# Patient Record
Sex: Female | Born: 1960 | Race: White | Hispanic: No | Marital: Married | Smoking: Former smoker
Health system: Southern US, Community
[De-identification: ages and names within clinical notes are randomized; demographics above are authoritative.]

## PROBLEM LIST (undated history)

## (undated) ENCOUNTER — Ambulatory Visit: Source: Home / Self Care

## (undated) DIAGNOSIS — N979 Female infertility, unspecified: Secondary | ICD-10-CM

## (undated) DIAGNOSIS — L509 Urticaria, unspecified: Secondary | ICD-10-CM

## (undated) DIAGNOSIS — S62109A Fracture of unspecified carpal bone, unspecified wrist, initial encounter for closed fracture: Secondary | ICD-10-CM

## (undated) DIAGNOSIS — Z8739 Personal history of other diseases of the musculoskeletal system and connective tissue: Secondary | ICD-10-CM

## (undated) DIAGNOSIS — G4730 Sleep apnea, unspecified: Secondary | ICD-10-CM

## (undated) DIAGNOSIS — D499 Neoplasm of unspecified behavior of unspecified site: Secondary | ICD-10-CM

## (undated) DIAGNOSIS — H9190 Unspecified hearing loss, unspecified ear: Secondary | ICD-10-CM

## (undated) DIAGNOSIS — I872 Venous insufficiency (chronic) (peripheral): Secondary | ICD-10-CM

## (undated) DIAGNOSIS — F419 Anxiety disorder, unspecified: Secondary | ICD-10-CM

## (undated) DIAGNOSIS — M419 Scoliosis, unspecified: Secondary | ICD-10-CM

## (undated) DIAGNOSIS — S92909A Unspecified fracture of unspecified foot, initial encounter for closed fracture: Secondary | ICD-10-CM

## (undated) DIAGNOSIS — M7918 Myalgia, other site: Secondary | ICD-10-CM

## (undated) DIAGNOSIS — M8580 Other specified disorders of bone density and structure, unspecified site: Secondary | ICD-10-CM

## (undated) HISTORY — DX: Personal history of other diseases of the musculoskeletal system and connective tissue: Z87.39

## (undated) HISTORY — DX: Other specified disorders of bone density and structure, unspecified site: M85.80

## (undated) HISTORY — DX: Neoplasm of unspecified behavior of unspecified site: D49.9

## (undated) HISTORY — DX: Urticaria, unspecified: L50.9

## (undated) HISTORY — PX: CYSTECTOMY: SUR359

## (undated) HISTORY — DX: Anxiety disorder, unspecified: F41.9

## (undated) HISTORY — DX: Fracture of unspecified carpal bone, unspecified wrist, initial encounter for closed fracture: S62.109A

## (undated) HISTORY — PX: VEIN LIGATION AND STRIPPING: SHX2653

## (undated) HISTORY — PX: DILATION AND CURETTAGE OF UTERUS: SHX78

## (undated) HISTORY — DX: Scoliosis, unspecified: M41.9

## (undated) HISTORY — PX: TONSILLECTOMY: SUR1361

## (undated) HISTORY — DX: Female infertility, unspecified: N97.9

## (undated) HISTORY — PX: BREAST CYST EXCISION: SHX579

## (undated) HISTORY — DX: Unspecified fracture of unspecified foot, initial encounter for closed fracture: S92.909A

## (undated) HISTORY — PX: MOUTH SURGERY: SHX715

---

## 2007-03-25 ENCOUNTER — Emergency Department (HOSPITAL_COMMUNITY): Admission: EM | Admit: 2007-03-25 | Discharge: 2007-03-26 | Payer: Self-pay | Admitting: Emergency Medicine

## 2007-05-17 ENCOUNTER — Other Ambulatory Visit: Admission: RE | Admit: 2007-05-17 | Discharge: 2007-05-17 | Payer: Self-pay | Admitting: Obstetrics and Gynecology

## 2008-06-19 ENCOUNTER — Other Ambulatory Visit: Admission: RE | Admit: 2008-06-19 | Discharge: 2008-06-19 | Payer: Self-pay | Admitting: Obstetrics and Gynecology

## 2010-05-20 HISTORY — PX: FOOT SURGERY: SHX648

## 2010-12-24 ENCOUNTER — Encounter (INDEPENDENT_AMBULATORY_CARE_PROVIDER_SITE_OTHER): Payer: BC Managed Care – PPO | Admitting: Vascular Surgery

## 2010-12-24 DIAGNOSIS — I83893 Varicose veins of bilateral lower extremities with other complications: Secondary | ICD-10-CM

## 2010-12-25 NOTE — Consult Note (Signed)
NEW PATIENT CONSULTATION  Kristin Bass, Kristin Bass DOB:  02-14-1961                                       12/24/2010 ZOXWR#:[id number]  HISTORY OF PRESENT ILLNESS:  The patient presents today for evaluation of lower extremity discomfort.  She is a very pleasant, healthy, 50 year old female with recurrent varicose veins, bilateral lower extremities. She had treatment in [place], [place] 10 many years ago, she felt that she had some vein stripping although this apparently is not the case on physical exam.  She reports that she had what sounds like potential superficial thrombophlebitis at that time and treatment related to this.  She is certainly not clear on the details of these treatments.  PAST MEDICAL HISTORY:  Negative for any major medical difficulties.  She does report discomfort over these varicosities.  Also reports a twitching sensation and an aching in her calves.  PAST SURGICAL HISTORY:  Recent foot surgery on the left and a history of D and Cs, and tooth extraction.  SOCIAL HISTORY:  She is married.  She quit smoking 20 years ago.  She does not drink alcohol.  FAMILY HISTORY:  Negative for premature cirrhotic disease.  REVIEW OF SYSTEMS:  No weight loss or gain.  She is 5 feet 8 inches tall. VASCULAR:  Positive for pain with walking and prolonged standing. NEURO:  She does have a history of anxiety. Review of systems otherwise negative.  PHYSICAL EXAMINATION:  General:  Well-developed, well-nourished white female appearing stated age in no acute distress.  Vital Signs:  Blood pressure 149/60, pulse 72, respirations 18.  HEENT:  Normal. Extremities:  She has 2+ radial and 2+ dorsalis pedis pulses. Musculoskeletal:  No major deformities or cyanosis.  Neurologic:  No focal weakness or paresthesias.  Skin:  Without ulcers or rash. 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  ASSESSMENT AND PLAN:  She underwent formal venous duplex.  She underwent a screening duplex ultrasound by myself with the [employer].  This does not appear to show any communication between her saphenous vein and these varicosities.  I had a long discussion with her and I explained that these are not dangerous and do not present any severe health risk. She is having discomfort related to this.  Therefore, we have given her a prescription today for 20 to 30 mmHg thigh-high graduated compression garments.  I have instructed her on the use of these.  We plan to see her again in 3 months for formal duplex and continued discussion to determine if she would potentially have a treatment option available. We will discuss this further in 3 months.    Larina Earthly, M.D. Electronically Signed  TFE/MEDQ  D:  12/24/2010  T:  12/25/2010  Job:  [id number]

## 2011-03-25 ENCOUNTER — Ambulatory Visit: Payer: BC Managed Care – PPO | Admitting: Vascular Surgery

## 2013-01-04 ENCOUNTER — Encounter: Payer: Self-pay | Admitting: Certified Nurse Midwife

## 2013-01-09 ENCOUNTER — Ambulatory Visit (INDEPENDENT_AMBULATORY_CARE_PROVIDER_SITE_OTHER): Payer: BC Managed Care – PPO | Admitting: Certified Nurse Midwife

## 2013-01-09 ENCOUNTER — Encounter: Payer: Self-pay | Admitting: Certified Nurse Midwife

## 2013-01-09 VITALS — BP 104/64 | HR 68 | Resp 16 | Ht 68.5 in | Wt 186.0 lb

## 2013-01-09 DIAGNOSIS — Z01419 Encounter for gynecological examination (general) (routine) without abnormal findings: Secondary | ICD-10-CM

## 2013-01-09 DIAGNOSIS — N951 Menopausal and female climacteric states: Secondary | ICD-10-CM

## 2013-01-09 DIAGNOSIS — Z0000 Encounter for general adult medical examination without abnormal findings: Secondary | ICD-10-CM

## 2013-01-09 LAB — POCT URINALYSIS DIPSTICK
Bilirubin, UA: NEGATIVE
Blood, UA: NEGATIVE
Glucose, UA: NEGATIVE
Ketones, UA: NEGATIVE
Leukocytes, UA: NEGATIVE
Nitrite, UA: NEGATIVE
Protein, UA: NEGATIVE
Urobilinogen, UA: NEGATIVE
pH, UA: 5

## 2013-01-09 NOTE — Progress Notes (Signed)
52 y.o. [id number] Single Caucasian Fe here for annual exam.  Menopausal, no HRT.  Reports no vaginal bleeding or vaginal dryness. No menses or spotting since 01-18-12. Has stopped sexual activity with spouse due cooperation issues.  She has tried vibrator and has no pain, it is only with his ED device pump.  Currently in physical therapy for back, working well.  Sees PCP for aex, labs and medication management.  No other health issues today.  Patient's last menstrual period was 01/18/2012.          Sexually active: no  The current method of family planning is vasectomy.    Exercising: yes  pool therapy Smoker:  no  Health Maintenance: Pap:  10-20-11 neg HPV HR neg MMG:  01-14-12 normal Colonoscopy:  01-27-12 BMD:   none TDaP:  2014 Labs: Poct urine-neg Self breast exam: not done   reports that she has quit smoking. She does not have any smokeless tobacco history on file. She reports that she does not drink alcohol or use illicit drugs.  Past Medical History  Diagnosis Date  . Anxiety   . Wrist fracture   . Infertility, female     Past Surgical History  Procedure Laterality Date  . Breast cyst excision Left   . Foot surgery Left 11/11    hammer toe & plantar fascitis  . Dilation and curettage of uterus      times 2  . Cystectomy      from neck  . Vein ligation and stripping      Current Outpatient Prescriptions  Medication Sig Dispense Refill  . CALCIUM PO Take 1,200 mg by mouth daily. Plus vitamin d      . Chlorpheniramine Maleate (ALLERGY PO) Take by mouth daily.      . citalopram (CELEXA) 10 MG tablet Take 10 mg by mouth daily.      Tery Sanfilippo Calcium (STOOL SOFTENER PO) Take by mouth daily.       . Fiber TABS Take by mouth daily.       . Fluticasone Propionate (FLONASE NA) Place into the nose as needed.       . Glucosamine HCl (GLUCOSAMINE PO) Take by mouth daily.      . montelukast (SINGULAIR) 10 MG tablet daily.      . Omega-3 Fatty Acids (FISH OIL PO) Take by mouth  daily.       No current facility-administered medications for this visit.    Family History  Problem Relation Age of Onset  . Cancer Mother     colon  . Hypertension Father   . Anxiety disorder Father   . Diabetes Maternal Grandfather     ROS:  Pertinent items are noted in HPI.  Otherwise, a comprehensive ROS was negative.  Exam:   BP 104/64  Pulse 68  Resp 16  Ht 5' 8.5" (1.74 m)  Wt 186 lb (84.369 kg)  BMI 27.87 kg/m2  LMP 01/18/2012 Height: 5' 8.5" (174 cm)  Ht Readings from Last 3 Encounters:  01/09/13 5' 8.5" (1.74 m)    General appearance: alert, cooperative and appears stated age Head: Normocephalic, without obvious abnormality, atraumatic Neck: no adenopathy, supple, symmetrical, trachea midline and thyroid normal to inspection and palpation Lungs: clear to auscultation bilaterally Breasts: normal appearance, no masses or tenderness, No nipple retraction or dimpling, No nipple discharge or bleeding, No axillary or supraclavicular adenopathy Heart: regular rate and rhythm Abdomen: soft, non-tender; no masses,  no organomegaly Extremities: extremities normal, atraumatic, no  cyanosis or edema Skin: Skin color, texture, turgor normal. No rashes or lesions Lymph nodes: Cervical, supraclavicular, and axillary nodes normal. No abnormal inguinal nodes palpated Neurologic: Grossly normal   Pelvic: External genitalia:  no lesions              Urethra:  normal appearing urethra with no masses, tenderness or lesions              Bartholin's and Skene's: normal                 Vagina: normal appearing vagina with normal color and discharge, no lesions              Cervix: normal, non tender              Pap taken: no Bimanual Exam:  Uterus:  normal size, contour, position, consistency, mobility, non-tender and anteverted              Adnexa: normal adnexa and no mass, fullness, tenderness               Rectovaginal: Confirms               Anus:  normal sphincter tone, no  lesions  A:  Well Woman with normal exam  Menopausal  Back strain under physical therapy  Now  Social Stress with spouse  History of hyperplastic colon polyp colonscopy scheduled.   Family history of colon cancer, mother  P:  Reviewed health and wellness pertinent to exam  Discussed importance of evaluation if vaginal bleeding now will check [facility]  Continue follow up as indicated  Keep appoint for colonscopy  Discussed possible counseling for social stress, patient will decide   Pap smear as per guidelines   Mammogram yearly pap smear not taken today  counseled on breast self exam, mammography screening, menopause, adequate intake of calcium and vitamin D, diet and exercise, Kegel's exercises  return annually or prn  An After Visit Summary was printed and given to the patient.  Reviewed, TL

## 2013-01-09 NOTE — Patient Instructions (Signed)

## 2013-01-10 LAB — FOLLICLE STIMULATING HORMONE: FSH: 106.9 m[IU]/mL

## 2013-01-23 ENCOUNTER — Encounter (HOSPITAL_COMMUNITY): Payer: Self-pay | Admitting: Emergency Medicine

## 2013-01-23 ENCOUNTER — Emergency Department (HOSPITAL_COMMUNITY)
Admission: EM | Admit: 2013-01-23 | Discharge: 2013-01-23 | Disposition: A | Discharge status: Home / Self Care | Payer: BC Managed Care – PPO | Attending: Emergency Medicine | Admitting: Emergency Medicine

## 2013-01-23 DIAGNOSIS — M549 Dorsalgia, unspecified: Secondary | ICD-10-CM | POA: Insufficient documentation

## 2013-01-23 DIAGNOSIS — R209 Unspecified disturbances of skin sensation: Secondary | ICD-10-CM | POA: Insufficient documentation

## 2013-01-23 DIAGNOSIS — R42 Dizziness and giddiness: Secondary | ICD-10-CM | POA: Insufficient documentation

## 2013-01-23 DIAGNOSIS — M79609 Pain in unspecified limb: Secondary | ICD-10-CM | POA: Insufficient documentation

## 2013-01-23 DIAGNOSIS — F411 Generalized anxiety disorder: Secondary | ICD-10-CM | POA: Insufficient documentation

## 2013-01-23 DIAGNOSIS — M6281 Muscle weakness (generalized): Secondary | ICD-10-CM | POA: Insufficient documentation

## 2013-01-23 DIAGNOSIS — Z881 Allergy status to other antibiotic agents status: Secondary | ICD-10-CM | POA: Insufficient documentation

## 2013-01-23 DIAGNOSIS — N951 Menopausal and female climacteric states: Secondary | ICD-10-CM | POA: Insufficient documentation

## 2013-01-23 DIAGNOSIS — Z87891 Personal history of nicotine dependence: Secondary | ICD-10-CM | POA: Insufficient documentation

## 2013-01-23 DIAGNOSIS — M79604 Pain in right leg: Secondary | ICD-10-CM

## 2013-01-23 DIAGNOSIS — Z79899 Other long term (current) drug therapy: Secondary | ICD-10-CM | POA: Insufficient documentation

## 2013-01-23 LAB — CBC
HCT: 41.2 % (ref 36.0–46.0)
Hemoglobin: 14 g/dL (ref 12.0–15.0)
MCH: 31 pg (ref 26.0–34.0)
MCHC: 34 g/dL (ref 30.0–36.0)
MCV: 91.4 fL (ref 78.0–100.0)
Platelets: 218 10*3/uL (ref 150–400)
RBC: 4.51 MIL/uL (ref 3.87–5.11)
RDW: 12.7 % (ref 11.5–15.5)
WBC: 5 10*3/uL (ref 4.0–10.5)

## 2013-01-23 LAB — COMPREHENSIVE METABOLIC PANEL
ALT: 21 U/L (ref 0–35)
AST: 20 U/L (ref 0–37)
Albumin: 3.6 g/dL (ref 3.5–5.2)
Alkaline Phosphatase: 84 U/L (ref 39–117)
BUN: 12 mg/dL (ref 6–23)
CO2: 31 mEq/L (ref 19–32)
Calcium: 9.5 mg/dL (ref 8.4–10.5)
Chloride: 107 mEq/L (ref 96–112)
Creatinine, Ser: 0.71 mg/dL (ref 0.50–1.10)
GFR calc Af Amer: >90 mL/min (ref >90)
GFR calc non Af Amer: >90 mL/min (ref >90)
Glucose, Bld: 96 mg/dL (ref 70–99)
Potassium: 4.2 mEq/L (ref 3.5–5.1)
Sodium: 143 mEq/L (ref 135–145)
Total Bilirubin: 0.3 mg/dL (ref 0.3–1.2)
Total Protein: 6.7 g/dL (ref 6.0–8.3)

## 2013-01-23 LAB — CK: Total CK: 85 U/L (ref 7–177)

## 2013-01-23 NOTE — ED Notes (Signed)
  EMS stated, called for dizziness and leg pain, just started menopause with hot flashes.  The dizziness comes and goes.  Pt is wearing compression stockings.

## 2013-01-23 NOTE — ED Provider Notes (Signed)
History    CSN: [id number] Arrival date & time 01/23/13  0909  First MD Initiated Contact with Patient 01/23/13 [phone]     Chief Complaint  Patient presents with  . Dizziness  . Leg Pain   (Consider location/radiation/quality/duration/timing/severity/associated sxs/prior Treatment) HPI Comments: Pt is a 52yo female presenting with dizziness for several days and leg discomfort for several weeks. History is somewhat vague.  Dizziness: started about 4 days ago, comes in waves "off and on" lasting for several seconds. Does not describe room spinning sensation. States she sometimes feels like she is "going to pass out." Able to ambulate. Unclear whether she has palpitations with this or not. Has not fallen or lost consciousness.  Leg discomfort: not truly pain. Pt describes sensation of heaviness, worse with standing/not having her legs put up. Pt describes some "weakness standing up to get off the toilet," occasionally. Some tingling of her feet. Unable to state clearly whether or not she has swelling. Has worn some TED hose at home given by a family member with ?little improvement.  Otherwise feels well. Hx of myofascial pain syndrome with back pain, seeing PT. On Celexa for hormonal imbalance, recently "going through menopause." Has an appointment   The history is provided by the patient. No language interpreter was used.   Past Medical History  Diagnosis Date  . Anxiety   . Wrist fracture   . Infertility, female    Past Surgical History  Procedure Laterality Date  . Breast cyst excision Left   . Foot surgery Left 11/11    hammer toe & plantar fascitis  . Dilation and curettage of uterus      times 2  . Cystectomy      from neck  . Vein ligation and stripping     Family History  Problem Relation Age of Onset  . Cancer Mother     colon  . Hypertension Father   . Anxiety disorder Father   . Diabetes Maternal Grandfather    History  Substance Use Topics  . Smoking status:  Former Games developer  . Smokeless tobacco: Not on file  . Alcohol Use: No   OB History   Grav Para Term Preterm Abortions TAB SAB Ect Mult Living   2    2   2   0     Obstetric Comments   1 adopted children     Review of Systems  Constitutional: Positive for activity change. Negative for fever, chills and appetite change.  HENT: Negative for congestion, sore throat, rhinorrhea and neck stiffness.   Respiratory: Negative for choking, shortness of breath and wheezing.   Cardiovascular: Negative for chest pain and leg swelling.  Gastrointestinal: Negative for nausea, vomiting and abdominal pain.  Endocrine: Negative for polyphagia and polyuria.  Genitourinary: Negative for dysuria, urgency, hematuria, vaginal discharge, difficulty urinating and pelvic pain.  Musculoskeletal: Positive for myalgias and back pain.       Back pain at baseline, "uncomfortable ED bed"  Skin: Negative for rash and wound.  Neurological: Positive for dizziness and weakness. Negative for seizures, syncope, light-headedness and numbness.  Psychiatric/Behavioral: Negative for behavioral problems and confusion.    Allergies  Ciprofloxacin and Macrobid  Home Medications   Current Outpatient Rx  Name  Route  Sig  Dispense  Refill  . Calcium Carbonate-Vitamin D (CALCIUM + D PO)   Oral   Take 1 tablet by mouth daily.         . citalopram (CELEXA) 10 MG tablet  Oral   Take 10 mg by mouth daily.         Tery Sanfilippo Calcium (STOOL SOFTENER PO)   Oral   Take 1 capsule by mouth daily.          . Fiber TABS   Oral   Take 1 tablet by mouth daily.          . Fluticasone Propionate (FLONASE NA)   Nasal   Place 1 spray into the nose daily as needed (allergies).          . Glucosamine HCl (GLUCOSAMINE PO)   Oral   Take 1 tablet by mouth daily.          . montelukast (SINGULAIR) 10 MG tablet   Oral   Take 10 mg by mouth daily.          . Omega-3 Fatty Acids (FISH OIL PO)   Oral   Take 1 capsule  by mouth daily.          Marland Kitchen OVER THE COUNTER MEDICATION   Oral   Take 1 tablet by mouth daily. Allergy medication         . oxymetazoline (AFRIN) 0.05 % nasal spray   Nasal   Place 2 sprays into the nose 2 (two) times daily as needed for congestion.          BP 118/61  Pulse 69  Temp(Src) 97.8 F (36.6 C) (Oral)  Resp 18  SpO2 100%  LMP 01/18/2012 Physical Exam  Nursing note and vitals reviewed. Constitutional: She is oriented to person, place, and time. She appears well-developed and well-nourished. No distress.  HENT:  Head: Normocephalic and atraumatic.  Eyes: Conjunctivae are normal. Pupils are equal, round, and reactive to light.  Neck: Normal range of motion. Neck supple.  Cardiovascular: Normal rate, regular rhythm and normal heart sounds.   No murmur heard. Pulmonary/Chest: Effort normal and breath sounds normal. No respiratory distress. She has no wheezes.  Abdominal: Soft. Bowel sounds are normal. She exhibits no distension. There is no tenderness.  Musculoskeletal: Normal range of motion. She exhibits no edema and no tenderness.       Right hip: She exhibits normal range of motion and normal strength.       Left hip: She exhibits normal range of motion and normal strength.       Right ankle: Normal.       Left ankle: Normal.       Right upper leg: She exhibits no tenderness and no swelling.       Left upper leg: She exhibits no tenderness and no swelling.       Right lower leg: She exhibits no tenderness and no swelling.       Left lower leg: She exhibits no tenderness and no swelling.       Right foot: She exhibits no tenderness and normal capillary refill.       Left foot: She exhibits no tenderness and normal capillary refill.  Normal ROM to all joints of LE, no LE swelling appreciated, no calf redness/tenderness  Neurological: She is alert and oriented to person, place, and time. She has normal strength. She displays no atrophy and no tremor. No cranial  nerve deficit or sensory deficit.  Skin: Skin is warm and dry. No rash noted. She is not diaphoretic. No erythema.  Psychiatric: She has a normal mood and affect. Her behavior is normal.    ED Course  Procedures (including critical care time)  0945: Exam and HPI as above, vague complaints with unremarkable physical exam. Will check CBC, CMP, CK.  1115: Labs unremarkable. Pt still with chronic back pain but no new complaints. Family member suggested gabapentin for leg pain/discomfort, but hesitant to Rx this due to side effects of drowsiness/dizziness. Pt okay with deferring this to PCP.  1130: Pt comfortable going home with close vein specialist follow-up (appt with Dr. Jimmie Molly moved from later this month to tomorrow 10:30 AM). Will defer other med changes to PCP.  Labs Reviewed  CBC  COMPREHENSIVE METABOLIC PANEL  CK   No results found. 1. Dizziness   2. Bilateral leg pain     MDM  52yo female with vague dizziness and bilateral leg discomfort. Labs and vitals all WNL, as above. Arranged close f/u with vein specialist and advised close PCP f/u. Could consider gabapentin as an outpt.  The above was discussed in its entirety with attending ED physician Dr. Radford Pax.   Bobbye Morton, MD  PGY-2, [facility]  Bobbye Morton, MD 01/23/13 [phone]

## 2013-01-24 NOTE — ED Provider Notes (Signed)
I saw and evaluated the patient, reviewed the resident's note and I agree with the findings and plan.   .Face to face Exam:  General:  Awake HEENT:  Atraumatic Resp:  Normal effort Abd:  Nondistended Neuro:No focal weakness   Nelia Shi, MD 01/24/13 1656

## 2013-01-25 ENCOUNTER — Emergency Department (HOSPITAL_COMMUNITY)
Admission: EM | Admit: 2013-01-25 | Discharge: 2013-01-25 | Discharge status: Against Medical Advice | Payer: BC Managed Care – PPO | Attending: Emergency Medicine | Admitting: Emergency Medicine

## 2013-01-25 ENCOUNTER — Encounter (HOSPITAL_COMMUNITY): Payer: Self-pay | Admitting: Emergency Medicine

## 2013-01-25 NOTE — ED Provider Notes (Signed)
Pt placed in room via EPIC but apparently was not in the department Chart entered in error  Joya Gaskins, MD 01/25/13 2001

## 2013-01-25 NOTE — ED Notes (Signed)
Per EMS, Pt started having 10/10 left sided non-radiating chest pain this evening. Pt denies n/v but does have SOB. Pt took one nitro at home and states it came down to an 8/10. Pt did not take ASA because pt states "I can't take it, it makes me bleed." Pt has history of MI in 1989. BP 118/78, HR 80 NSR.

## 2013-01-25 NOTE — ED Notes (Signed)
Pt was put in with EMS information for Pod A room 5 but it was the wrong pt. Information from 19:10-19:58 is for a different pt.

## 2013-05-25 ENCOUNTER — Other Ambulatory Visit: Payer: Self-pay

## 2013-06-12 ENCOUNTER — Emergency Department (HOSPITAL_COMMUNITY)
Admission: EM | Admit: 2013-06-12 | Discharge: 2013-06-12 | Disposition: A | Discharge status: Home / Self Care | Payer: Worker's Compensation | Attending: Emergency Medicine | Admitting: Emergency Medicine

## 2013-06-12 ENCOUNTER — Encounter (HOSPITAL_COMMUNITY): Payer: Self-pay | Admitting: Emergency Medicine

## 2013-06-12 DIAGNOSIS — Z881 Allergy status to other antibiotic agents status: Secondary | ICD-10-CM | POA: Insufficient documentation

## 2013-06-12 DIAGNOSIS — Z79899 Other long term (current) drug therapy: Secondary | ICD-10-CM | POA: Insufficient documentation

## 2013-06-12 DIAGNOSIS — Y9301 Activity, walking, marching and hiking: Secondary | ICD-10-CM | POA: Insufficient documentation

## 2013-06-12 DIAGNOSIS — S63509A Unspecified sprain of unspecified wrist, initial encounter: Secondary | ICD-10-CM | POA: Insufficient documentation

## 2013-06-12 DIAGNOSIS — W19XXXA Unspecified fall, initial encounter: Secondary | ICD-10-CM

## 2013-06-12 DIAGNOSIS — Z87891 Personal history of nicotine dependence: Secondary | ICD-10-CM | POA: Insufficient documentation

## 2013-06-12 DIAGNOSIS — Y92009 Unspecified place in unspecified non-institutional (private) residence as the place of occurrence of the external cause: Secondary | ICD-10-CM | POA: Insufficient documentation

## 2013-06-12 DIAGNOSIS — IMO0002 Reserved for concepts with insufficient information to code with codable children: Secondary | ICD-10-CM | POA: Insufficient documentation

## 2013-06-12 DIAGNOSIS — R6884 Jaw pain: Secondary | ICD-10-CM | POA: Insufficient documentation

## 2013-06-12 DIAGNOSIS — F411 Generalized anxiety disorder: Secondary | ICD-10-CM | POA: Insufficient documentation

## 2013-06-12 DIAGNOSIS — W108XXA Fall (on) (from) other stairs and steps, initial encounter: Secondary | ICD-10-CM | POA: Insufficient documentation

## 2013-06-12 DIAGNOSIS — T07XXXA Unspecified multiple injuries, initial encounter: Secondary | ICD-10-CM

## 2013-06-12 NOTE — ED Notes (Signed)
Pt tumbled down 5-7 steps and did hit head.  No LOC.  Pt feels like right jaw is not the same but it does not hurt.  Pt complains of right knee but it does not hurt

## 2013-06-12 NOTE — ED Provider Notes (Signed)
CSN: [id number]     Arrival date & time 06/12/13  1848 History   First MD Initiated Contact with Patient 06/12/13 2152     Chief Complaint  Patient presents with  . Fall   HPI  History provided by the patient. Patient is a 52 year old female who presents with pain injuries after a fall. Patient states that she was leaving her house on the way to her car and stumbled down the steps. She reports falling down approximately 4 or 5 steps made of cement. She landed primarily on her left upper extremity and left lower leg. She denies significant head injury but did bump her head on the right forehead. There was no LOC. She denies any significant pains. No neck or back pain. She does complain of some pain in her right jaw area. She does not recall hitting the jaw but it does hurt to open and close the mouth. She also complains of pains in her left hand. Pains are slightly worse with making a fist. Denies any swelling or deformities. No numbness. Patient has not used any medications for symptoms. No other aggravating or alleviating factors. No other associated symptoms.    Past Medical History  Diagnosis Date  . Anxiety   . Wrist fracture   . Infertility, female    Past Surgical History  Procedure Laterality Date  . Breast cyst excision Left   . Foot surgery Left 11/11    hammer toe & plantar fascitis  . Dilation and curettage of uterus      times 2  . Cystectomy      from neck  . Vein ligation and stripping     Family History  Problem Relation Age of Onset  . Cancer Mother     colon  . Hypertension Father   . Anxiety disorder Father   . Diabetes Maternal Grandfather    History  Substance Use Topics  . Smoking status: Former Games developer  . Smokeless tobacco: Not on file  . Alcohol Use: No   OB History   Grav Para Term Preterm Abortions TAB SAB Ect Mult Living   2    2   2   0     Obstetric Comments   1 adopted children     Review of Systems  Respiratory: Negative for shortness  of breath.   Cardiovascular: Negative for chest pain.  Musculoskeletal: Negative for back pain and neck pain.  Neurological: Negative for weakness, numbness and headaches.  All other systems reviewed and are negative.    Allergies  Ciprofloxacin; Macrobid; and Septra  Home Medications   Current Outpatient Rx  Name  Route  Sig  Dispense  Refill  . acetaminophen (TYLENOL) 500 MG tablet   Oral   Take 500 mg by mouth every 8 (eight) hours as needed for mild pain.         . Calcium Carbonate-Vitamin D (CALCIUM + D PO)   Oral   Take 1 tablet by mouth daily.         . citalopram (CELEXA) 10 MG tablet   Oral   Take 15 mg by mouth at bedtime.          Tery Sanfilippo Calcium (STOOL SOFTENER PO)   Oral   Take 1 capsule by mouth daily.          Marland Kitchen doxycycline (VIBRAMYCIN) 100 MG capsule   Oral   Take 100 mg by mouth 2 (two) times daily.         Marland Kitchen  Fiber TABS   Oral   Take 1 tablet by mouth daily.          . fluticasone (FLONASE) 50 MCG/ACT nasal spray   Each Nare   Place 1 spray into both nostrils daily as needed for allergies or rhinitis.         . Glucosamine HCl (GLUCOSAMINE PO)   Oral   Take 1 tablet by mouth daily.          . montelukast (SINGULAIR) 10 MG tablet   Oral   Take 10 mg by mouth at bedtime as needed (for allergies).         . Omega-3 Fatty Acids (FISH OIL PO)   Oral   Take 1 capsule by mouth daily.          Marland Kitchen zolpidem (AMBIEN) 5 MG tablet   Oral   Take 2.5 mg by mouth at bedtime as needed for sleep.          BP 120/63  Pulse 71  Temp(Src) 97.7 F (36.5 C) (Oral)  Resp 18  SpO2 98%  LMP 01/18/2012 Physical Exam  Nursing note and vitals reviewed. Constitutional: She is oriented to person, place, and time. She appears well-developed and well-nourished. No distress.  HENT:  Head: Normocephalic.  Mouth/Throat: Oropharynx is clear and moist.  Tenderness to palpation over bilateral TMJs. There is some popping and clicking at the  left TMJ. No facial swelling or deformities. No step-offs. Normal dentition. No trismus.  Small contusion to the right forehead without large hematoma. No step-offs worse concerns for skull fracture  Eyes: Conjunctivae and EOM are normal. Pupils are equal, round, and reactive to light.  Neck: Normal range of motion. Neck supple.  No cervical midline tenderness  Cardiovascular: Normal rate and regular rhythm.   Pulmonary/Chest: Effort normal and breath sounds normal. No stridor. No respiratory distress. She has no wheezes. She has no rales.  Abdominal: Soft. There is no tenderness.  Musculoskeletal: Normal range of motion.  A small abrasion over the posterior left forearm. There is mild contusion to the left hand near the second metacarpal. No swelling or deformities. Normal range of motion of the wrist and digits. Normal distal sensations and capillary refill. No weakness.  Small abrasion and hematoma to that proximal anterior left lower leg and shin. No deformity of tibia or any step-offs. Normal gait. Normal distal pulses and sensation in both feet.  Neurological: She is alert and oriented to person, place, and time. She has normal strength. No cranial nerve deficit or sensory deficit. Gait normal.  Skin: Skin is warm and dry. No rash noted.  Psychiatric: She has a normal mood and affect. Her behavior is normal.    ED Course  Procedures  DIAGNOSTIC STUDIES: Oxygen Saturation is 98% on room air.    COORDINATION OF CARE:  Nursing notes reviewed. Vital signs reviewed. Initial pt interview and examination performed.   10:36 PM- patient seen and evaluated. She appears well with normal nonfocal neuro exam. I discussed options for x-rays of her injured in painful areas including the hand, lower leg and jaw area. At this time she does not wish to have any x-rays performed. There are no significant findings on exam tunicate high concern for fracture. We discussed other treatment options  including rest, ice and anti-inflammatory pain medicine. Patient agrees with this plan and will followup with PCP as needed.      MDM   1. Fall, initial encounter   2. Multiple contusions  3. Abrasions of multiple sites   4. Wrist sprain and strain, left, initial encounter        Angus Seller, PA-C 06/13/13 [phone]

## 2013-06-12 NOTE — ED Notes (Signed)
Orthopedic called.

## 2013-06-15 NOTE — ED Provider Notes (Signed)
Medical screening examination/treatment/procedure(s) were performed by non-physician practitioner and as supervising physician I was immediately available for consultation/collaboration.  EKG Interpretation   None        Avyukt Cimo R. Jenkins Risdon, MD 06/15/13 1056 

## 2013-07-18 ENCOUNTER — Other Ambulatory Visit: Payer: Self-pay | Admitting: Family Medicine

## 2013-07-18 ENCOUNTER — Ambulatory Visit
Admission: RE | Admit: 2013-07-18 | Discharge: 2013-07-18 | Disposition: A | Discharge status: Home / Self Care | Payer: PRIVATE HEALTH INSURANCE | Source: Ambulatory Visit | Attending: Family Medicine | Admitting: Family Medicine

## 2013-07-18 DIAGNOSIS — M79609 Pain in unspecified limb: Secondary | ICD-10-CM

## 2013-11-13 ENCOUNTER — Other Ambulatory Visit: Payer: Self-pay | Admitting: Family Medicine

## 2013-11-13 ENCOUNTER — Ambulatory Visit
Admission: RE | Admit: 2013-11-13 | Discharge: 2013-11-13 | Disposition: A | Discharge status: Home / Self Care | Payer: PRIVATE HEALTH INSURANCE | Source: Ambulatory Visit | Attending: Family Medicine | Admitting: Family Medicine

## 2013-11-13 DIAGNOSIS — M549 Dorsalgia, unspecified: Secondary | ICD-10-CM

## 2013-12-14 ENCOUNTER — Emergency Department (INDEPENDENT_AMBULATORY_CARE_PROVIDER_SITE_OTHER)
Admission: EM | Admit: 2013-12-14 | Discharge: 2013-12-14 | Disposition: A | Discharge status: Home / Self Care | Payer: BC Managed Care – PPO | Source: Home / Self Care | Attending: Emergency Medicine | Admitting: Emergency Medicine

## 2013-12-14 ENCOUNTER — Emergency Department (INDEPENDENT_AMBULATORY_CARE_PROVIDER_SITE_OTHER): Payer: BC Managed Care – PPO

## 2013-12-14 ENCOUNTER — Encounter (HOSPITAL_COMMUNITY): Payer: Self-pay | Admitting: Emergency Medicine

## 2013-12-14 DIAGNOSIS — W010XXA Fall on same level from slipping, tripping and stumbling without subsequent striking against object, initial encounter: Secondary | ICD-10-CM

## 2013-12-14 DIAGNOSIS — S8000XA Contusion of unspecified knee, initial encounter: Secondary | ICD-10-CM

## 2013-12-14 HISTORY — DX: Myalgia, other site: M79.18

## 2013-12-14 MED ORDER — IBUPROFEN 800 MG PO TABS
800.0000 mg | ORAL_TABLET | Freq: Once | ORAL | Status: AC
  Administered 2013-12-14: 800 mg via ORAL

## 2013-12-14 MED ORDER — HYDROCODONE-ACETAMINOPHEN 5-325 MG PO TABS: 1.0000 | ORAL_TABLET | Freq: Four times a day (QID) | ORAL | Status: DC | PRN

## 2013-12-14 MED ORDER — IBUPROFEN 800 MG PO TABS: 800.0000 mg | ORAL_TABLET | Freq: Three times a day (TID) | ORAL | Status: DC

## 2013-12-14 MED ORDER — IBUPROFEN 800 MG PO TABS
ORAL_TABLET | ORAL | Status: AC
  Filled 2013-12-14: qty 1

## 2013-12-14 NOTE — Discharge Instructions (Signed)
Contusion A contusion is a deep bruise. Contusions are the result of an injury that caused bleeding under the skin. The contusion may turn blue, purple, or yellow. Minor injuries will give you a painless contusion, but more severe contusions may stay painful and swollen for a few weeks.  CAUSES  A contusion is usually caused by a blow, trauma, or direct force to an area of the body. SYMPTOMS   Swelling and redness of the injured area.  Bruising of the injured area.  Tenderness and soreness of the injured area.  Pain. DIAGNOSIS  The diagnosis can be made by taking a history and physical exam. An X-ray, CT scan, or MRI may be needed to determine if there were any associated injuries, such as fractures. TREATMENT  Specific treatment will depend on what area of the body was injured. In general, the best treatment for a contusion is resting, icing, elevating, and applying cold compresses to the injured area. Over-the-counter medicines may also be recommended for pain control. Ask your caregiver what the best treatment is for your contusion. HOME CARE INSTRUCTIONS   Put ice on the injured area.  Put ice in a plastic bag.  Place a towel between your skin and the bag.  Leave the ice on for 15-20 minutes, 03-04 times a day.  Only take over-the-counter or prescription medicines for pain, discomfort, or fever as directed by your caregiver. Your caregiver may recommend avoiding anti-inflammatory medicines (aspirin, ibuprofen, and naproxen) for 48 hours because these medicines may increase bruising.  Rest the injured area.  If possible, elevate the injured area to reduce swelling. SEEK IMMEDIATE MEDICAL CARE IF:   You have increased bruising or swelling.  You have pain that is getting worse.  Your swelling or pain is not relieved with medicines. MAKE SURE YOU:   Understand these instructions.  Will watch your condition.  Will get help right away if you are not doing well or get  worse. Document Released: 04/15/2005 Document Revised: 09/28/2011 Document Reviewed: 05/11/2011 [facility] Patient Information 2014 [place], [place].  Knee Effusion The medical term for having fluid in your knee is effusion. This is often due to an internal derangement of the knee. This means something is wrong inside the knee. Some of the causes of fluid in the knee may be torn cartilage, a torn ligament, or bleeding into the joint from an injury. Your knee is likely more difficult to bend and move. This is often because there is increased pain and pressure in the joint. The time it takes for recovery from a knee effusion depends on different factors, including:   Type of injury.  Your age.  Physical and medical conditions.  Rehabilitation Strategies. How long you will be away from your normal activities will depend on what kind of knee problem you have and how much damage is present. Your knee has two types of cartilage. Articular cartilage covers the bone ends and lets your knee bend and move smoothly. Two menisci, thick pads of cartilage that form a rim inside the joint, help absorb shock and stabilize your knee. Ligaments bind the bones together and support your knee joint. Muscles move the joint, help support your knee, and take stress off the joint itself. CAUSES  Often an effusion in the knee is caused by an injury to one of the menisci. This is often a tear in the cartilage. Recovery after a meniscus injury depends on how much meniscus is damaged and whether you have damaged other knee tissue.  Small tears may heal on their own with conservative treatment. Conservative means rest, limited weight bearing activity and muscle strengthening exercises. Your recovery may take up to 6 weeks.  TREATMENT  Larger tears may require surgery. Meniscus injuries may be treated during arthroscopy. Arthroscopy is a procedure in which your surgeon uses a small telescope like instrument to look in your knee.  Your caregiver can make a more accurate diagnosis (learning what is wrong) by performing an arthroscopic procedure. If your injury is on the inner margin of the meniscus, your surgeon may trim the meniscus back to a smooth rim. In other cases your surgeon will try to repair a damaged meniscus with stitches (sutures). This may make rehabilitation take longer, but may provide better long term result by helping your knee keep its shock absorption capabilities. Ligaments which are completely torn usually require surgery for repair. HOME CARE INSTRUCTIONS  Use crutches as instructed.  If a brace is applied, use as directed.  Once you are home, an ice pack applied to your swollen knee may help with discomfort and help decrease swelling.  Keep your knee raised (elevated) when you are not up and around or on crutches.  Only take over-the-counter or prescription medicines for pain, discomfort, or fever as directed by your caregiver.  Your caregivers will help with instructions for rehabilitation of your knee. This often includes strengthening exercises.  You may resume a normal diet and activities as directed. SEEK MEDICAL CARE IF:   There is increased swelling in your knee.  You notice redness, swelling, or increasing pain in your knee.  An unexplained oral temperature above 102 F (38.9 C) develops. SEEK IMMEDIATE MEDICAL CARE IF:   You develop a rash.  You have difficulty breathing.  You have any allergic reactions from medications you may have been given.  There is severe pain with any motion of the knee. MAKE SURE YOU:   Understand these instructions.  Will watch your condition.  Will get help right away if you are not doing well or get worse. Document Released: 09/26/2003 Document Revised: 09/28/2011 Document Reviewed: 11/30/2007 [facility] Patient Information 2014 [place].

## 2013-12-14 NOTE — ED Provider Notes (Signed)
CSN: [id number]     Arrival date & time 12/14/13  1819 History   None    Chief Complaint  Patient presents with  . Fall   (Consider location/radiation/quality/duration/timing/severity/associated sxs/prior Treatment) HPI Comments: 53 year old female presents complaining of knee pain, arm pain, and hip pain. This started just this afternoon. She was walking in a parking lot when she tripped and fell. She fell straight forward onto her hands and knees. She had immediate pain, and she has an abrasion on her right knee and some swelling on her left knee. She says that she has mild fascial pain syndrome and she thinks this will cause a flare up. She is able to walk without difficulty. Her knee is very tender to touch. No twisting or popping in the knee.  Patient is a 53 y.o. female presenting with fall.  Fall    Past Medical History  Diagnosis Date  . Anxiety   . Wrist fracture   . Infertility, female   . Myofascial pain syndrome    Past Surgical History  Procedure Laterality Date  . Breast cyst excision Left   . Foot surgery Left 11/11    hammer toe & plantar fascitis  . Dilation and curettage of uterus      times 2  . Cystectomy      from neck  . Vein ligation and stripping     Family History  Problem Relation Age of Onset  . Cancer Mother     colon  . Hypertension Father   . Anxiety disorder Father   . Diabetes Maternal Grandfather    History  Substance Use Topics  . Smoking status: Former Research scientist (life sciences)  . Smokeless tobacco: Not on file  . Alcohol Use: No   OB History   Grav Para Term Preterm Abortions TAB SAB Ect Mult Living   2    2   2   0     Obstetric Comments   1 adopted children     Review of Systems  Musculoskeletal: Positive for arthralgias, joint swelling and myalgias. Negative for back pain, gait problem, neck pain and neck stiffness.  Neurological: Negative for weakness and numbness.  All other systems reviewed and are negative.   Allergies   Ciprofloxacin; Macrobid; and Septra  Home Medications   Prior to Admission medications   Medication Sig Start Date End Date Taking? Authorizing Provider  Calcium Carbonate-Vitamin D (CALCIUM + D PO) Take 1 tablet by mouth daily.   Yes Historical Provider, MD  citalopram (CELEXA) 10 MG tablet Take 15 mg by mouth at bedtime.    Yes Historical Provider, MD  Docusate Calcium (STOOL SOFTENER PO) Take 1 capsule by mouth daily.    Yes Historical Provider, MD  Fiber TABS Take 1 tablet by mouth daily.    Yes Historical Provider, MD  Glucosamine HCl (GLUCOSAMINE PO) Take 1 tablet by mouth daily.    Yes Historical Provider, MD  montelukast (SINGULAIR) 10 MG tablet Take 10 mg by mouth at bedtime as needed (for allergies).   Yes Historical Provider, MD  acetaminophen (TYLENOL) 500 MG tablet Take 500 mg by mouth every 8 (eight) hours as needed for mild pain.    Historical Provider, MD  doxycycline (VIBRAMYCIN) 100 MG capsule Take 100 mg by mouth 2 (two) times daily.    Historical Provider, MD  fluticasone (FLONASE) 50 MCG/ACT nasal spray Place 1 spray into both nostrils daily as needed for allergies or rhinitis.    Historical Provider, MD  HYDROcodone-acetaminophen [facility])  5-325 MG per tablet Take 1 tablet by mouth every 6 (six) hours as needed for moderate pain. 12/14/13   Liam Graham, PA-C  ibuprofen (ADVIL,MOTRIN) 800 MG tablet Take 1 tablet (800 mg total) by mouth 3 (three) times daily. 12/14/13   Liam Graham, PA-C  Omega-3 Fatty Acids (FISH OIL PO) Take 1 capsule by mouth daily.     Historical Provider, MD  zolpidem (AMBIEN) 5 MG tablet Take 2.5 mg by mouth at bedtime as needed for sleep.    Historical Provider, MD   BP 105/65  Pulse 77  Temp(Src) 97 F (36.1 C) (Oral)  Resp 16  LMP 01/18/2012 Physical Exam  Nursing note and vitals reviewed. Constitutional: She is oriented to person, place, and time. Vital signs are normal. She appears well-developed and well-nourished. No distress.   HENT:  Head: Normocephalic and atraumatic.  Pulmonary/Chest: Effort normal. No respiratory distress.  Musculoskeletal:       Right wrist: Normal.       Left wrist: Normal.       Right knee: She exhibits laceration (Very mild abrasion on the inferior medial portion of the knee).       Left knee: She exhibits swelling (medial inferior portion of the knee) and effusion (Very minimal). She exhibits normal range of motion. Tenderness found. Patellar tendon tenderness noted.       Right hand: Normal.       Left hand: Normal.  Neurological: She is alert and oriented to person, place, and time. She has normal strength. Coordination normal.  Skin: Skin is warm and dry. No rash noted. She is not diaphoretic.  Psychiatric: She has a normal mood and affect. Judgment normal.    ED Course  Procedures (including critical care time) Labs Review Labs Reviewed - No data to display  Imaging Review Dg Knee Complete 4 Views Left  12/14/2013   CLINICAL DATA:  Fall, left knee pain  EXAM: LEFT KNEE - COMPLETE 4+ VIEW  COMPARISON:  None.  FINDINGS: Four views of the left knee submitted. No acute fracture or subluxation. No radiopaque foreign body.  IMPRESSION: Negative.   Electronically Signed   By: Lahoma Crocker M.D.   On: 12/14/2013 19:53     MDM   1. Knee contusion    Because of the small effusion, we will x-ray the left knee. The x-rays normal. Treat symptomatically. She has an orthopedic physician that she can followup with if she does not get better. She was given 800 mg ibuprofen here   Meds ordered this encounter  Medications  . ibuprofen (ADVIL,MOTRIN) tablet 800 mg    Sig:   . ibuprofen (ADVIL,MOTRIN) 800 MG tablet    Sig: Take 1 tablet (800 mg total) by mouth 3 (three) times daily.    Dispense:  30 tablet    Refill:  0    Order Specific Question:  Supervising Provider    Answer:  Billy Fischer [phone]  . HYDROcodone-acetaminophen (NORCO) 5-325 MG per tablet    Sig: Take 1 tablet by mouth  every 6 (six) hours as needed for moderate pain.    Dispense:  15 tablet    Refill:  0    Order Specific Question:  Supervising Provider    Answer:  Ihor Gully D [place], PA-C 12/14/13 [phone]

## 2013-12-14 NOTE — ED Notes (Addendum)
C/O tripping over pipes at daughter's school; "sliding" on bilat knees and bilat hands.  C/O pain to bilat knees, HA (but denies hitting head), generalized soreness.  No abrasions noted.

## 2013-12-14 NOTE — ED Provider Notes (Signed)
Medical screening examination/treatment/procedure(s) were performed by non-physician practitioner and as supervising physician I was immediately available for consultation/collaboration.  Philipp Deputy, M.D.   Harden Mo, MD 12/14/13 2103

## 2014-01-05 ENCOUNTER — Other Ambulatory Visit: Payer: Self-pay | Admitting: Family Medicine

## 2014-01-05 DIAGNOSIS — M545 Low back pain: Secondary | ICD-10-CM

## 2014-01-08 ENCOUNTER — Ambulatory Visit
Admission: RE | Admit: 2014-01-08 | Discharge: 2014-01-08 | Disposition: A | Discharge status: Home / Self Care | Payer: PRIVATE HEALTH INSURANCE | Source: Ambulatory Visit | Attending: Family Medicine | Admitting: Family Medicine

## 2014-01-08 DIAGNOSIS — M545 Low back pain: Secondary | ICD-10-CM

## 2014-03-03 ENCOUNTER — Emergency Department (HOSPITAL_COMMUNITY)
Admission: EM | Admit: 2014-03-03 | Discharge: 2014-03-03 | Disposition: A | Discharge status: Home / Self Care | Payer: BC Managed Care – PPO | Attending: Emergency Medicine | Admitting: Emergency Medicine

## 2014-03-03 ENCOUNTER — Encounter (HOSPITAL_COMMUNITY): Payer: Self-pay | Admitting: Emergency Medicine

## 2014-03-03 DIAGNOSIS — Z79899 Other long term (current) drug therapy: Secondary | ICD-10-CM | POA: Insufficient documentation

## 2014-03-03 DIAGNOSIS — Z8781 Personal history of (healed) traumatic fracture: Secondary | ICD-10-CM | POA: Insufficient documentation

## 2014-03-03 DIAGNOSIS — Z87891 Personal history of nicotine dependence: Secondary | ICD-10-CM | POA: Insufficient documentation

## 2014-03-03 DIAGNOSIS — F411 Generalized anxiety disorder: Secondary | ICD-10-CM | POA: Diagnosis not present

## 2014-03-03 DIAGNOSIS — Z791 Long term (current) use of non-steroidal anti-inflammatories (NSAID): Secondary | ICD-10-CM | POA: Insufficient documentation

## 2014-03-03 DIAGNOSIS — Z792 Long term (current) use of antibiotics: Secondary | ICD-10-CM | POA: Insufficient documentation

## 2014-03-03 DIAGNOSIS — IMO0001 Reserved for inherently not codable concepts without codable children: Secondary | ICD-10-CM | POA: Insufficient documentation

## 2014-03-03 DIAGNOSIS — G8929 Other chronic pain: Secondary | ICD-10-CM | POA: Diagnosis not present

## 2014-03-03 DIAGNOSIS — M549 Dorsalgia, unspecified: Secondary | ICD-10-CM | POA: Diagnosis present

## 2014-03-03 DIAGNOSIS — M25559 Pain in unspecified hip: Secondary | ICD-10-CM | POA: Diagnosis not present

## 2014-03-03 LAB — URINALYSIS, ROUTINE W REFLEX MICROSCOPIC
Bilirubin Urine: NEGATIVE
Glucose, UA: NEGATIVE mg/dL
Hgb urine dipstick: NEGATIVE
Ketones, ur: NEGATIVE mg/dL
Leukocytes, UA: NEGATIVE
Nitrite: NEGATIVE
Protein, ur: NEGATIVE mg/dL
Specific Gravity, Urine: 1.014 (ref 1.005–1.030)
Urobilinogen, UA: 0.2 mg/dL (ref 0.0–1.0)
pH: 7 (ref 5.0–8.0)

## 2014-03-03 MED ORDER — CYCLOBENZAPRINE HCL 10 MG PO TABS: 10.0000 mg | ORAL_TABLET | Freq: Three times a day (TID) | ORAL | Status: DC | PRN

## 2014-03-03 MED ORDER — IBUPROFEN 400 MG PO TABS
600.0000 mg | ORAL_TABLET | Freq: Once | ORAL | Status: AC
  Administered 2014-03-03: 600 mg via ORAL
  Filled 2014-03-03 (×2): qty 1

## 2014-03-03 MED ORDER — NAPROXEN 500 MG PO TABS: 500.0000 mg | ORAL_TABLET | Freq: Two times a day (BID) | ORAL | Status: DC | PRN

## 2014-03-03 MED ORDER — HYDROCODONE-ACETAMINOPHEN 5-325 MG PO TABS: 1.0000 | ORAL_TABLET | Freq: Four times a day (QID) | ORAL | Status: DC | PRN

## 2014-03-03 NOTE — Discharge Instructions (Signed)
Back Pain:  Your back pain should be treated with medicines such as ibuprofen or aleve and this back pain should get better over the next 2 weeks.  However if you develop severe or worsening pain, low back pain with fever, numbness, weakness or inability to walk or urinate, you should return to the ER immediately.  Please follow up with your doctor this week for a recheck if still having symptoms.  Low back pain is discomfort in the lower back that may be due to injuries to muscles and ligaments around the spine.  Occasionally, it may be caused by a a problem to a part of the spine called a disc.  The pain may last several days or a week;  However, most patients get completely well in 4 weeks.  Self - care:  The application of heat can help soothe the pain.  Maintaining your daily activities, including walking, is encourged, as it will help you get better faster than just staying in bed. Perform gentle stretching as discussed. Drink plenty of fluids.  Medications are also useful to help with pain control.  A commonly prescribed medications includes norco. Do not drive or operate heavy machinery while taking this medication. It will make you drowsy.  Non steroidal anti inflammatory medications including Ibuprofen and naproxen;  These medications help both pain and swelling and are very useful in treating back pain.  They should be taken with food, as they can cause stomach upset, and more seriously, stomach bleeding.    Muscle relaxants (flexeril):  These medications can help with muscle tightness that is a cause of lower back pain.  Most of these medications can cause drowsiness, and it is not safe to drive or use dangerous machinery while taking them.  SEEK IMMEDIATE MEDICAL ATTENTION IF: New numbness, tingling, weakness, or problem with the use of your arms or legs.  Severe back pain not relieved with medications.  Difficulty with or loss of control of your bowel or bladder control.  Increasing  pain in any areas of the body (such as chest or abdominal pain).  Shortness of breath, dizziness or fainting.  Nausea (feeling sick to your stomach), vomiting, fever, or sweats.  You will need to follow up with  Your primary healthcare provider in 1-2 weeks for reassessment.   Back Pain, Adult Low back pain is very common. About 1 in 5 people have back pain.The cause of low back pain is rarely dangerous. The pain often gets better over time.About half of people with a sudden onset of back pain feel better in just 2 weeks. About 8 in 10 people feel better by 6 weeks.  CAUSES Some common causes of back pain include:  Strain of the muscles or ligaments supporting the spine.  Wear and tear (degeneration) of the spinal discs.  Arthritis.  Direct injury to the back. DIAGNOSIS Most of the time, the direct cause of low back pain is not known.However, back pain can be treated effectively even when the exact cause of the pain is unknown.Answering your caregiver's questions about your overall health and symptoms is one of the most accurate ways to make sure the cause of your pain is not dangerous. If your caregiver needs more information, he or she may order lab work or imaging tests (X-rays or MRIs).However, even if imaging tests show changes in your back, this usually does not require surgery. HOME CARE INSTRUCTIONS For many people, back pain returns.Since low back pain is rarely dangerous, it is often  a condition that people can learn to [facility] their own.   Remain active. It is stressful on the back to sit or stand in one place. Do not sit, drive, or stand in one place for more than 30 minutes at a time. Take short walks on level surfaces as soon as pain allows.Try to increase the length of time you walk each day.  Do not stay in bed.Resting more than 1 or 2 days can delay your recovery.  Do not avoid exercise or work.Your body is made to move.It is not dangerous to be active, even  though your back may hurt.Your back will likely heal faster if you return to being active before your pain is gone.  Pay attention to your body when you bend and lift. Many people have less discomfortwhen lifting if they bend their knees, keep the load close to their bodies,and avoid twisting. Often, the most comfortable positions are those that put less stress on your recovering back.  Find a comfortable position to sleep. Use a firm mattress and lie on your side with your knees slightly bent. If you lie on your back, put a pillow under your knees.  Only take over-the-counter or prescription medicines as directed by your caregiver. Over-the-counter medicines to reduce pain and inflammation are often the most helpful.Your caregiver may prescribe muscle relaxant drugs.These medicines help dull your pain so you can more quickly return to your normal activities and healthy exercise.  Put ice on the injured area.  Put ice in a plastic bag.  Place a towel between your skin and the bag.  Leave the ice on for 15-20 minutes, 03-04 times a day for the first 2 to 3 days. After that, ice and heat may be alternated to reduce pain and spasms.  Ask your caregiver about trying back exercises and gentle massage. This may be of some benefit.  Avoid feeling anxious or stressed.Stress increases muscle tension and can worsen back pain.It is important to recognize when you are anxious or stressed and learn ways to manage it.Exercise is a great option. SEEK MEDICAL CARE IF:  You have pain that is not relieved with rest or medicine.  You have pain that does not improve in 1 week.  You have new symptoms.  You are generally not feeling well. SEEK IMMEDIATE MEDICAL CARE IF:   You have pain that radiates from your back into your legs.  You develop new bowel or bladder control problems.  You have unusual weakness or numbness in your arms or legs.  You develop nausea or vomiting.  You develop  abdominal pain.  You feel faint. Document Released: 07/06/2005 Document Revised: 01/05/2012 Document Reviewed: 11/07/2013 [facility] Patient Information 2015 [place], [place]. This information is not intended to replace advice given to you by your health care provider. Make sure you discuss any questions you have with your health care provider.  Heat Therapy Heat therapy can help make painful, stiff muscles and joints feel better. Do not use heat on new injuries. Wait at least 48 hours after an injury to use heat. Do not use heat when you have aches or pains right after an activity. If you still have pain 3 hours after stopping the activity, then you may use heat. HOME CARE Wet heat pack  Soak a clean towel in warm water. Squeeze out the extra water.  Put the warm, wet towel in a plastic bag.  Place a thin, dry towel between your skin and the bag.  Put the  heat pack on the area for 5 minutes, and check your skin. Your skin may be pink, but it should not be red.  Leave the heat pack on the area for 15 to 30 minutes.  Repeat this every 2 to 4 hours while awake. Do not use heat while you are sleeping. Warm water bath  Fill a tub with warm water.  Place the affected body part in the tub.  Soak the area for 20 to 40 minutes.  Repeat as needed. Hot water bottle  Fill the water bottle half full with hot water.  Press out the extra air. Close the cap tightly.  Place a dry towel between your skin and the bottle.  Put the bottle on the area for 5 minutes, and check your skin. Your skin may be pink, but it should not be red.  Leave the bottle on the area for 15 to 30 minutes.  Repeat this every 2 to 4 hours while awake. Electric heating pad  Place a dry towel between your skin and the heating pad.  Set the heating pad on low heat.  Put the heating pad on the area for 10 minutes, and check your skin. Your skin may be pink, but it should not be red.  Leave the heating pad on the  area for 20 to 40 minutes.  Repeat this every 2 to 4 hours while awake.  Do not lie on the heating pad.  Do not fall asleep while using the heating pad.  Do not use the heating pad near water. GET HELP RIGHT AWAY IF:  You get blisters or red skin.  Your skin is puffy (swollen), or you lose feeling (numbness) in the affected area.  You have any new problems.  Your problems are getting worse.  You have any questions or concerns. If you have any problems, stop using heat therapy until you see your doctor. MAKE SURE YOU:  Understand these instructions.  Will watch your condition.  Will get help right away if you are not doing well or get worse. Document Released: 09/28/2011 Document Reviewed: 08/29/2013 [facility] Patient Information 2015 [place]. This information is not intended to replace advice given to you by your health care provider. Make sure you discuss any questions you have with your health care provider.

## 2014-03-03 NOTE — ED Provider Notes (Signed)
CSN: [id number]     Arrival date & time 03/03/14  1126 History  This chart was scribed for non-physician practitioner Zacarias Pontes, PA-C working with Richarda Blade, MD by Ludger Nutting, ED Scribe. This patient was seen in room TR05C/TR05C and the patient's care was started at 12:22 PM.    Chief Complaint  Patient presents with  . Back Pain    Patient is a 53 y.o. female presenting with back pain. The history is provided by the patient. No language interpreter was used.  Back Pain Location:  Lumbar spine Quality:  Stiffness Radiates to:  L posterior upper leg, R posterior upper leg, L thigh and R thigh Pain severity:  Moderate Pain is:  Same all the time Duration:  1 day Timing:  Intermittent Chronicity:  Recurrent Context: not falling, not lifting heavy objects and not recent injury   Relieved by:  Being still Worsened by:  Bending, movement, standing, sitting and twisting Ineffective treatments:  Bowel activity and OTC medications Associated symptoms: no abdominal pain, no bladder incontinence, no bowel incontinence, no chest pain, no dysuria, no headaches, no paresthesias, no tingling and no weakness     HPI Comments: Kristin Bass is a 53 y.o. female who presents to the Emergency Department complaining of chronic, ongoing, intermittent episodes of lower back pain with occasional radiation to the bilateral posterior and lateral thighs and bilateral hips that acutely worsened yesterday. She reports having these episodes for the past several weeks but this most recent episode began this morning when she had difficulty getting out of bed. Patient states her current pain is mild but increases in severity with certain movements such as standing and sitting. She describes the pain as stiffness to both the lower back as well as the thighs. Patient states the pain is usually relieved with a BM but was unable to have one this morning, but continues to pass flatus and states she had several  BMs yesterday. She denies recent falls, injuries, or trauma to the affected area. She reports having an XRAY and MRI which showed degenerative disc disease of the lumbar spine and hip bursitis. She denies dysuria, groin pain, abdominal pain, urine changes, upper back pain/flank pain, lower extremity numbness or tingling, HA, blurry vision, chest pain, SOB, bowel/bladder incontinence, paresthesias, or weakness.   Past Medical History  Diagnosis Date  . Anxiety   . Wrist fracture   . Infertility, female   . Myofascial pain syndrome    Past Surgical History  Procedure Laterality Date  . Breast cyst excision Left   . Foot surgery Left 11/11    hammer toe & plantar fascitis  . Dilation and curettage of uterus      times 2  . Cystectomy      from neck  . Vein ligation and stripping     Family History  Problem Relation Age of Onset  . Cancer Mother     colon  . Hypertension Father   . Anxiety disorder Father   . Diabetes Maternal Grandfather    History  Substance Use Topics  . Smoking status: Former Research scientist (life sciences)  . Smokeless tobacco: Not on file  . Alcohol Use: No   OB History   Grav Para Term Preterm Abortions TAB SAB Ect Mult Living   2    2   2   0     Obstetric Comments   1 adopted children     Review of Systems  Eyes: Negative for visual disturbance.  Respiratory: Negative  for shortness of breath.   Cardiovascular: Negative for chest pain.  Gastrointestinal: Negative for nausea, vomiting, abdominal pain and bowel incontinence.  Genitourinary: Negative for bladder incontinence, dysuria and frequency.  Musculoskeletal: Positive for back pain.  Neurological: Negative for tingling, weakness, headaches and paresthesias.  10 Systems reviewed and are negative for acute change except as noted in the HPI.     Allergies  Ciprofloxacin; Macrobid; and Septra  Home Medications   Prior to Admission medications   Medication Sig Start Date End Date Taking? Authorizing Provider   acetaminophen (TYLENOL) 500 MG tablet Take 500 mg by mouth every 8 (eight) hours as needed for mild pain.    Historical Provider, MD  Calcium Carbonate-Vitamin D (CALCIUM + D PO) Take 1 tablet by mouth daily.    Historical Provider, MD  citalopram (CELEXA) 10 MG tablet Take 15 mg by mouth at bedtime.     Historical Provider, MD  cyclobenzaprine (FLEXERIL) 10 MG tablet Take 1 tablet (10 mg total) by mouth 3 (three) times daily as needed for muscle spasms. 03/03/14   Kammi Hechler Strupp Camprubi-Soms, PA-C  Docusate Calcium (STOOL SOFTENER PO) Take 1 capsule by mouth daily.     Historical Provider, MD  doxycycline (VIBRAMYCIN) 100 MG capsule Take 100 mg by mouth 2 (two) times daily.    Historical Provider, MD  Fiber TABS Take 1 tablet by mouth daily.     Historical Provider, MD  fluticasone (FLONASE) 50 MCG/ACT nasal spray Place 1 spray into both nostrils daily as needed for allergies or rhinitis.    Historical Provider, MD  Glucosamine HCl (GLUCOSAMINE PO) Take 1 tablet by mouth daily.     Historical Provider, MD  HYDROcodone-acetaminophen (NORCO) 5-325 MG per tablet Take 1 tablet by mouth every 6 (six) hours as needed for moderate pain. 12/14/13   Liam Graham, PA-C  HYDROcodone-acetaminophen (NORCO) 5-325 MG per tablet Take 1-2 tablets by mouth every 6 (six) hours as needed for severe pain. 03/03/14   Anastacia Reinecke Strupp Camprubi-Soms, PA-C  ibuprofen (ADVIL,MOTRIN) 800 MG tablet Take 1 tablet (800 mg total) by mouth 3 (three) times daily. 12/14/13   Freeman Caldron Baker, PA-C  montelukast (SINGULAIR) 10 MG tablet Take 10 mg by mouth at bedtime as needed (for allergies).    Historical Provider, MD  naproxen (NAPROSYN) 500 MG tablet Take 1 tablet (500 mg total) by mouth 2 (two) times daily as needed for mild pain, moderate pain or headache (TAKE WITH MEALS.). 03/03/14   Telly Jawad Strupp Camprubi-Soms, PA-C  Omega-3 Fatty Acids (FISH OIL PO) Take 1 capsule by mouth daily.     Historical Provider, MD  zolpidem  (AMBIEN) 5 MG tablet Take 2.5 mg by mouth at bedtime as needed for sleep.    Historical Provider, MD   BP 97/68  Pulse 75  Temp(Src) 98.3 F (36.8 C) (Oral)  Resp 12  SpO2 100%  LMP 01/18/2012 Physical Exam  Nursing note and vitals reviewed. Constitutional: She is oriented to person, place, and time. Vital signs are normal. She appears well-developed and well-nourished. No distress.  VSS, NAD, afebrile  HENT:  Head: Normocephalic and atraumatic.  Mouth/Throat: Mucous membranes are normal.  Eyes: Conjunctivae and EOM are normal. Pupils are equal, round, and reactive to light. Right eye exhibits no discharge. Left eye exhibits no discharge.  Neck: Normal range of motion. Neck supple. No spinous process tenderness and no muscular tenderness present. No rigidity. No tracheal deviation and normal range of motion present.  FROM intact without spinous  process or paraspinous muscle tenderness to palpation. No meningeal signs.  Cardiovascular: Normal rate, regular rhythm, normal heart sounds and intact distal pulses.  Exam reveals no gallop and no friction rub.   No murmur heard. Pulmonary/Chest: Effort normal and breath sounds normal. No respiratory distress. She has no decreased breath sounds. She has no wheezes. She has no rhonchi. She has no rales.  Abdominal: Soft. Normal appearance and bowel sounds are normal. She exhibits no distension. There is no tenderness. There is no rigidity, no rebound, no guarding and no CVA tenderness.  Soft, nontender, nondistended with no rebound, guarding, or rigidity. No CVA tenderness.  Musculoskeletal: Normal range of motion.       Right hip: She exhibits tenderness.       Left hip: She exhibits tenderness.       Legs: Cervical exam as noted above. All spinal levels nontender to palpation over her spinous processes and paraspinous muscles. No bony step-offs or deformities. Bilateral hips tender to palpation within joint line laterally with no greater  trochanteric bursa tenderness to palpation bilaterally. L hip appears slightly higher than R hip when standing. FROM intact in all spinal levels and hips, gait mildly antalgic. Neg SLR bilaterally. Sensation grossly intact in all extremities, strength 5/5 in all extremities.  Neurological: She is alert and oriented to person, place, and time. She has normal strength and normal reflexes. No sensory deficit. Gait abnormal.  Sensation grossly intact in all extremities, strength 5/5 in all extremities, DTRs equal in all extremities, gait mildly antalgic  Skin: Skin is warm and dry.  Psychiatric: She has a normal mood and affect. Her behavior is normal.    ED Course  Procedures (including critical care time)  DIAGNOSTIC STUDIES: Oxygen Saturation is 100% on RA, normal by my interpretation.    COORDINATION OF CARE: 12:34 PM Will order UA and give ibuprofen. Discussed treatment plan with pt at bedside and pt agreed to plan.   Labs Review Labs Reviewed  URINALYSIS, ROUTINE W REFLEX MICROSCOPIC    Imaging Review No results found. MRI lumbar spine 01/08/14: multilevel DDD    EKG Interpretation None      MDM   Final diagnoses:  Chronic back pain  Chronic hip pain, unspecified laterality    53y/o female with chronic back pain and hip pain, see by ortho already and evaluated, known DDD and hip bursitis. Hip xrays obtained from ortho not in system, but pt agrees to no imaging today. Will check U/A for UTI. Ibuprofen for pain given. Doubt that her clinical presentation is related to any obstruction of her GI tract. There are no red flag or cauda equina symptoms, do not believe are emergent MRI is necessary at this time. U/A neg, this pain is likely related to her chronic back pain as well as some chronic hip pain. Discussed that she should followup with the provider she is seen already for her back pain, states that she is seeing Dr. Christoper Fabian and [facility]. Discussed use of heat and  Naprosyn and Percocet for pain as well as flexeril for spasm. I explained the diagnosis and have given explicit precautions to return to the ER including for any other new or worsening symptoms. The patient understands and accepts the medical plan as it's been dictated and I have answered their questions. Discharge instructions concerning home care and prescriptions have been given. The patient is STABLE and is discharged to home in good condition.  I personally performed the services described in this  documentation, which was scribed in my presence. The recorded information has been reviewed and is accurate.  BP 97/68  Pulse 75  Temp(Src) 98.3 F (36.8 C) (Oral)  Resp 12  SpO2 100%  LMP 01/18/2012  Meds ordered this encounter  Medications  . ibuprofen (ADVIL,MOTRIN) tablet 600 mg    Sig:   . naproxen (NAPROSYN) 500 MG tablet    Sig: Take 1 tablet (500 mg total) by mouth 2 (two) times daily as needed for mild pain, moderate pain or headache (TAKE WITH MEALS.).    Dispense:  20 tablet    Refill:  0    Order Specific Question:  Supervising Provider    Answer:  Noemi Chapel D [[id number]]  . HYDROcodone-acetaminophen (NORCO) 5-325 MG per tablet    Sig: Take 1-2 tablets by mouth every 6 (six) hours as needed for severe pain.    Dispense:  10 tablet    Refill:  0    Order Specific Question:  Supervising Provider    Answer:  Noemi Chapel D [[id number]]  . cyclobenzaprine (FLEXERIL) 10 MG tablet    Sig: Take 1 tablet (10 mg total) by mouth 3 (three) times daily as needed for muscle spasms.    Dispense:  15 tablet    Refill:  0    Order Specific Question:  Supervising Provider    Answer:  Johnna Acosta [address] Charrise Lardner Camprubi-Soms, PA-C 03/03/14 1429

## 2014-03-03 NOTE — ED Notes (Signed)
Per pt sts she woke up this am with lower back pain radiating dow her legs. sts also pain in hips.

## 2014-03-03 NOTE — ED Notes (Signed)
Pt reports she had an MRI recently and was told she has DDD . Pt canceled an appt with an ortho MD because she felt better until this AM . When pt woke up this AM she had pain 10/10 and could not get out of bed. Pt also reports the pain goes down both legs when sitting.

## 2014-03-04 NOTE — ED Provider Notes (Signed)
Medical screening examination/treatment/procedure(s) were performed by non-physician practitioner and as supervising physician I was immediately available for consultation/collaboration.   EKG Interpretation None       Richarda Blade, MD 03/04/14 [phone]

## 2014-04-04 ENCOUNTER — Encounter: Payer: Self-pay | Admitting: Certified Nurse Midwife

## 2014-04-04 ENCOUNTER — Ambulatory Visit (INDEPENDENT_AMBULATORY_CARE_PROVIDER_SITE_OTHER): Payer: BC Managed Care – PPO | Admitting: Certified Nurse Midwife

## 2014-04-04 VITALS — BP 106/64 | HR 70 | Resp 16 | Ht 69.0 in | Wt 187.0 lb

## 2014-04-04 DIAGNOSIS — Z0000 Encounter for general adult medical examination without abnormal findings: Secondary | ICD-10-CM

## 2014-04-04 DIAGNOSIS — Z01419 Encounter for gynecological examination (general) (routine) without abnormal findings: Secondary | ICD-10-CM

## 2014-04-04 DIAGNOSIS — Z124 Encounter for screening for malignant neoplasm of cervix: Secondary | ICD-10-CM

## 2014-04-04 LAB — POCT URINALYSIS DIPSTICK
Bilirubin, UA: NEGATIVE
Blood, UA: NEGATIVE
Glucose, UA: NEGATIVE
Ketones, UA: NEGATIVE
Leukocytes, UA: NEGATIVE
Nitrite, UA: NEGATIVE
Protein, UA: NEGATIVE
Urobilinogen, UA: NEGATIVE
pH, UA: 5

## 2014-04-04 LAB — HEMOGLOBIN, FINGERSTICK: Hemoglobin, fingerstick: 12.3 g/dL (ref 12.0–16.0)

## 2014-04-04 NOTE — Progress Notes (Signed)
53 y.o. G32P0020 Married Caucasian Fe here for annual exam.  Menopausal no HRT. Denies vaginal bleeding or dryness. Patient has not been sexually active for one year, mutual choice due to past issues. Busy with work and family. Sees PCP prn. Being treated for shoulder issues now due to fall. Desires screening labs. Patient having leg cramps at hs. Taking 1200 mg calcium at hs and the cramping occurs. No throbbing or leg discoloration. No other health issues today.  Patient's last menstrual period was 01/18/2012.          Sexually active: No.  The current method of family planning is vasectomy.    Exercising: No.  exercise but some physical therapy  Smoker:  no  Health Maintenance: Pap: 10-20-11 neg HPV HR neg MMG:  01-17-14 composition category a, birads category 1:neg Colonoscopy:  2014 negative BMD: ? Unsure of when TDaP:  2014 Labs: Poct urine-neg, Hgb-12.3 Self breast exam: not done   reports that she has quit smoking. She does not have any smokeless tobacco history on file. She reports that she does not drink alcohol or use illicit drugs.  Past Medical History  Diagnosis Date  . Anxiety   . Wrist fracture   . Infertility, female   . Myofascial pain syndrome   . Mild scoliosis   . History of degenerative disc disease     mild    Past Surgical History  Procedure Laterality Date  . Breast cyst excision Left   . Foot surgery Left 11/11    hammer toe & plantar fascitis  . Dilation and curettage of uterus      times 2  . Cystectomy      from neck  . Vein ligation and stripping      Current Outpatient Prescriptions  Medication Sig Dispense Refill  . acetaminophen (TYLENOL) 500 MG tablet Take 500 mg by mouth every 8 (eight) hours as needed for mild pain.      . Calcium Carbonate-Vitamin D (CALCIUM + D PO) Take 1 tablet by mouth daily.      . citalopram (CELEXA) 20 MG tablet Take 20 mg by mouth daily.      Mariane Baumgarten Calcium (STOOL SOFTENER PO) Take 1 capsule by mouth daily.        . Fiber TABS Take 1 tablet by mouth daily.       . fluticasone (FLONASE) 50 MCG/ACT nasal spray Place 1 spray into both nostrils daily as needed for allergies or rhinitis.      . Glucosamine HCl (GLUCOSAMINE PO) Take 1 tablet by mouth daily.       . Omega-3 Fatty Acids (FISH OIL PO) Take 1 capsule by mouth daily.        No current facility-administered medications for this visit.    Family History  Problem Relation Age of Onset  . Cancer Mother     colon  . Hypertension Father   . Anxiety disorder Father   . Diabetes Maternal Grandfather     ROS:  Pertinent items are noted in HPI.  Otherwise, a comprehensive ROS was negative.  Exam:   BP 106/64  Pulse 70  Resp 16  Ht 5\' 9"  (1.753 m)  Wt 187 lb (84.823 kg)  BMI 27.60 kg/m2  LMP 01/18/2012 Height: 5\' 9"  (175.3 cm) (with shoes on)  Ht Readings from Last 3 Encounters:  04/04/14 5\' 9"  (1.753 m)  01/09/13 5' 8.5" (1.74 m)    General appearance: alert, cooperative and appears stated age Head:  Normocephalic, without obvious abnormality, atraumatic Neck: no adenopathy, supple, symmetrical, trachea midline and thyroid normal to inspection and palpation Lungs: clear to auscultation bilaterally Breasts: normal appearance, no masses or tenderness, No nipple retraction or dimpling, No nipple discharge or bleeding, No axillary or supraclavicular adenopathy Heart: regular rate and rhythm Abdomen: soft, non-tender; no masses,  no organomegaly Extremities: extremities normal, atraumatic, no cyanosis or edema, pulses equal Skin: Skin color, texture, turgor normal. No rashes or lesions Lymph nodes: Cervical, supraclavicular, and axillary nodes normal. No abnormal inguinal nodes palpated Neurologic: Grossly normal   Pelvic: External genitalia:  no lesions              Urethra:  normal appearing urethra with no masses, tenderness or lesions              Bartholin's and Skene's: normal                 Vagina: normal appearing vagina with  normal color and discharge, no lesions              Cervix: normal, non tender, no lesions, very posterior              Pap taken: Yes.   Bimanual Exam:  Uterus:  normal size, contour, position, consistency, mobility, non-tender and anteflexed              Adnexa: normal adnexa and no mass, fullness, tenderness               Rectovaginal: Confirms               Anus:  normal sphincter tone, no lesions  A:  Well Woman with normal exam  Menopausal no HRT  Leg cramps  Right Shoulder injury under follow up  Screening labs  P:   Reviewed health and wellness pertinent to exam  Aware of need to evaluate if vaginal bleeding  Discussed absorption of calcium only 600 mg at a time. Stop 1200 mg at hs, only 600 mg and good magnesium balance with. Work on dietary calcium during day. Walk prior to bedtime to increase circulation. DVT warnings given.  Continue follow up as indicated  Lab: Lipid panel, CBC, CMP, TSH,VitaminD, Potassium  Pap smear taken today with HPV reflex   counseled on breast self exam, mammography screening, menopause, adequate intake of calcium and vitamin D, diet and exercise  return annually or prn  An After Visit Summary was printed and given to the patient.

## 2014-04-04 NOTE — Patient Instructions (Signed)

## 2014-04-05 LAB — POTASSIUM: Potassium: 4 mEq/L (ref 3.5–5.3)

## 2014-04-05 LAB — COMPREHENSIVE METABOLIC PANEL
ALT: 28 U/L (ref 0–35)
AST: 26 U/L (ref 0–37)
Albumin: 4.1 g/dL (ref 3.5–5.2)
Alkaline Phosphatase: 54 U/L (ref 39–117)
BUN: 17 mg/dL (ref 6–23)
CO2: 30 mEq/L (ref 19–32)
Calcium: 9 mg/dL (ref 8.4–10.5)
Chloride: 105 mEq/L (ref 96–112)
Creat: 0.7 mg/dL (ref 0.50–1.10)
Glucose, Bld: 66 mg/dL — ABNORMAL LOW (ref 70–99)
Potassium: 4 mEq/L (ref 3.5–5.3)
Sodium: 140 mEq/L (ref 135–145)
Total Bilirubin: 0.3 mg/dL (ref 0.2–1.2)
Total Protein: 6.1 g/dL (ref 6.0–8.3)

## 2014-04-05 LAB — CBC
HCT: 38.1 % (ref 36.0–46.0)
Hemoglobin: 12.4 g/dL (ref 12.0–15.0)
MCH: 30.5 pg (ref 26.0–34.0)
MCHC: 32.5 g/dL (ref 30.0–36.0)
MCV: 93.6 fL (ref 78.0–100.0)
Platelets: 222 10*3/uL (ref 150–400)
RBC: 4.07 MIL/uL (ref 3.87–5.11)
RDW: 13.4 % (ref 11.5–15.5)
WBC: 5 10*3/uL (ref 4.0–10.5)

## 2014-04-05 LAB — LIPID PANEL
Cholesterol: 177 mg/dL (ref 0–200)
HDL: 71 mg/dL (ref >39)
LDL Cholesterol: 90 mg/dL (ref 0–99)
Total CHOL/HDL Ratio: 2.5 Ratio
Triglycerides: 82 mg/dL (ref <150)
VLDL: 16 mg/dL (ref 0–40)

## 2014-04-06 LAB — VITAMIN D 25 HYDROXY (VIT D DEFICIENCY, FRACTURES): Vit D, 25-Hydroxy: 46 ng/mL (ref 30–89)

## 2014-04-06 LAB — TSH: TSH: 1.05 u[IU]/mL (ref 0.350–4.500)

## 2014-04-06 LAB — IPS PAP TEST WITH REFLEX TO HPV

## 2014-04-07 ENCOUNTER — Encounter: Payer: Self-pay | Admitting: Certified Nurse Midwife

## 2014-04-09 NOTE — Progress Notes (Signed)
Reviewed personally.  M. Suzanne Solange Emry, MD.  

## 2014-04-27 ENCOUNTER — Emergency Department (HOSPITAL_COMMUNITY): Payer: BC Managed Care – PPO

## 2014-04-27 ENCOUNTER — Encounter (HOSPITAL_COMMUNITY): Payer: Self-pay | Admitting: Emergency Medicine

## 2014-04-27 ENCOUNTER — Emergency Department (HOSPITAL_COMMUNITY)
Admission: EM | Admit: 2014-04-27 | Discharge: 2014-04-27 | Disposition: A | Discharge status: Home / Self Care | Payer: BC Managed Care – PPO | Attending: Emergency Medicine | Admitting: Emergency Medicine

## 2014-04-27 DIAGNOSIS — S99911A Unspecified injury of right ankle, initial encounter: Secondary | ICD-10-CM | POA: Diagnosis present

## 2014-04-27 DIAGNOSIS — S93401A Sprain of unspecified ligament of right ankle, initial encounter: Secondary | ICD-10-CM | POA: Insufficient documentation

## 2014-04-27 DIAGNOSIS — Z87438 Personal history of other diseases of male genital organs: Secondary | ICD-10-CM | POA: Diagnosis not present

## 2014-04-27 DIAGNOSIS — N979 Female infertility, unspecified: Secondary | ICD-10-CM | POA: Insufficient documentation

## 2014-04-27 DIAGNOSIS — Z8739 Personal history of other diseases of the musculoskeletal system and connective tissue: Secondary | ICD-10-CM | POA: Diagnosis not present

## 2014-04-27 DIAGNOSIS — F419 Anxiety disorder, unspecified: Secondary | ICD-10-CM | POA: Insufficient documentation

## 2014-04-27 DIAGNOSIS — Y9289 Other specified places as the place of occurrence of the external cause: Secondary | ICD-10-CM | POA: Diagnosis not present

## 2014-04-27 DIAGNOSIS — S92351A Displaced fracture of fifth metatarsal bone, right foot, initial encounter for closed fracture: Secondary | ICD-10-CM

## 2014-04-27 DIAGNOSIS — Z79899 Other long term (current) drug therapy: Secondary | ICD-10-CM | POA: Insufficient documentation

## 2014-04-27 DIAGNOSIS — S92354A Nondisplaced fracture of fifth metatarsal bone, right foot, initial encounter for closed fracture: Secondary | ICD-10-CM | POA: Diagnosis not present

## 2014-04-27 DIAGNOSIS — W1849XA Other slipping, tripping and stumbling without falling, initial encounter: Secondary | ICD-10-CM | POA: Insufficient documentation

## 2014-04-27 DIAGNOSIS — Y9301 Activity, walking, marching and hiking: Secondary | ICD-10-CM | POA: Insufficient documentation

## 2014-04-27 DIAGNOSIS — Y990 Civilian activity done for income or pay: Secondary | ICD-10-CM | POA: Diagnosis not present

## 2014-04-27 DIAGNOSIS — Z87891 Personal history of nicotine dependence: Secondary | ICD-10-CM | POA: Diagnosis not present

## 2014-04-27 MED ORDER — IBUPROFEN 800 MG PO TABS: 800.0000 mg | ORAL_TABLET | Freq: Three times a day (TID) | ORAL | Status: DC

## 2014-04-27 MED ORDER — TRAMADOL HCL 50 MG PO TABS: 50.0000 mg | ORAL_TABLET | Freq: Four times a day (QID) | ORAL | Status: DC | PRN

## 2014-04-27 MED ORDER — ACETAMINOPHEN 500 MG PO TABS: 500.0000 mg | ORAL_TABLET | Freq: Four times a day (QID) | ORAL | Status: DC | PRN

## 2014-04-27 NOTE — ED Provider Notes (Signed)
CSN: [id number]     Arrival date & time 04/27/14  1726 History  This chart was scribed for Baron Sane, PA-C, working with Orlie Dakin, MD by Girtha Hake, ED Scribe. The patient was seen in TR11C/TR11C. The patient's care was started at 5:50 PM.    Chief Complaint  Patient presents with  . Fall  . Ankle Pain   HPI HPI Comments: Kristin Bass is a 53 y.o. female who presents to the Emergency Department complaining of fall that occurred earlier today when she tripped while walking down the stairs at work. She reports right foot and right ankle pain with associated swelling. Patient states she had severe throbbing pain, but was alleviated with Motrin. No pain currently. She denies a history of pain or broken bones in her right foot and ankle. Patient denies head trauma, LOC, or numbness or tingling in foot.   PCP is Dr. Christoper Fabian.  Orthopedist is Dr. Tonita Cong  Past Medical History  Diagnosis Date  . Anxiety   . Wrist fracture   . Infertility, female   . Myofascial pain syndrome   . Mild scoliosis   . History of degenerative disc disease     mild   Past Surgical History  Procedure Laterality Date  . Breast cyst excision Left   . Foot surgery Left 11/11    hammer toe & plantar fascitis  . Dilation and curettage of uterus      times 2  . Cystectomy      from neck  . Vein ligation and stripping     Family History  Problem Relation Age of Onset  . Cancer Mother     colon  . Hypertension Father   . Anxiety disorder Father   . Diabetes Maternal Grandfather    History  Substance Use Topics  . Smoking status: Former Research scientist (life sciences)  . Smokeless tobacco: Not on file  . Alcohol Use: No   OB History   Grav Para Term Preterm Abortions TAB SAB Ect Mult Living   2    2   2   0     Obstetric Comments   1 adopted children     Review of Systems  Musculoskeletal: Positive for arthralgias (right food and ankle).  Neurological: Negative for syncope and numbness.  All other  systems reviewed and are negative.     Allergies  Ciprofloxacin; Macrobid; and Septra  Home Medications   Prior to Admission medications   Medication Sig Start Date End Date Taking? Authorizing Provider  acetaminophen (TYLENOL) 500 MG tablet Take 500 mg by mouth every 8 (eight) hours as needed for mild pain.   Yes Historical Provider, MD  Calcium Carbonate-Vitamin D (CALCIUM + D PO) Take 1 tablet by mouth daily.   Yes Historical Provider, MD  citalopram (CELEXA) 20 MG tablet Take 20 mg by mouth daily.   Yes Historical Provider, MD  Docusate Calcium (STOOL SOFTENER PO) Apply 1 capsule topically at bedtime. Take every night per patient   Yes Historical Provider, MD  Fiber TABS Take 1 tablet by mouth daily.    Yes Historical Provider, MD  fluticasone (FLONASE) 50 MCG/ACT nasal spray Place 1 spray into both nostrils daily as needed for allergies or rhinitis.   Yes Historical Provider, MD  Glucosamine HCl (GLUCOSAMINE PO) Take 2 tablets by mouth daily.    Yes Historical Provider, MD  ibuprofen (ADVIL,MOTRIN) 800 MG tablet Take 800 mg by mouth every 8 (eight) hours as needed.   Yes Historical Provider, MD  Omega-3 Fatty Acids (FISH OIL PO) Take 1 capsule by mouth every evening.    Yes Historical Provider, MD  acetaminophen (TYLENOL) 500 MG tablet Take 1 tablet (500 mg total) by mouth every 6 (six) hours as needed. 04/27/14   Vestal Markin L Cosimo Schertzer, PA-C  ibuprofen (ADVIL,MOTRIN) 800 MG tablet Take 1 tablet (800 mg total) by mouth 3 (three) times daily. 04/27/14   Nevin Kozuch L Kayler Rise, PA-C  traMADol (ULTRAM) 50 MG tablet Take 1 tablet (50 mg total) by mouth every 6 (six) hours as needed. 04/27/14   Stephani Police Aryannah Mohon, PA-C   Triage Vitals: BP 99/58  Pulse 76  Temp(Src) 98.7 F (37.1 C) (Oral)  Resp 18  Ht 5\' 8"  (1.727 m)  Wt 188 lb (85.276 kg)  BMI 28.59 kg/m2  SpO2 96%  LMP 01/18/2012 Physical Exam  Nursing note and vitals reviewed. Constitutional: She is oriented to person, place,  and time. She appears well-developed and well-nourished. No distress.  HENT:  Head: Normocephalic and atraumatic.  Right Ear: External ear normal.  Left Ear: External ear normal.  Nose: Nose normal.  Mouth/Throat: Oropharynx is clear and moist.  Eyes: Conjunctivae are normal.  Neck: Normal range of motion. Neck supple.  Cardiovascular: Normal rate, regular rhythm, normal heart sounds and intact distal pulses.   Pulmonary/Chest: Effort normal and breath sounds normal.  Abdominal: Soft.  Musculoskeletal:       Right ankle: She exhibits decreased range of motion and swelling. She exhibits no ecchymosis, no deformity and no laceration. Tenderness. Lateral malleolus tenderness found. No head of 5th metatarsal and no proximal fibula tenderness found.       Left ankle: Normal.       Right foot: She exhibits tenderness and swelling. She exhibits normal capillary refill, no crepitus, no deformity and no laceration.       Left foot: Normal.       Feet:  Neurological: She is alert and oriented to person, place, and time.  Skin: Skin is warm and dry. She is not diaphoretic.  Psychiatric: She has a normal mood and affect.    ED Course  Procedures (including critical care time) Medications - No data to display   COORDINATION OF CARE: 5:53 PM-Discussed treatment plan which includes right foot and ankle x-rays with pt at bedside and pt agreed to plan.     Labs Review Labs Reviewed - No data to display  Imaging Review Dg Ankle Complete Right  04/27/2014   CLINICAL DATA:  Tripped at work today. Tripped on stairs. Acute injury. Initial evaluation.  EXAM: RIGHT ANKLE - COMPLETE 3+ VIEW  COMPARISON:  None.  FINDINGS: Nondisplaced fracture of the base of the fifth metatarsal is present. No other abnormality identified.  IMPRESSION: Nondisplaced fracture at the base of right fifth metatarsal.   Electronically Signed   By: Marcello Moores  Register   On: 04/27/2014 18:33   Dg Foot Complete Right  04/27/2014    CLINICAL DATA:  Patient fell walking down stairs at work. Pain laterally  EXAM: RIGHT FOOT COMPLETE - 3+ VIEW  COMPARISON:  None.  FINDINGS: Frontal, oblique, and lateral views were obtained. There is a transversely oriented fracture of the proximal fifth metatarsal in essentially anatomic alignment. No other fracture. No dislocation. Joint spaces appear intact. No erosive change. There is a small inferior calcaneal spur.  IMPRESSION: Nondisplaced transversely oriented fracture proximal fifth metatarsal. No other fracture. No dislocation. Small calcaneal spur inferiorly.   Electronically Signed   By: Lowella Grip M.D.  On: 04/27/2014 18:34     EKG Interpretation None      Patient declines any medications for pain.   MDM   Final diagnoses:  Fracture of fifth metatarsal bone of right foot, closed, initial encounter  Right ankle sprain, initial encounter    Filed Vitals:   04/27/14 1736  BP: 99/58  Pulse: 76  Temp: 98.7 F (37.1 C)  Resp: 18   Afebrile, NAD, non-toxic appearing, AAOx4.  Neurovascularly intact. Normal sensation. No evidence of compartment syndrome. X-rays reviewed. No evidence of ankle fracture. Nondisplaced fifth metatarsal fracture noted. Please patient in a Cam Walker. He provided crutches. Advised to be nonweightbearing and to follow up with her orthopedist. Symptomatic measures discussed. Return precautions discussed. Patient is agreeable to plan. Patient is stable at time of discharge.    I personally performed the services described in this documentation, which was scribed in my presence. The recorded information has been reviewed and is accurate.     [place], PA-C 04/27/14 2000

## 2014-04-27 NOTE — ED Notes (Signed)
Ortho paged. 

## 2014-04-27 NOTE — ED Notes (Signed)
Reports falling at work and twisting right ankle. Moderate swelling noted.

## 2014-04-27 NOTE — Discharge Instructions (Signed)
Please follow up with your primary care physician in 1-2 days. If you do not have one please call the [facility] number listed above. Please alternate between Motrin and Tylenol every three hours for pain. Please follow RICE method below. Please read all discharge instructions and return precautions.    Ankle Sprain An ankle sprain is an injury to the strong, fibrous tissues (ligaments) that hold the bones of your ankle joint together.  CAUSES An ankle sprain is usually caused by a fall or by twisting your ankle. Ankle sprains most commonly occur when you step on the outer edge of your foot, and your ankle turns inward. People who participate in sports are more prone to these types of injuries.  SYMPTOMS   Pain in your ankle. The pain may be present at rest or only when you are trying to stand or walk.  Swelling.  Bruising. Bruising may develop immediately or within 1 to 2 days after your injury.  Difficulty standing or walking, particularly when turning corners or changing directions. DIAGNOSIS  Your caregiver will ask you details about your injury and perform a physical exam of your ankle to determine if you have an ankle sprain. During the physical exam, your caregiver will press on and apply pressure to specific areas of your foot and ankle. Your caregiver will try to move your ankle in certain ways. An X-ray exam may be done to be sure a bone was not broken or a ligament did not separate from one of the bones in your ankle (avulsion fracture).  TREATMENT  Certain types of braces can help stabilize your ankle. Your caregiver can make a recommendation for this. Your caregiver may recommend the use of medicine for pain. If your sprain is severe, your caregiver may refer you to a surgeon who helps to restore function to parts of your skeletal system (orthopedist) or a physical therapist. [place] ice to your injury for 1-2 days or as directed by your  caregiver. Applying ice helps to reduce inflammation and pain.  Put ice in a plastic bag.  Place a towel between your skin and the bag.  Leave the ice on for 15-20 minutes at a time, every 2 hours while you are awake.  Only take over-the-counter or prescription medicines for pain, discomfort, or fever as directed by your caregiver.  Elevate your injured ankle above the level of your heart as much as possible for 2-3 days.  If your caregiver recommends crutches, use them as instructed. Gradually put weight on the affected ankle. Continue to use crutches or a cane until you can walk without feeling pain in your ankle.  If you have a plaster splint, wear the splint as directed by your caregiver. Do not rest it on anything harder than a pillow for the first 24 hours. Do not put weight on it. Do not get it wet. You may take it off to take a shower or bath.  You may have been given an elastic bandage to wear around your ankle to provide support. If the elastic bandage is too tight (you have numbness or tingling in your foot or your foot becomes cold and blue), adjust the bandage to make it comfortable.  If you have an air splint, you may blow more air into it or let air out to make it more comfortable. You may take your splint off at night and before taking a shower or bath. Wiggle your toes in  the splint several times per day to decrease swelling. SEEK MEDICAL CARE IF:   You have rapidly increasing bruising or swelling.  Your toes feel extremely cold or you lose feeling in your foot.  Your pain is not relieved with medicine. SEEK IMMEDIATE MEDICAL CARE IF:  Your toes are numb or blue.  You have severe pain that is increasing. MAKE SURE YOU:   Understand these instructions.  Will watch your condition.  Will get help right away if you are not doing well or get worse. Document Released: 07/06/2005 Document Revised: 03/30/2012 Document Reviewed: 07/18/2011 [facility] Patient Information  2015 [place], [place]. This information is not intended to replace advice given to you by your health care provider. Make sure you discuss any questions you have with your health care provider. Metatarsal Fracture, Undisplaced A metatarsal fracture is a break in the bone(s) of the foot. These are the bones of the foot that connect your toes to the bones of the ankle. DIAGNOSIS  The diagnoses of these fractures are usually made with X-rays. If there are problems in the forefoot and x-rays are normal a later bone scan will usually make the diagnosis.  TREATMENT AND HOME CARE INSTRUCTIONS  Treatment may or may not include a cast or walking shoe. When casts are needed the use is usually for short periods of time so as not to slow down healing with muscle wasting (atrophy).  Activities should be stopped until further advised by your caregiver.  Wear shoes with adequate shock absorbing capabilities and stiff soles.  Alternative exercise may be undertaken while waiting for healing. These may include bicycling and swimming, or as your caregiver suggests.  It is important to keep all follow-up visits or specialty referrals. The failure to keep these appointments could result in improper bone healing and chronic pain or disability.  Warning: Do not drive a car or operate a motor vehicle until your caregiver specifically tells you it is safe to do so. IF YOU DO NOT HAVE A CAST OR SPLINT:  You may walk on your injured foot as tolerated or advised.  Do not put any weight on your injured foot for as long as directed by your caregiver. Slowly increase the amount of time you walk on the foot as the pain allows or as advised.  Use crutches until you can bear weight without pain. A gradual increase in weight bearing may help.  Apply ice to the injury for 15-20 minutes each hour while awake for the first 2 days. Put the ice in a plastic bag and place a towel between the bag of ice and your skin.  Only take  over-the-counter or prescription medicines for pain, discomfort, or fever as directed by your caregiver. SEEK IMMEDIATE MEDICAL CARE IF:   Your cast gets damaged or breaks.  You have continued severe pain or more swelling than you did before the cast was put on, or the pain is not controlled with medications.  Your skin or nails below the injury turn blue or grey, or feel cold or numb.  There is a bad smell, or new stains or pus-like (purulent) drainage coming from the cast. MAKE SURE YOU:   Understand these instructions.  Will watch your condition.  Will get help right away if you are not doing well or get worse. Document Released: 03/28/2002 Document Revised: 09/28/2011 Document Reviewed: 02/17/2008 [facility] Patient Information 2015 [place], [place]. This information is not intended to replace advice given to you by your health care provider.  Make sure you discuss any questions you have with your health care provider.  RICE: Routine Care for Injuries The routine care of many injuries includes Rest, Ice, Compression, and Elevation (RICE). HOME CARE INSTRUCTIONS  Rest is needed to allow your body to heal. Routine activities can usually be resumed when comfortable. Injured tendons and bones can take up to 6 weeks to heal. Tendons are the cord-like structures that attach muscle to bone.  Ice following an injury helps keep the swelling down and reduces pain.  Put ice in a plastic bag.  Place a towel between your skin and the bag.  Leave the ice on for 15-20 minutes, 3-4 times a day, or as directed by your health care provider. Do this while awake, for the first 24 to 48 hours. After that, continue as directed by your caregiver.  Compression helps keep swelling down. It also gives support and helps with discomfort. If an elastic bandage has been applied, it should be removed and reapplied every 3 to 4 hours. It should not be applied tightly, but firmly enough to keep swelling down. Watch  fingers or toes for swelling, bluish discoloration, coldness, numbness, or excessive pain. If any of these problems occur, remove the bandage and reapply loosely. Contact your caregiver if these problems continue.  Elevation helps reduce swelling and decreases pain. With extremities, such as the arms, hands, legs, and feet, the injured area should be placed near or above the level of the heart, if possible. SEEK IMMEDIATE MEDICAL CARE IF:  You have persistent pain and swelling.  You develop redness, numbness, or unexpected weakness.  Your symptoms are getting worse rather than improving after several days. These symptoms may indicate that further evaluation or further X-rays are needed. Sometimes, X-rays may not show a small broken bone (fracture) until 1 week or 10 days later. Make a follow-up appointment with your caregiver. Ask when your X-ray results will be ready. Make sure you get your X-ray results. Document Released: 10/18/2000 Document Revised: 07/11/2013 Document Reviewed: 12/05/2010 [facility] Patient Information 2015 [place], [place]. This information is not intended to replace advice given to you by your health care provider. Make sure you discuss any questions you have with your health care provider.

## 2014-04-27 NOTE — Progress Notes (Signed)
Orthopedic Tech Progress Note Patient Details:  Kristin Bass 1961-04-18 [id number]  Ortho Devices Type of Ortho Device: CAM walker;Crutches Ortho Device/Splint Location: RLE Ortho Device/Splint Interventions: Ordered;Application;Adjustment   Braulio Bosch 04/27/2014, 7:22 PM

## 2014-04-28 NOTE — ED Provider Notes (Signed)
Medical screening examination/treatment/procedure(s) were performed by non-physician practitioner and as supervising physician I was immediately available for consultation/collaboration.   EKG Interpretation None       Orlie Dakin, MD 04/28/14 0006

## 2014-04-29 ENCOUNTER — Emergency Department (INDEPENDENT_AMBULATORY_CARE_PROVIDER_SITE_OTHER)
Admission: EM | Admit: 2014-04-29 | Discharge: 2014-04-29 | Disposition: A | Discharge status: Home / Self Care | Payer: Worker's Compensation | Source: Home / Self Care | Attending: Family Medicine | Admitting: Family Medicine

## 2014-04-29 ENCOUNTER — Encounter (HOSPITAL_COMMUNITY): Payer: Self-pay | Admitting: Emergency Medicine

## 2014-04-29 DIAGNOSIS — S92351A Displaced fracture of fifth metatarsal bone, right foot, initial encounter for closed fracture: Secondary | ICD-10-CM

## 2014-04-29 MED ORDER — HYDROCODONE-ACETAMINOPHEN 5-325 MG PO TABS
1.0000 | ORAL_TABLET | Freq: Four times a day (QID) | ORAL | Status: DC | PRN
Start: 2014-04-29 — End: 2015-01-03

## 2014-04-29 NOTE — ED Provider Notes (Signed)
Kristin Bass is a 53 y.o. female who presents to Urgent Care today for right fifth metatarsal fracture. The patient fell while at work on Friday resulted in a proximal fifth metatarsal fracture. She was seen initially in the emergency room diagnosis was made. She was placed into a Cam Walker boot with crutches tramadol ibuprofen and Tylenol. She's here today because the tramadol is not sufficient to control her pain at bedtime. She took some leftover Norco which seems to help make sure bit dizzy. She notes that she cannot tolerate oxycodone based on her previous experience. She is working a scheduled appointment with orthopedic surgeon for Monday.   Past Medical History  Diagnosis Date  . Anxiety   . Wrist fracture   . Infertility, female   . Myofascial pain syndrome   . Mild scoliosis   . History of degenerative disc disease     mild   History  Substance Use Topics  . Smoking status: Former Research scientist (life sciences)  . Smokeless tobacco: Not on file  . Alcohol Use: No   ROS as above Medications: No current facility-administered medications for this encounter.   Current Outpatient Prescriptions  Medication Sig Dispense Refill  . acetaminophen (TYLENOL) 500 MG tablet Take 500 mg by mouth every 8 (eight) hours as needed for mild pain.      . Calcium Carbonate-Vitamin D (CALCIUM + D PO) Take 1 tablet by mouth daily.      . citalopram (CELEXA) 20 MG tablet Take 20 mg by mouth daily.      Mariane Baumgarten Calcium (STOOL SOFTENER PO) Apply 1 capsule topically at bedtime. Take every night per patient      . Fiber TABS Take 1 tablet by mouth daily.       . fluticasone (FLONASE) 50 MCG/ACT nasal spray Place 1 spray into both nostrils daily as needed for allergies or rhinitis.      . Glucosamine HCl (GLUCOSAMINE PO) Take 2 tablets by mouth daily.       Marland Kitchen ibuprofen (ADVIL,MOTRIN) 800 MG tablet Take 800 mg by mouth every 8 (eight) hours as needed.      Marland Kitchen ibuprofen (ADVIL,MOTRIN) 800 MG tablet Take 1 tablet (800 mg  total) by mouth 3 (three) times daily.  21 tablet  0  . Omega-3 Fatty Acids (FISH OIL PO) Take 1 capsule by mouth every evening.       . traMADol (ULTRAM) 50 MG tablet Take 1 tablet (50 mg total) by mouth every 6 (six) hours as needed.  15 tablet  0  . acetaminophen (TYLENOL) 500 MG tablet Take 1 tablet (500 mg total) by mouth every 6 (six) hours as needed.  30 tablet  0  . HYDROcodone-acetaminophen (NORCO/VICODIN) 5-325 MG per tablet Take 1 tablet by mouth every 6 (six) hours as needed.  15 tablet  0    Exam:  BP 100/66  Pulse 72  Temp(Src) 98.5 F (36.9 C) (Oral)  Resp 16  SpO2 96%  LMP 01/18/2012 Gen: Well NAD Right foot: Normal-appearing no significant ecchymosis. Mildly swollen proximal fifth metatarsal. Tender palpation proximal fifth metatarsal. Pulses capillary refill sensation intact.  No results found for this or any previous visit (from the past 24 hour(s)). Dg Ankle Complete Right  04/27/2014   CLINICAL DATA:  Tripped at work today. Tripped on stairs. Acute injury. Initial evaluation.  EXAM: RIGHT ANKLE - COMPLETE 3+ VIEW  COMPARISON:  None.  FINDINGS: Nondisplaced fracture of the base of the fifth metatarsal is present. No other abnormality  identified.  IMPRESSION: Nondisplaced fracture at the base of right fifth metatarsal.   Electronically Signed   By: Marcello Moores  Register   On: 04/27/2014 18:33   Dg Foot Complete Right  04/27/2014   CLINICAL DATA:  Patient fell walking down stairs at work. Pain laterally  EXAM: RIGHT FOOT COMPLETE - 3+ VIEW  COMPARISON:  None.  FINDINGS: Frontal, oblique, and lateral views were obtained. There is a transversely oriented fracture of the proximal fifth metatarsal in essentially anatomic alignment. No other fracture. No dislocation. Joint spaces appear intact. No erosive change. There is a small inferior calcaneal spur.  IMPRESSION: Nondisplaced transversely oriented fracture proximal fifth metatarsal. No other fracture. No dislocation. Small  calcaneal spur inferiorly.   Electronically Signed   By: Lowella Grip M.D.   On: 04/27/2014 18:34    Assessment and Plan: 53 y.o. female with nondisplaced dancers fracture of the right foot. Continue Cam Walker ibuprofen. Norco as needed at bedtime. Followup with orthopedics.  Discussed warning signs or symptoms. Please see discharge instructions. Patient expresses understanding.     Gregor Hams, MD 04/29/14 1536

## 2014-04-29 NOTE — Discharge Instructions (Signed)
Thank you for coming in today. Keep the foot elevated and use ice for 10 minutes at a time. Use the Cam Walker boot.  Use touchdown weightbearing. Followup with orthopedics.  Metatarsal Fracture, Undisplaced A metatarsal fracture is a break in the bone(s) of the foot. These are the bones of the foot that connect your toes to the bones of the ankle. DIAGNOSIS  The diagnoses of these fractures are usually made with X-rays. If there are problems in the forefoot and x-rays are normal a later bone scan will usually make the diagnosis.  TREATMENT AND HOME CARE INSTRUCTIONS  Treatment may or may not include a cast or walking shoe. When casts are needed the use is usually for short periods of time so as not to slow down healing with muscle wasting (atrophy).  Activities should be stopped until further advised by your caregiver.  Wear shoes with adequate shock absorbing capabilities and stiff soles.  Alternative exercise may be undertaken while waiting for healing. These may include bicycling and swimming, or as your caregiver suggests.  It is important to keep all follow-up visits or specialty referrals. The failure to keep these appointments could result in improper bone healing and chronic pain or disability.  Warning: Do not drive a car or operate a motor vehicle until your caregiver specifically tells you it is safe to do so. IF YOU DO NOT HAVE A CAST OR SPLINT:  You may walk on your injured foot as tolerated or advised.  Do not put any weight on your injured foot for as long as directed by your caregiver. Slowly increase the amount of time you walk on the foot as the pain allows or as advised.  Use crutches until you can bear weight without pain. A gradual increase in weight bearing may help.  Apply ice to the injury for 15-20 minutes each hour while awake for the first 2 days. Put the ice in a plastic bag and place a towel between the bag of ice and your skin.  Only take  over-the-counter or prescription medicines for pain, discomfort, or fever as directed by your caregiver. SEEK IMMEDIATE MEDICAL CARE IF:   Your cast gets damaged or breaks.  You have continued severe pain or more swelling than you did before the cast was put on, or the pain is not controlled with medications.  Your skin or nails below the injury turn blue or grey, or feel cold or numb.  There is a bad smell, or new stains or pus-like (purulent) drainage coming from the cast. MAKE SURE YOU:   Understand these instructions.  Will watch your condition.  Will get help right away if you are not doing well or get worse. Document Released: 03/28/2002 Document Revised: 09/28/2011 Document Reviewed: 02/17/2008 [facility] Patient Information 2015 [place], [place]. This information is not intended to replace advice given to you by your health care provider. Make sure you discuss any questions you have with your health care provider.

## 2014-04-29 NOTE — ED Notes (Signed)
Reports having pain.  No relief with meds given from ER.  States hydrocodone made her very dizzy.  Pt here requesting different meds for pain management.

## 2014-05-04 NOTE — ED Notes (Signed)
Patient left message on answering machine, about not being able to see ortho . As this is a WC case , left message for patient to contact HR to see who they want her to follow up with, to return to [facility] , or to see MD at [facility] . Message included statement that her access to care is not cut off, but that she does have options

## 2014-05-11 ENCOUNTER — Ambulatory Visit (HOSPITAL_COMMUNITY)
Admission: RE | Admit: 2014-05-11 | Discharge: 2014-05-11 | Disposition: A | Discharge status: Home / Self Care | Payer: Worker's Compensation | Source: Ambulatory Visit | Attending: Family Medicine | Admitting: Family Medicine

## 2014-05-11 ENCOUNTER — Other Ambulatory Visit (HOSPITAL_COMMUNITY): Payer: Self-pay | Admitting: Family Medicine

## 2014-05-11 DIAGNOSIS — M79661 Pain in right lower leg: Secondary | ICD-10-CM

## 2014-05-11 DIAGNOSIS — M7989 Other specified soft tissue disorders: Secondary | ICD-10-CM | POA: Insufficient documentation

## 2014-05-11 DIAGNOSIS — M79609 Pain in unspecified limb: Secondary | ICD-10-CM

## 2014-05-11 DIAGNOSIS — M79604 Pain in right leg: Secondary | ICD-10-CM | POA: Insufficient documentation

## 2014-05-11 NOTE — Progress Notes (Signed)
VASCULAR LAB PRELIMINARY  PRELIMINARY  PRELIMINARY  PRELIMINARY  Right lower extremity venous duplex completed.    Preliminary report:  Right leg is negative for deep and superficial vein thrombosis.  Report called to Dr. Rip Harbour.  Marybella Ethier, RVT 05/11/2014, 5:21 PM

## 2014-05-21 ENCOUNTER — Encounter (HOSPITAL_COMMUNITY): Payer: Self-pay | Admitting: Emergency Medicine

## 2014-06-23 ENCOUNTER — Encounter (HOSPITAL_COMMUNITY): Payer: Self-pay | Admitting: *Deleted

## 2014-06-23 ENCOUNTER — Emergency Department (HOSPITAL_COMMUNITY)
Admission: EM | Admit: 2014-06-23 | Discharge: 2014-06-23 | Disposition: A | Discharge status: Home / Self Care | Payer: BC Managed Care – PPO | Attending: Emergency Medicine | Admitting: Emergency Medicine

## 2014-06-23 DIAGNOSIS — Z87891 Personal history of nicotine dependence: Secondary | ICD-10-CM | POA: Insufficient documentation

## 2014-06-23 DIAGNOSIS — Z79899 Other long term (current) drug therapy: Secondary | ICD-10-CM | POA: Insufficient documentation

## 2014-06-23 DIAGNOSIS — F419 Anxiety disorder, unspecified: Secondary | ICD-10-CM | POA: Diagnosis not present

## 2014-06-23 DIAGNOSIS — Z8781 Personal history of (healed) traumatic fracture: Secondary | ICD-10-CM | POA: Insufficient documentation

## 2014-06-23 DIAGNOSIS — L0202 Furuncle of face: Secondary | ICD-10-CM | POA: Diagnosis not present

## 2014-06-23 DIAGNOSIS — Z791 Long term (current) use of non-steroidal anti-inflammatories (NSAID): Secondary | ICD-10-CM | POA: Insufficient documentation

## 2014-06-23 DIAGNOSIS — R51 Headache: Secondary | ICD-10-CM | POA: Diagnosis present

## 2014-06-23 DIAGNOSIS — Z7951 Long term (current) use of inhaled steroids: Secondary | ICD-10-CM | POA: Diagnosis not present

## 2014-06-23 MED ORDER — CLINDAMYCIN HCL 300 MG PO CAPS: 300.0000 mg | ORAL_CAPSULE | Freq: Three times a day (TID) | ORAL | Status: DC

## 2014-06-23 NOTE — ED Notes (Signed)
Security to drive patient and her rollscooter up to her car upon request.

## 2014-06-23 NOTE — ED Notes (Signed)
Swollen area to the lt lower jaw for MONTHS.  Today she bumped it and she felt like it was draining and was painful

## 2014-06-23 NOTE — Discharge Instructions (Signed)
You have an abscess on your face. There is no palpable pus pocket for me to drain today. Take Clindamycin 3 times a day. Apply warm compresses 3 times a day. You should start to see some improvement by Monday.  Follow up at urgent care as needed.

## 2014-06-23 NOTE — ED Provider Notes (Signed)
CSN: [id number]     Arrival date & time 06/23/14  1832 History   First MD Initiated Contact with Patient 06/23/14 1854     Chief Complaint  Patient presents with  . Facial Pain     (Consider location/radiation/quality/duration/timing/severity/associated sxs/prior Treatment) HPI  She is a 53 year old woman here for evaluation of facial swelling. She states she has had swelling on her left jaw 4 months. Then, 2 or 3 days ago she bumped it in the shower and had a small amount of purulent drainage from it. It is tender. She denies any fevers or chills. No dental issues. No nausea or vomiting.  Past Medical History  Diagnosis Date  . Anxiety   . Wrist fracture   . Infertility, female   . Myofascial pain syndrome   . Mild scoliosis   . History of degenerative disc disease     mild   Past Surgical History  Procedure Laterality Date  . Breast cyst excision Left   . Foot surgery Left 11/11    hammer toe & plantar fascitis  . Dilation and curettage of uterus      times 2  . Cystectomy      from neck  . Vein ligation and stripping     Family History  Problem Relation Age of Onset  . Cancer Mother     colon  . Hypertension Father   . Anxiety disorder Father   . Diabetes Maternal Grandfather    History  Substance Use Topics  . Smoking status: Former Research scientist (life sciences)  . Smokeless tobacco: Not on file  . Alcohol Use: No   OB History    Gravida Para Term Preterm AB TAB SAB Ectopic Multiple Living   2    2   2   0      Obstetric Comments   1 adopted children     Review of Systems  Constitutional: Negative for fever.  HENT: Positive for facial swelling.   Gastrointestinal: Negative for nausea and vomiting.      Allergies  Ciprofloxacin; Macrobid; and Septra  Home Medications   Prior to Admission medications   Medication Sig Start Date End Date Taking? Authorizing Provider  acetaminophen (TYLENOL) 500 MG tablet Take 500 mg by mouth every 8 (eight) hours as needed for mild  pain.    Historical Provider, MD  acetaminophen (TYLENOL) 500 MG tablet Take 1 tablet (500 mg total) by mouth every 6 (six) hours as needed. 04/27/14   Jennifer L Piepenbrink, PA-C  Calcium Carbonate-Vitamin D (CALCIUM + D PO) Take 1 tablet by mouth daily.    Historical Provider, MD  citalopram (CELEXA) 20 MG tablet Take 20 mg by mouth daily.    Historical Provider, MD  clindamycin (CLEOCIN) 300 MG capsule Take 1 capsule (300 mg total) by mouth 3 (three) times daily. 06/23/14   Melony Overly, MD  Docusate Calcium (STOOL SOFTENER PO) Apply 1 capsule topically at bedtime. Take every night per patient    Historical Provider, MD  Fiber TABS Take 1 tablet by mouth daily.     Historical Provider, MD  fluticasone (FLONASE) 50 MCG/ACT nasal spray Place 1 spray into both nostrils daily as needed for allergies or rhinitis.    Historical Provider, MD  Glucosamine HCl (GLUCOSAMINE PO) Take 2 tablets by mouth daily.     Historical Provider, MD  HYDROcodone-acetaminophen (NORCO/VICODIN) 5-325 MG per tablet Take 1 tablet by mouth every 6 (six) hours as needed. 04/29/14   Gregor Hams, MD  ibuprofen (ADVIL,MOTRIN) 800 MG tablet Take 800 mg by mouth every 8 (eight) hours as needed.    Historical Provider, MD  ibuprofen (ADVIL,MOTRIN) 800 MG tablet Take 1 tablet (800 mg total) by mouth 3 (three) times daily. 04/27/14   Jennifer L Piepenbrink, PA-C  Omega-3 Fatty Acids (FISH OIL PO) Take 1 capsule by mouth every evening.     Historical Provider, MD  traMADol (ULTRAM) 50 MG tablet Take 1 tablet (50 mg total) by mouth every 6 (six) hours as needed. 04/27/14   Jennifer L Piepenbrink, PA-C   BP 108/67 mmHg  Pulse 66  Temp(Src) 97.6 F (36.4 C) (Oral)  Resp 18  SpO2 97%  LMP 01/18/2012 Physical Exam  Constitutional: She is oriented to person, place, and time. She appears well-developed and well-nourished. No distress.  HENT:  Head: Normocephalic and atraumatic.    Mouth/Throat: Oropharynx is clear and moist.  Neck:  Neck supple.  Cardiovascular: Normal rate.   Pulmonary/Chest: Effort normal.  Neurological: She is alert and oriented to person, place, and time.    ED Course  Procedures (including critical care time) Labs Review Labs Reviewed - No data to display  Imaging Review No results found.   EKG Interpretation None      MDM   Final diagnoses:  Boil, face    May be infection of parotid gland versus boil. Clindamycin 300 mg 3 times a day for 10 days. Continue with warm compresses. Follow-up as scheduled at primary care office on Monday.    Melony Overly, MD 06/23/14 [phone]

## 2014-12-31 ENCOUNTER — Ambulatory Visit: Payer: BC Managed Care – PPO | Admitting: Neurology

## 2015-01-03 ENCOUNTER — Encounter: Payer: Self-pay | Admitting: Neurology

## 2015-01-03 ENCOUNTER — Ambulatory Visit (INDEPENDENT_AMBULATORY_CARE_PROVIDER_SITE_OTHER): Payer: BC Managed Care – PPO | Admitting: Neurology

## 2015-01-03 VITALS — BP 96/64 | HR 71 | Temp 98.1°F | Ht 68.0 in | Wt 180.6 lb

## 2015-01-03 DIAGNOSIS — G609 Hereditary and idiopathic neuropathy, unspecified: Secondary | ICD-10-CM

## 2015-01-03 DIAGNOSIS — M5417 Radiculopathy, lumbosacral region: Secondary | ICD-10-CM | POA: Diagnosis not present

## 2015-01-03 NOTE — Progress Notes (Addendum)
[facility]    Provider:  Dr Jaynee Eagles Referring Provider: Susa Day, MD Primary Care Physician:  Greta Doom  CC:  Low back pain  HPI:  Kristin Bass is a 54 y.o. female here as a referral from Dr. Tonita Cong for chronic low back pain. Symptoms started a few years ago. Worse when sitting. Tingling in the feet that is sometimes positional. If she is sitting and reading her bible, she gets tingling. Left> right. Her legs get restless. Symptoms are better when walking. She does well when laying, not worse at night. She has RLS and cramping overnight. Tight hose make symptoms worse. No symptoms in the fingers. No burning or numbness. No weakness in the feet. She broke her 5th metatarsal in the right foot and she is under workman's comp. Left is worse. If she lays a certain way, she can get radiating pain down the left outer side of the left leg. No family history of neuropathy. Denies any other focal neurologic symptoms.  Reviewed notes, labs and imaging from outside physicians, which showed: Patient was referred by [facility] orthopedics. Per their notes, she has been following with them for back pain. She describes leg pain and pain with sitting. They treated her with physical therapy and activity modification. Patient had an EMG nerve conduction study that was abnormal, slight decreased amplitude of the tibial motor conduction. They noted that she described stocking glove distribution below the knee. Thyroid was within normal limits. Symptoms may be secondary to the stockings, or possibly peripheral neuropathy or both. She also has symptoms of radiculopathy.  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 Personally reviewed images.  Review of Systems: Patient complains of symptoms per HPI as well as the following symptoms, no CP, no SOB. Pertinent negatives per HPI.  All others negative.   History   Social History  . Marital Status: Married    Spouse Name: N/A  . Number of Children: N/A  . Years of Education: N/A   Occupational History  . Not on file.   Social History Main Topics  . Smoking status: Former Research scientist (life sciences)  . Smokeless tobacco: Not on file  . Alcohol Use: No  . Drug Use: No  . Sexual Activity:    Partners: Male     Comment: husband vasectomy   Other Topics Concern  . Not on file   Social History Narrative    Family History  Problem Relation Age of Onset  . Cancer Mother     colon  . Hypertension Father   . Anxiety disorder Father   . Diabetes Maternal Grandfather     Past Medical History  Diagnosis Date  . Anxiety   . Wrist fracture   . Infertility, female   . Myofascial pain syndrome   . Mild scoliosis   . History of degenerative disc disease     mild    Past Surgical History  Procedure Laterality Date  . Breast cyst excision Left   . Foot surgery Left 11/11    hammer toe & plantar fascitis  . Dilation and curettage of uterus      times 2  . Cystectomy      from neck  . Vein ligation and stripping      Current Outpatient Prescriptions  Medication Sig Dispense Refill  . acetaminophen (TYLENOL) 500 MG tablet Take 500 mg by mouth every 8 (eight) hours  as needed for mild pain.    Marland Kitchen acetaminophen (TYLENOL) 500 MG tablet Take 1 tablet (500 mg total) by mouth every 6 (six) hours as needed. 30 tablet 0  . Calcium Carbonate-Vitamin D (CALCIUM + D PO) Take 1 tablet by mouth daily.    . citalopram (CELEXA) 20 MG tablet Take 20 mg by mouth daily.    . clindamycin (CLEOCIN) 300 MG capsule Take 1 capsule (300 mg total) by mouth 3 (three) times daily. 30 capsule 0  . Docusate Calcium (STOOL SOFTENER PO) Apply 1 capsule topically at bedtime. Take every night per patient    . Fiber TABS Take 1 tablet by mouth daily.     . fluticasone (FLONASE) 50 MCG/ACT nasal spray Place 1 spray into both nostrils daily as needed for  allergies or rhinitis.    . Glucosamine HCl (GLUCOSAMINE PO) Take 2 tablets by mouth daily.     Marland Kitchen HYDROcodone-acetaminophen (NORCO/VICODIN) 5-325 MG per tablet Take 1 tablet by mouth every 6 (six) hours as needed. 15 tablet 0  . ibuprofen (ADVIL,MOTRIN) 800 MG tablet Take 800 mg by mouth every 8 (eight) hours as needed.    Marland Kitchen ibuprofen (ADVIL,MOTRIN) 800 MG tablet Take 1 tablet (800 mg total) by mouth 3 (three) times daily. 21 tablet 0  . Omega-3 Fatty Acids (FISH OIL PO) Take 1 capsule by mouth every evening.     . traMADol (ULTRAM) 50 MG tablet Take 1 tablet (50 mg total) by mouth every 6 (six) hours as needed. 15 tablet 0   No current facility-administered medications for this visit.    Allergies as of 01/03/2015 - Review Complete 01/03/2015  Allergen Reaction Noted  . Ciprofloxacin Other (See Comments) 01/04/2013  . Macrobid [nitrofurantoin] Other (See Comments) 01/04/2013  . Septra [sulfamethoxazole-trimethoprim]  06/12/2013    Vitals: LMP 01/18/2012 Last Weight:  Wt Readings from Last 1 Encounters:  04/27/14 188 lb (85.276 kg)   Last Height:   Ht Readings from Last 1 Encounters:  04/27/14 5\' 8"  (1.727 m)   Physical exam: Exam: Gen: NAD, conversant, well nourised, well groomed                     CV: RRR, no MRG. No Carotid Bruits. No peripheral edema, warm, nontender Eyes: Conjunctivae clear without exudates or hemorrhage  Neuro: Detailed Neurologic Exam  Speech:    Speech is normal; fluent and spontaneous with normal comprehension.  Cognition:    The patient is oriented to person, place, and time;     recent and remote memory intact;     language fluent;     normal attention, concentration,     fund of knowledge Cranial Nerves:    The pupils are equal, round, and reactive to light. The fundi are flat. Visual fields are full to finger confrontation. Extraocular movements are intact. Trigeminal sensation is intact and the muscles of mastication are normal. The face  is symmetric. The palate elevates in the midline. Hearing intact. Voice is normal. Shoulder shrug is normal. The tongue has normal motion without fasciculations.   Coordination:    Normal finger to nose and heel to shin.   Gait:    Heel-toe are normal.   Motor Observation:    No asymmetry, no atrophy, and no involuntary movements noted. Tone:    Normal muscle tone.    Posture:    Posture is normal. normal erect    Strength:    Strength is V/V in the upper and lower  limbs.      Sensation: intact to LT, pin prick, proprioception and vibration     Reflex Exam:  DTR's:    Deep tendon reflexes in the upper and lower extremities are normal bilaterally.   Toes:    The toes are downgoing bilaterally.   Clonus:    Clonus is absent.       Assessment/Plan:  54 year old patient with chronic low back pain, left sided radicular symptoms, tingling in the toes of the left greater than right feet.  She also endorses RLS and cramping overnight. She wears compression stockings for vein issues which make the symptoms worse. Denies any weakness. No other focal neurologic symptoms. Her sensory exam is unremarkable and her Achilles reflexes are intact.  Suppose some of the symptoms(tingling in the toes/feet) could be caused by small fiber neuropathy versus tight hose compression or possibly both but distal clinical sensory exam is completely normal, reflexes are intact and a good portion of the description is not consistent with length-dependent axonal neuropathy such as better with walking and positional in nature, better with laying. We'll perform a thorough serum polyneuropathy screen.  If no risk factor for neuropathy is found, could consider epidermal nerve fiber testing.Could repeat EMG/NCS but she reports extreme discomfort during testing.   Addendum: Extensive serum workup did not find any risk factors for polyneuropathy.   Sarina Ill, MD  [facility] [address] [place], [place] [address]  Phone [phone] Fax [phone]

## 2015-01-03 NOTE — Patient Instructions (Signed)
I do want to suggest a few things today:   Remember to drink plenty of fluid, eat healthy meals and do not skip any meals. Try to eat protein with a every meal and eat a healthy snack such as fruit or nuts in between meals. Try to keep a regular sleep-wake schedule and try to exercise daily, particularly in the form of walking, 20-30 minutes a day, if you can.   As far as diagnostic testing: Labs  Our phone number is [phone]. We also have an after hours call service for urgent matters and there is a physician on-call for urgent questions. For any emergencies you know to call 911 or go to the nearest emergency room

## 2015-01-06 DIAGNOSIS — G609 Hereditary and idiopathic neuropathy, unspecified: Secondary | ICD-10-CM | POA: Insufficient documentation

## 2015-01-06 DIAGNOSIS — M5417 Radiculopathy, lumbosacral region: Secondary | ICD-10-CM | POA: Insufficient documentation

## 2015-01-07 ENCOUNTER — Telehealth: Payer: Self-pay | Admitting: Neurology

## 2015-01-07 NOTE — Telephone Encounter (Signed)
Spoke with pt to let her know, per Dr. Donnita Falls note, she talked with Dr. Jaynee Eagles about tingling and restlessness, denied burning or numbness. Pt stated she is going to call surgeon back to let him know. I told her to call back with further questions if needed. Told her we are open until 5pm and Dr. Jaynee Eagles out of office until Thursday. Pt verbalized understanding.

## 2015-01-07 NOTE — Telephone Encounter (Signed)
Patient called and requested to speak with Terrence Dupont RN regarding her previous appt. She would like to know if he leg was numb during her appt, she states that she cannot remember and it is number now. She is experiencing the numbness in the same place that she had vein laser surgery. She called her surgeon yesterday and he asked her some questions about swelling and movement and then he told her to take advil. She would like to touch base with Terrence Dupont RN regarding these issues. Please call and advise.

## 2015-01-07 NOTE — Telephone Encounter (Signed)
Returning pt call. Left VM to call back at [phone] and told her we are open until 5pm.

## 2015-01-07 NOTE — Telephone Encounter (Signed)
Thanks

## 2015-01-08 LAB — MULTIPLE MYELOMA PANEL, SERUM
Albumin SerPl Elph-Mcnc: 3.5 g/dL (ref 2.9–4.4)
Albumin/Glob SerPl: 1.3 (ref 0.7–1.7)
Alpha 1: 0.2 g/dL (ref 0.0–0.4)
Alpha2 Glob SerPl Elph-Mcnc: 0.6 g/dL (ref 0.4–1.0)
B-Globulin SerPl Elph-Mcnc: 1.1 g/dL (ref 0.7–1.3)
Gamma Glob SerPl Elph-Mcnc: 0.9 g/dL (ref 0.4–1.8)
Globulin, Total: 2.8 g/dL (ref 2.2–3.9)
IgA/Immunoglobulin A, Serum: 135 mg/dL (ref 87–352)
IgG (Immunoglobin G), Serum: 664 mg/dL — ABNORMAL LOW (ref 700–1600)
IgM (Immunoglobulin M), Srm: 166 mg/dL (ref 26–217)
Total Protein: 6.3 g/dL (ref 6.0–8.5)

## 2015-01-08 LAB — HEPATITIS C ANTIBODY: Hep C Virus Ab: <0.1 s/co ratio (ref 0.0–0.9)

## 2015-01-08 LAB — B12 AND FOLATE PANEL
Folate: 18.2 ng/mL (ref >3.0)
Vitamin B-12: 1534 pg/mL — ABNORMAL HIGH (ref 211–946)

## 2015-01-08 LAB — VITAMIN B1: Thiamine: 139.9 nmol/L (ref 66.5–200.0)

## 2015-01-08 LAB — RHEUMATOID FACTOR: Rhuematoid fact SerPl-aCnc: 7.9 IU/mL (ref 0.0–13.9)

## 2015-01-08 LAB — ANA W/REFLEX: Anti Nuclear Antibody(ANA): NEGATIVE

## 2015-01-08 LAB — GLIADIN ANTIBODIES, SERUM
Antigliadin Abs, IgA: 4 units (ref 0–19)
Gliadin IgG: 2 units (ref 0–19)

## 2015-01-08 LAB — HEMOGLOBIN A1C
Est. average glucose Bld gHb Est-mCnc: 117 mg/dL
Hgb A1c MFr Bld: 5.7 % — ABNORMAL HIGH (ref 4.8–5.6)

## 2015-01-08 LAB — VITAMIN E: Vitamin E (Alpha Tocopherol): 10.2 mg/L (ref 5.3–16.8)

## 2015-01-08 LAB — TSH: TSH: 1.19 u[IU]/mL (ref 0.450–4.500)

## 2015-01-08 LAB — MAGNESIUM: Magnesium: 2 mg/dL (ref 1.6–2.3)

## 2015-01-08 LAB — RPR: RPR Ser Ql: NONREACTIVE

## 2015-01-08 LAB — B. BURGDORFI ANTIBODIES: Lyme IgG/IgM Ab: <0.91 {ISR} (ref 0.00–0.90)

## 2015-01-08 LAB — ANA: ANA Titer 1: NEGATIVE

## 2015-01-08 LAB — HIV ANTIBODY (ROUTINE TESTING W REFLEX): HIV Screen 4th Generation wRfx: NONREACTIVE

## 2015-01-08 LAB — TISSUE TRANSGLUTAMINASE, IGA: Transglutaminase IgA: <2 U/mL (ref 0–3)

## 2015-01-08 LAB — ANGIOTENSIN CONVERTING ENZYME: Angio Convert Enzyme: 69 U/L (ref 14–82)

## 2015-01-08 LAB — VITAMIN B6: Vitamin B6: 46.4 ug/L — ABNORMAL HIGH (ref 2.0–32.8)

## 2015-01-08 NOTE — Telephone Encounter (Signed)
Patient called back and stated that she went to the doctor today and he told her that he does not feel it is caused from the laser surgery. Patient also stated that when she applies weight to the right side when sitting, she experiences pain. Please call and advise.

## 2015-01-08 NOTE — Telephone Encounter (Signed)
Left VM for pt to call us back.  Told her again that Dr. Jaynee Eagles is out of the office until Thursday and I will give her the message and we will call her back.

## 2015-01-11 ENCOUNTER — Telehealth: Payer: Self-pay | Admitting: Neurology

## 2015-01-11 MED ORDER — GABAPENTIN 300 MG PO CAPS
300.0000 mg | ORAL_CAPSULE | Freq: Three times a day (TID) | ORAL | Status: DC
Start: 2015-01-11 — End: 2015-05-31

## 2015-01-11 NOTE — Telephone Encounter (Signed)
She is having numbness in the left calf down to the heel, this is new. She wants to know if this is neuropathy. This is in the area where she had surgery. When she wears the compression hose, the tingling in her feet is worse. On the left leg, the tingling is the lateral aspect in the lower leg to the side of the foot. The right foot is currently ok but when she sits the right foot tingles. She also has burning in the feet but can't tell me the distribution, where the pain is or in what part of the legs or feet. However,she is fine when she does Pilates, no issues with the feet at that time. She is going to start a journal to tell me where the sensations are occurring in the feet and leg. Will start Neurontin in the meantime.  Need to contact Dr. Tonita Cong for the EMG/NCS. Terrence Dupont, can you request the emg/ncs data, the actual values.. Thank you.

## 2015-01-11 NOTE — Telephone Encounter (Signed)
Patient called back and requested to speak with Laurence Ferrari. Please call and advise.

## 2015-01-15 NOTE — Telephone Encounter (Signed)
Spoke with Anderson Malta at [facility] to have EMG/NCS results faxed over to [place]. Hanley Seamen [phone] for them to fax results. She said she will fax over results.

## 2015-01-15 NOTE — Telephone Encounter (Signed)
Thank you :)

## 2015-01-16 ENCOUNTER — Telehealth: Payer: Self-pay | Admitting: *Deleted

## 2015-01-16 NOTE — Telephone Encounter (Signed)
Spoke with pt about elevated HgbA1c at 5.7 (normal 4/8-5.6). Told her to watch how many carbs/sweets she is eating and to f/u with pcp. She asked me to fax records to Dr. Christoper Fabian and I told her she would have to sign a record release form. She wants me to mail this to her for her to sign. I told her to bring it back to our office once she completes it. She verbalized understanding.

## 2015-01-16 NOTE — Telephone Encounter (Signed)
Left VM for pt regarding normal lab work besides elevated HgbA1C which was 5.7 (normal being 4.8-5.6). Told her to limit the amount of carbs/sweets. Told her to follow up with PCP so they can monitor this. Told her to call back with further questions. Gave [facility] phone number.

## 2015-01-18 ENCOUNTER — Telehealth: Payer: Self-pay | Admitting: Neurology

## 2015-01-18 NOTE — Telephone Encounter (Signed)
Patient is calling to discuss medication gabapentin (NEURONTIN) 300 MG capsule. It seems the medication is not helping much. Please call.

## 2015-01-23 ENCOUNTER — Telehealth: Payer: Self-pay | Admitting: Neurology

## 2015-01-23 NOTE — Telephone Encounter (Signed)
Called patient back, left message. Will try again

## 2015-01-23 NOTE — Telephone Encounter (Signed)
Called pt to let her know we were off last Friday and Dr. Jaynee Eagles did not get back into the office until today. She stated she takes gabapentin 300mg  (1 capsule) during they day because otherwise it makes her too sleepy and takes 2 capsules at night before bed. She is having to take Ambien to help her sleep at night but would like to avoid this since "my husband was a drug addict". She states "I cannot wear shoes because it makes the tingling worse/pain and pulling the sheets over her left put makes symptoms worse also". She said she is going to an appt around 2:00pm today and she will be back at home after 4:00pm. Told her I will let Dr. Jaynee Eagles know and she will call her back. Pt verbalized understanding.

## 2015-01-23 NOTE — Telephone Encounter (Signed)
Patient called stating medication is not helping. She has to take Ambien to sleep as the left foot and ankle are numb but she does not want to take it every night. She states she can not wear a shoe for the tingling is so severe. Please call and advise. Patient can be reached at [phone] before 11 and after 4pm today.

## 2015-01-29 ENCOUNTER — Telehealth: Payer: Self-pay | Admitting: Neurology

## 2015-01-29 NOTE — Telephone Encounter (Signed)
Patient is calling as she called her vein doctor,  Dr. Jones Skene who is waiting to hear from Dr. Jaynee Eagles to discuss her case so Dr. Vista Lawman can advise patient as to how to move forward.  She is now taking sleeping pills every night. Phone # is [phone].  Thanks!

## 2015-01-30 ENCOUNTER — Other Ambulatory Visit: Payer: Self-pay | Admitting: Neurology

## 2015-01-30 DIAGNOSIS — G2581 Restless legs syndrome: Secondary | ICD-10-CM

## 2015-01-30 MED ORDER — ROTIGOTINE 1 MG/24HR TD PT24: 1.0000 mg | MEDICATED_PATCH | Freq: Every day | TRANSDERMAL | Status: DC

## 2015-01-30 NOTE — Telephone Encounter (Signed)
We only have the 2mg  patches. I will ask Dr. Jaynee Eagles what she would like to do.

## 2015-01-30 NOTE — Telephone Encounter (Signed)
Spoke to patient today extensively. Asked her to document her symptoms as they happen. She documented that When reading the bible in the morning, The symptoms are on the right >left leg around the calf area. Not in the toes, not on the side of foot. Not on the bottom of the foot. Also a little on the top of the foot. If she sits long enough it will go to the knee area. And up the thigh. Tingling. Changing positions helps. Discussed at length, doesn't fit for a length-dependent peripheral polyneuropathy which would involve more of the foot and not position dependent. She also complains of pain in the heels worse when walking. Extensive lab workup was negative for risk factors for polyneuropathy.   At night, she feels like she has move the leg constantly. Will leave samples of Neurpro patch 1mg /24hours at the front desk and test Ferritin for RLS. Explained use, place patch at bedtime and discard in the morning. Terrence Dupont, will you leave her a box at the front please?  Left message for Dr. Jones Skene to discuss next steps. Will cc Dr. Tonita Cong just as an fyi, he was evaluating her for radiculopathy Also discussed repeating emg/ncs for polyneuropathy and possible epidermal nerve fiber biopsy for eval of small-fiber neuropathy with patient which may be low yield, she declined

## 2015-01-31 ENCOUNTER — Other Ambulatory Visit: Payer: Self-pay | Admitting: Neurology

## 2015-01-31 ENCOUNTER — Other Ambulatory Visit (INDEPENDENT_AMBULATORY_CARE_PROVIDER_SITE_OTHER): Payer: Self-pay

## 2015-01-31 DIAGNOSIS — G2581 Restless legs syndrome: Secondary | ICD-10-CM

## 2015-01-31 DIAGNOSIS — Z0289 Encounter for other administrative examinations: Secondary | ICD-10-CM

## 2015-01-31 MED ORDER — ROTIGOTINE 1 MG/24HR TD PT24: 1.0000 mg | MEDICATED_PATCH | Freq: Every day | TRANSDERMAL | Status: DC

## 2015-01-31 NOTE — Telephone Encounter (Signed)
Sample, Rx, discount card and free trail card given to patient this morning.

## 2015-02-01 ENCOUNTER — Telehealth: Payer: Self-pay | Admitting: Neurology

## 2015-02-01 LAB — FERRITIN: Ferritin: 106 ng/mL (ref 15–150)

## 2015-02-01 NOTE — Telephone Encounter (Signed)
I spoke to patient and she is aware of information below. She knows that Terrence Dupont will call next week to set up.

## 2015-02-01 NOTE — Telephone Encounter (Signed)
Pt called and said she tried the patches and they intensified her symptoms, she got up and took the patches off at 3 am. Please call and advise. [phone]

## 2015-02-01 NOTE — Telephone Encounter (Signed)
Then it is not restless leg. Then it is likely vascular like Dr. Jones Skene suggested. We will set up an emg/ncs next week.  Kristin Bass dyou call Ms. Klare next week and set it up? Give me some extra time. thanks

## 2015-02-01 NOTE — Telephone Encounter (Signed)
I spoke to patient. She wore the patch 1 night and took it off at 3am. She felt that it intensified her symptoms. Kristin Bass reports that she will not try the patch again. What would you recommend?

## 2015-02-06 ENCOUNTER — Other Ambulatory Visit: Payer: Self-pay | Admitting: *Deleted

## 2015-02-06 DIAGNOSIS — G609 Hereditary and idiopathic neuropathy, unspecified: Secondary | ICD-10-CM

## 2015-02-06 NOTE — Telephone Encounter (Signed)
Spoke w/ Larene Beach Bullion. She called and left VM on secondary #. Unable to leave VM on primary d/t full inbox.

## 2015-02-06 NOTE — Telephone Encounter (Signed)
Thanks, we can wait until she calls back.

## 2015-02-14 NOTE — Telephone Encounter (Signed)
Pt has appt set up for 02/26/15.

## 2015-02-26 ENCOUNTER — Ambulatory Visit (INDEPENDENT_AMBULATORY_CARE_PROVIDER_SITE_OTHER): Payer: Self-pay | Admitting: Neurology

## 2015-02-26 ENCOUNTER — Ambulatory Visit (INDEPENDENT_AMBULATORY_CARE_PROVIDER_SITE_OTHER): Payer: BC Managed Care – PPO | Admitting: Neurology

## 2015-02-26 DIAGNOSIS — G609 Hereditary and idiopathic neuropathy, unspecified: Secondary | ICD-10-CM | POA: Diagnosis not present

## 2015-02-26 DIAGNOSIS — Z0289 Encounter for other administrative examinations: Secondary | ICD-10-CM

## 2015-02-26 DIAGNOSIS — R202 Paresthesia of skin: Secondary | ICD-10-CM

## 2015-02-26 NOTE — Progress Notes (Signed)
See procedure note.

## 2015-02-26 NOTE — Progress Notes (Signed)
  [facility]    Provider:  Dr Jaynee Eagles Referring Provider: Antony Contras, MD Primary Care Physician:  Gara Kroner, MD  HPI: Kristin Bass is a 54 y.o. female here as a referral for evaluation of peripheral polyneuropathy. She describes tingling in the feet that is sometimes positional. If she is sitting and reading her bible, she gets tingling. Left> right. She is having numbness in the left calf down to the heel,   On the left leg, the tingling is in the lateral aspect in the lower leg to the side of the foot. Her legs get restless but a trial of Neupro at bedtime worsened her symptoms. Symptoms are better when walking. Symptoms also worse when laying in bed. She also complains of pain in the heels worse when walking Tight hose make symptoms worse. No symptoms in the fingers. No burning. No weakness in the feet. If she lays a certain way, she can get radiating pain down the left outer side of the left leg. No family history of neuropathy. Denies any other focal neurologic symptoms. She says symptoms have started to improve recently.   Summary  Nerve conduction studies were performed on the bilateral lower extremities:  The bilateral Peroneal motor nerves showed normal conductions with normal F Wave latencies The bilateral Tibial motor nerves showed normal conductions with normal F Wave latencies The bilateral Sural sensory nerves were within normal limits The bilateral Superficial Peroneal sensory nerves were within normal limits Bilateral H Reflexes showed normal latencies  EMG Needle study was performed on selected left lower extremity muscles:   The Vastus Medialis, Anterior Tibialis, Medial Gastrocnemius, Extensor Hallucis Longus and Abductor Hallucis muscles were within normal limits.  Conclusion: This is a normal study. No electrophysiologic evidence for peripheral polyneuropathy. Clinical correlation recommended.    CC: Dr Antony Contras  Dr Susa Day Dr.  Sherald Hess, MD  [facility] [address] [place], [place] [address]  Phone [phone] Fax [phone]

## 2015-02-27 NOTE — Procedures (Signed)
[facility]    Provider:  Dr Jaynee Eagles Referring Provider: Antony Contras, MD Primary Care Physician:  Gara Kroner, MD  HPI: Kristin Bass is a 54 y.o. female here as a referral for evaluation of peripheral polyneuropathy. She describes tingling in the feet that is sometimes positional. If she is sitting and reading her bible, she gets tingling. Left> right. She is having numbness in the left calf down to the heel,   On the left leg, the tingling is in the lateral aspect in the lower leg to the side of the foot. Her legs get restless but a trial of Neupro at bedtime worsened her symptoms. Symptoms are better when walking. Symptoms also worse when laying in bed. She also complains of pain in the heels worse when walking Tight hose make symptoms worse. No symptoms in the fingers. No burning. No weakness in the feet. If she lays a certain way, she can get radiating pain down the left outer side of the left leg. No family history of neuropathy. Denies any other focal neurologic symptoms. She says symptoms have started to improve recently.   Summary  Nerve conduction studies were performed on the bilateral lower extremities:  The bilateral Peroneal motor nerves showed normal conductions with normal F Wave latencies The bilateral Tibial motor nerves showed normal conductions with normal F Wave latencies The bilateral Sural sensory nerves were within normal limits The bilateral Superficial Peroneal sensory nerves were within normal limits Bilateral H Reflexes showed normal latencies  EMG Needle study was performed on selected left lower extremity muscles:   The Vastus Medialis, Anterior Tibialis, Medial Gastrocnemius, Extensor Hallucis Longus and Abductor Hallucis muscles were within normal limits.  Conclusion: This is a normal study. No electrophysiologic evidence for peripheral polyneuropathy. Clinical correlation recommended.    CC: Dr Antony Contras  Dr Susa Day Dr.  Sherald Hess, MD  [facility] [address] [place], [place] [address]  Phone [phone] Fax [phone]

## 2015-03-19 ENCOUNTER — Telehealth: Payer: Self-pay | Admitting: *Deleted

## 2015-03-19 NOTE — Telephone Encounter (Signed)
Patient medical records mailed out on 03/19/15 to patient home address.

## 2015-05-17 NOTE — Telephone Encounter (Signed)
Error

## 2015-05-31 ENCOUNTER — Encounter: Payer: Self-pay | Admitting: Certified Nurse Midwife

## 2015-05-31 ENCOUNTER — Ambulatory Visit (INDEPENDENT_AMBULATORY_CARE_PROVIDER_SITE_OTHER): Payer: BC Managed Care – PPO | Admitting: Certified Nurse Midwife

## 2015-05-31 VITALS — BP 106/70 | HR 72 | Resp 16 | Ht 68.25 in | Wt 185.0 lb

## 2015-05-31 DIAGNOSIS — Z0000 Encounter for general adult medical examination without abnormal findings: Secondary | ICD-10-CM

## 2015-05-31 DIAGNOSIS — Z01419 Encounter for gynecological examination (general) (routine) without abnormal findings: Secondary | ICD-10-CM

## 2015-05-31 LAB — POCT URINALYSIS DIPSTICK
Bilirubin, UA: NEGATIVE
Blood, UA: NEGATIVE
Glucose, UA: NEGATIVE
Ketones, UA: NEGATIVE
Leukocytes, UA: NEGATIVE
Nitrite, UA: NEGATIVE
Protein, UA: NEGATIVE
Urobilinogen, UA: NEGATIVE
pH, UA: 5

## 2015-05-31 LAB — TSH: TSH: 0.89 u[IU]/mL (ref 0.350–4.500)

## 2015-05-31 LAB — HEMOGLOBIN, FINGERSTICK: Hemoglobin, fingerstick: 12.6 g/dL (ref 12.0–16.0)

## 2015-05-31 NOTE — Patient Instructions (Signed)

## 2015-05-31 NOTE — Progress Notes (Signed)
54 y.o. G21P0020 Married  Caucasian Fe here for annual exam. Menopausal no HRT, denies vaginal bleeding or vaginal dryness. Still having hot flashes and night sweats and is sleeping with fan. Taking Effexor which is helping with symptoms. Daughter now is living grandmother and is working better for patient. Not sexually active now and no problems with due to pain in past with pump spouse must use. Seeing Neurology for left leg pain, nerve impingement. Sees PCP for labs and aex and anxiety medication management. No  Other health issues today.  Patient's last menstrual period was 01/18/2012.          Sexually active: No.  The current method of family planning is vasectomy.    Exercising: Yes.    pilates Smoker:  no  Health Maintenance: Pap: 04-04-14 neg MMG:  01-22-15 category a density,birads 1:neg Colonoscopy: 2014 neg BMD:   ? Unsure of when TDaP:  2014 Labs: poct urine-neg, hgb-12.6 Self breast exam: not done   reports that she has quit smoking. She does not have any smokeless tobacco history on file. She reports that she does not drink alcohol or use illicit drugs.  Past Medical History  Diagnosis Date  . Anxiety   . Wrist fracture   . Infertility, female   . Myofascial pain syndrome   . Mild scoliosis   . History of degenerative disc disease     mild    Past Surgical History  Procedure Laterality Date  . Breast cyst excision Left   . Foot surgery Left 11/11    hammer toe & plantar fascitis  . Dilation and curettage of uterus      times 2  . Cystectomy      from neck  . Vein ligation and stripping      Current Outpatient Prescriptions  Medication Sig Dispense Refill  . acetaminophen (TYLENOL) 500 MG tablet Take 500 mg by mouth every 8 (eight) hours as needed for mild pain.    . Calcium Carbonate-Vitamin D (CALCIUM + D PO) Take 1 tablet by mouth daily.    Mariane Baumgarten Calcium (STOOL SOFTENER PO) Apply 1 capsule topically at bedtime. Take every night per patient    .  fluticasone (FLONASE) 50 MCG/ACT nasal spray Place 1 spray into both nostrils daily as needed for allergies or rhinitis.    Marland Kitchen HORSE CHESTNUT PO Take by mouth 2 (two) times daily.    Marland Kitchen ibuprofen (ADVIL,MOTRIN) 800 MG tablet Take 800 mg by mouth every 8 (eight) hours as needed.    Marland Kitchen MAGNESIUM PO Take 1 tablet by mouth daily.    . Omega-3 Fatty Acids (FISH OIL PO) Take 1 capsule by mouth every evening.     . TURMERIC PO Take by mouth daily.    . Venlafaxine HCl (EFFEXOR PO) Take by mouth daily.    Marland Kitchen UNABLE TO FIND Apply 1 application topically daily. Med Name: Magnesium oil     No current facility-administered medications for this visit.    Family History  Problem Relation Age of Onset  . Colon cancer Mother   . Hypertension Father   . Anxiety disorder Father   . Diabetes Maternal Grandfather   . Hypertension Maternal Grandmother     ROS:  Pertinent items are noted in HPI.  Otherwise, a comprehensive ROS was negative.  Exam:   BP 106/70 mmHg  Pulse 72  Resp 16  Ht 5' 8.25" (1.734 m)  Wt 185 lb (83.915 kg)  BMI 27.91 kg/m2  LMP 01/18/2012  Height: 5' 8.25" (173.4 cm) Ht Readings from Last 3 Encounters:  05/31/15 5' 8.25" (1.734 m)  01/03/15 5\' 8"  (1.727 m)  04/27/14 5\' 8"  (1.727 m)    General appearance: alert, cooperative and appears stated age Head: Normocephalic, without obvious abnormality, atraumatic Neck: no adenopathy, supple, symmetrical, trachea midline and thyroid normal to inspection and palpation Lungs: clear to auscultation bilaterally Breasts: normal appearance, no masses or tenderness, No nipple retraction or dimpling, No nipple discharge or bleeding, No axillary or supraclavicular adenopathy Heart: regular rate and rhythm Abdomen: soft, non-tender; no masses,  no organomegaly Extremities: extremities normal, atraumatic, no cyanosis or edema Skin: Skin color, texture, turgor normal. No rashes or lesions Lymph nodes: Cervical, supraclavicular, and axillary nodes  normal. No abnormal inguinal nodes palpated Neurologic: Grossly normal   Pelvic: External genitalia:  no lesions              Urethra:  normal appearing urethra with no masses, tenderness or lesions              Bartholin's and Skene's: normal                 Vagina: normal appearing vagina with normal color and discharge, no lesions              Cervix: normal,non tender, no lesions              Pap taken: No. Bimanual Exam:  Uterus:  normal size, contour, position, consistency, mobility, non-tender              Adnexa: normal adnexa and no mass, fullness, tenderness               Rectovaginal: Confirms               Anus:  normal sphincter tone, no lesions  Chaperone present: yes  A:  Well Woman with normal exam  Menopausal no HRT  Anxiety with PCP management  Neurology management of left leg pain  P:   Reviewed health and wellness pertinent to exam  Aware of need to evaluate if vaginal bleeding  Continue follow up with PCP as indicated  Pap smear as above not taken   counseled on breast self exam, mammography screening, adequate intake of calcium and vitamin D, diet and exercise, Kegel's exercises  return annually or prn  An After Visit Summary was printed and given to the patient.

## 2015-05-31 NOTE — Progress Notes (Signed)
Reviewed personally.  M. Suzanne Tian Davison, MD.  

## 2015-06-01 LAB — VITAMIN D 25 HYDROXY (VIT D DEFICIENCY, FRACTURES): Vit D, 25-Hydroxy: 48 ng/mL (ref 30–100)

## 2015-08-25 ENCOUNTER — Emergency Department (HOSPITAL_COMMUNITY)
Admission: EM | Admit: 2015-08-25 | Discharge: 2015-08-25 | Disposition: A | Discharge status: Home / Self Care | Payer: BC Managed Care – PPO | Attending: Emergency Medicine | Admitting: Emergency Medicine

## 2015-08-25 ENCOUNTER — Encounter (HOSPITAL_COMMUNITY): Payer: Self-pay

## 2015-08-25 ENCOUNTER — Emergency Department (HOSPITAL_COMMUNITY): Payer: BC Managed Care – PPO

## 2015-08-25 DIAGNOSIS — R52 Pain, unspecified: Secondary | ICD-10-CM

## 2015-08-25 DIAGNOSIS — M79605 Pain in left leg: Secondary | ICD-10-CM | POA: Diagnosis present

## 2015-08-25 DIAGNOSIS — R200 Anesthesia of skin: Secondary | ICD-10-CM | POA: Insufficient documentation

## 2015-08-25 DIAGNOSIS — G8929 Other chronic pain: Secondary | ICD-10-CM | POA: Insufficient documentation

## 2015-08-25 DIAGNOSIS — Z87891 Personal history of nicotine dependence: Secondary | ICD-10-CM | POA: Insufficient documentation

## 2015-08-25 DIAGNOSIS — M25552 Pain in left hip: Secondary | ICD-10-CM | POA: Diagnosis not present

## 2015-08-25 DIAGNOSIS — Z8781 Personal history of (healed) traumatic fracture: Secondary | ICD-10-CM | POA: Diagnosis not present

## 2015-08-25 DIAGNOSIS — Z8742 Personal history of other diseases of the female genital tract: Secondary | ICD-10-CM | POA: Insufficient documentation

## 2015-08-25 DIAGNOSIS — Z79899 Other long term (current) drug therapy: Secondary | ICD-10-CM | POA: Insufficient documentation

## 2015-08-25 MED ORDER — IBUPROFEN 800 MG PO TABS: 800.0000 mg | ORAL_TABLET | Freq: Three times a day (TID) | ORAL | Status: DC

## 2015-08-25 MED ORDER — KETOROLAC TROMETHAMINE 30 MG/ML IJ SOLN
10.0000 mg | Freq: Once | INTRAMUSCULAR | Status: AC
  Administered 2015-08-25: 9.9 mg via INTRAVENOUS
  Filled 2015-08-25: qty 1

## 2015-08-25 MED ORDER — CYCLOBENZAPRINE HCL 10 MG PO TABS: 10.0000 mg | ORAL_TABLET | Freq: Two times a day (BID) | ORAL | Status: DC | PRN

## 2015-08-25 NOTE — ED Notes (Signed)
Pt. Coming from home today c/o left lower back pain that radiates to left leg. Pt. Hx of lower disc dx, bursitis in left hip, and vein surgery bilaterally. Pt. Unable to get an appt with her orthopedic MD. Pt. Only c/o pain 8/10 with movement.

## 2015-08-25 NOTE — ED Provider Notes (Signed)
CSN: [id number]     Arrival date & time 08/25/15  0717 History   First MD Initiated Contact with Patient 08/25/15 0740     Chief Complaint  Patient presents with  . Leg Pain     (Consider location/radiation/quality/duration/timing/severity/associated sxs/prior Treatment) Patient is a 55 y.o. female presenting with leg pain.  Leg Pain Location:  Hip Injury: no   Hip location:  L hip Pain details:    Quality:  Sharp and aching   Radiates to:  Does not radiate   Severity:  Moderate   Onset quality:  Gradual   Duration:  3 days   Timing:  Constant   Progression:  Worsening Chronicity:  Recurrent Dislocation: no   Foreign body present:  No foreign bodies Prior injury to area:  No Relieved by:  Heat Worsened by:  Abduction Ineffective treatments:  None tried Associated symptoms: numbness (chronic, posterior distal calf)   Associated symptoms: no back pain, no fever and no neck pain     Past Medical History  Diagnosis Date  . Anxiety   . Wrist fracture   . Infertility, female   . Myofascial pain syndrome   . Mild scoliosis   . History of degenerative disc disease     mild   Past Surgical History  Procedure Laterality Date  . Breast cyst excision Left   . Foot surgery Left 11/11    hammer toe & plantar fascitis  . Dilation and curettage of uterus      times 2  . Cystectomy      from neck  . Vein ligation and stripping     Family History  Problem Relation Age of Onset  . Colon cancer Mother   . Hypertension Father   . Anxiety disorder Father   . Diabetes Maternal Grandfather   . Hypertension Maternal Grandmother   . COPD Maternal Aunt   . Stroke Maternal Aunt   . Lung cancer Maternal Aunt    Social History  Substance Use Topics  . Smoking status: Former Research scientist (life sciences)  . Smokeless tobacco: None  . Alcohol Use: No   OB History    Gravida Para Term Preterm AB TAB SAB Ectopic Multiple Living   2    2   2   0      Obstetric Comments   1 adopted children      Review of Systems  Constitutional: Negative for fever.  Musculoskeletal: Positive for gait problem (slightly, improved with motion). Negative for back pain and neck pain.  Skin: Negative for rash and wound.  All other systems reviewed and are negative.     Allergies  Ciprofloxacin; Macrobid; and Septra  Home Medications   Prior to Admission medications   Medication Sig Start Date End Date Taking? Authorizing Provider  acetaminophen (TYLENOL) 500 MG tablet Take 500 mg by mouth every 8 (eight) hours as needed for mild pain.    Historical Provider, MD  Calcium Carbonate-Vitamin D (CALCIUM + D PO) Take 1 tablet by mouth daily.    Historical Provider, MD  cyclobenzaprine (FLEXERIL) 10 MG tablet Take 1 tablet (10 mg total) by mouth 2 (two) times daily as needed for muscle spasms. 08/25/15   Merrily Pew, MD  Docusate Calcium (STOOL SOFTENER PO) Apply 1 capsule topically at bedtime. Take every night per patient    Historical Provider, MD  fluticasone (FLONASE) 50 MCG/ACT nasal spray Place 1 spray into both nostrils daily as needed for allergies or rhinitis.    Historical Provider, MD  HORSE CHESTNUT PO Take by mouth 2 (two) times daily.    Historical Provider, MD  ibuprofen (ADVIL,MOTRIN) 800 MG tablet Take 1 tablet (800 mg total) by mouth 3 (three) times daily. 08/25/15   Merrily Pew, MD  MAGNESIUM PO Take 1 tablet by mouth daily.    Historical Provider, MD  Omega-3 Fatty Acids (FISH OIL PO) Take 1 capsule by mouth every evening.     Historical Provider, MD  TURMERIC PO Take by mouth daily.    Historical Provider, MD  UNABLE TO FIND Apply 1 application topically daily. Med Name: Magnesium oil    Historical Provider, MD  Venlafaxine HCl (EFFEXOR PO) Take by mouth daily.    Historical Provider, MD   BP 115/77 mmHg  Pulse 71  Temp(Src) 97.9 F (36.6 C) (Oral)  Resp 14  Ht 5\' 8"  (1.727 m)  Wt 180 lb (81.647 kg)  BMI 27.38 kg/m2  SpO2 100%  LMP 01/18/2012 Physical Exam  Constitutional:  She is oriented to person, place, and time. She appears well-developed and well-nourished.  HENT:  Head: Normocephalic and atraumatic.  Neck: Normal range of motion.  Cardiovascular: Normal rate and regular rhythm.   Pulmonary/Chest: Effort normal. No stridor. No respiratory distress. She has no rales.  Abdominal: She exhibits no distension.  Musculoskeletal: Normal range of motion.  Pain with abduction of left hip  Neurological: She is alert and oriented to person, place, and time.  Nursing note and vitals reviewed.   ED Course  Procedures (including critical care time) Labs Review Labs Reviewed - No data to display  Imaging Review Dg Pelvis 1-2 Views  08/25/2015  CLINICAL DATA:  Acute left hip pain without known injury. EXAM: PELVIS - 1-2 VIEW COMPARISON:  None. FINDINGS: There is no evidence of pelvic fracture or diastasis. No pelvic bone lesions are seen. IMPRESSION: Normal pelvis. Electronically Signed   By: Marijo Conception, M.D.   On: 08/25/2015 09:22   Dg Femur Min 2 Views Left  08/25/2015  CLINICAL DATA:  Pt complains of left hip/thigh pain x Thursday; no known injury or h/o surgery EXAM: LEFT FEMUR 2 VIEWS COMPARISON:  None. FINDINGS: No fracture dislocation LEFT femur.  No soft tissue abnormality. IMPRESSION: No acute osseous abnormality. Electronically Signed   By: Suzy Bouchard M.D.   On: 08/25/2015 09:21   I have personally reviewed and evaluated these images and lab results as part of my medical decision-making.   EKG Interpretation None      MDM   Final diagnoses:  Pain of left lower extremity   Atraumatic left lateral femur pain near hip, suspect overuse typei njury however will xr to ensure no pathologic fracture or other causes adn likely institute conservative management with pcp follow up  Patient has no evidence of fracture on x-rays. Patient has ambulated around the department multiple times since arrival. Pelvic the symptoms are either arthritic or  muscular in nature. Patient is stable for discharge on conservative therapy with orthopedic follow-up at this time.    Merrily Pew, MD 08/25/15 [phone]

## 2015-08-25 NOTE — ED Notes (Signed)
EDP at bedside  

## 2015-12-14 IMAGING — CR DG LUMBAR SPINE 2-3V
3 series · 3 of 3 positions shown · non-contrast
Comparison: No priors.

CLINICAL DATA: Right-sided low back pain with radiation into the
buttocks over the past several weeks. Prior history of a fall.

EXAM:
LUMBAR SPINE - 2-3 VIEW

[view not recorded (1 of 3)]
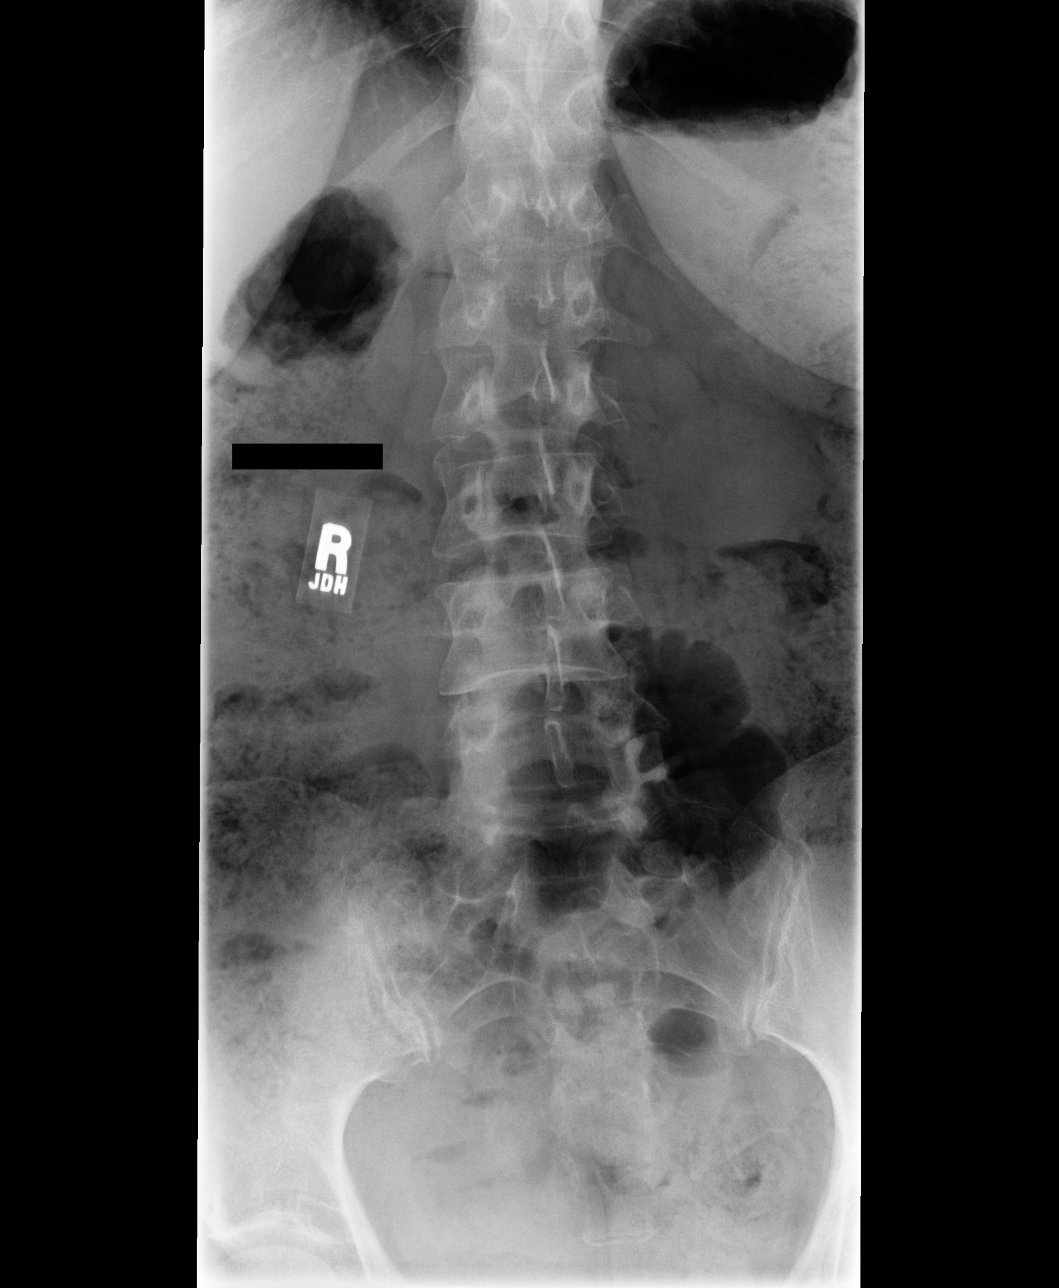

[view not recorded (2 of 3)]
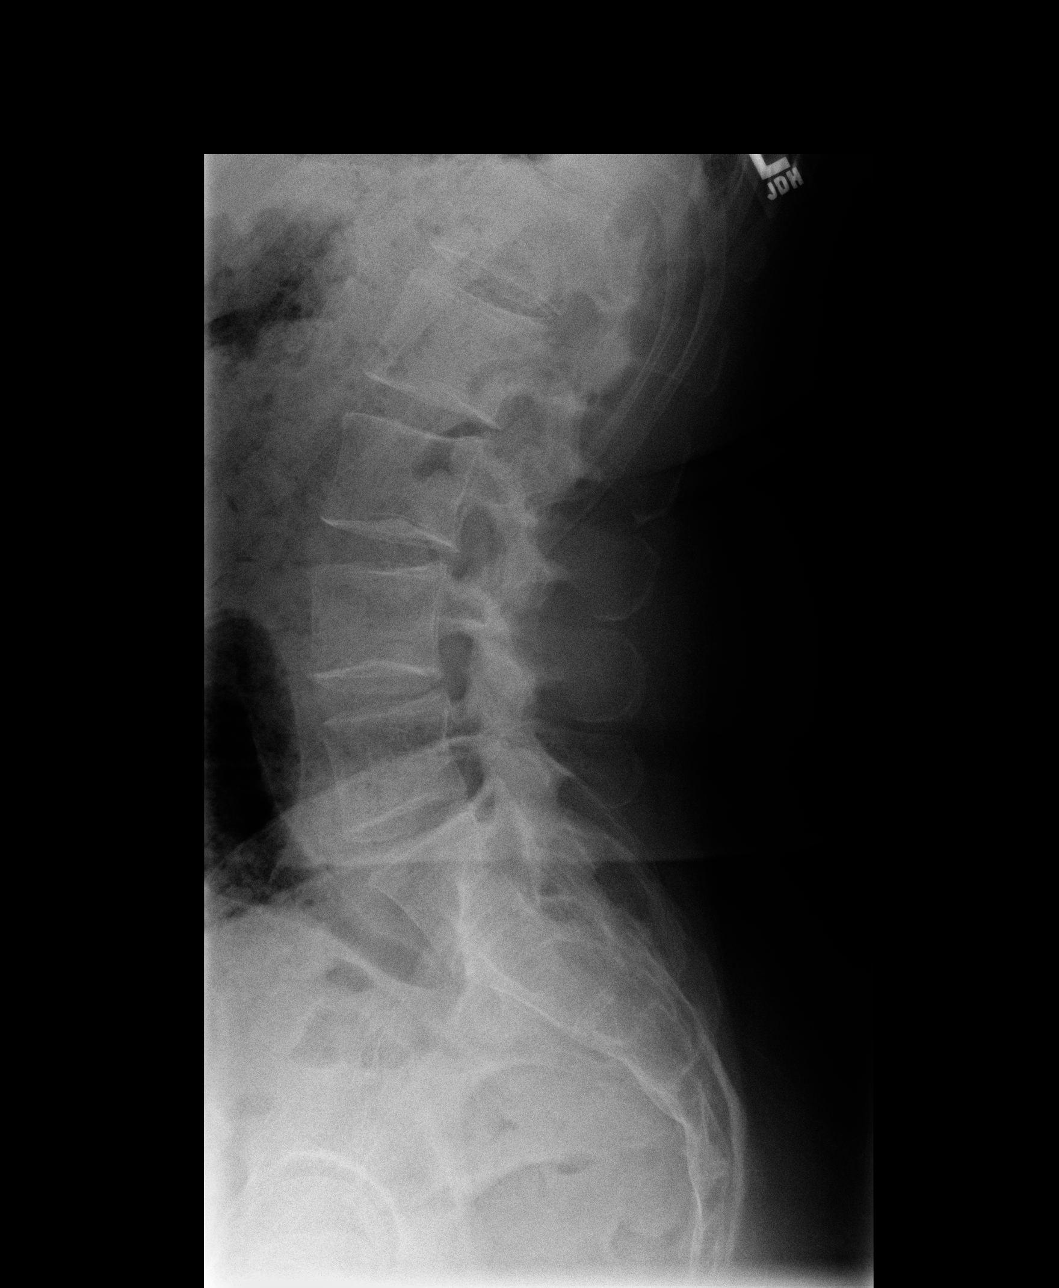

[view not recorded (3 of 3)]
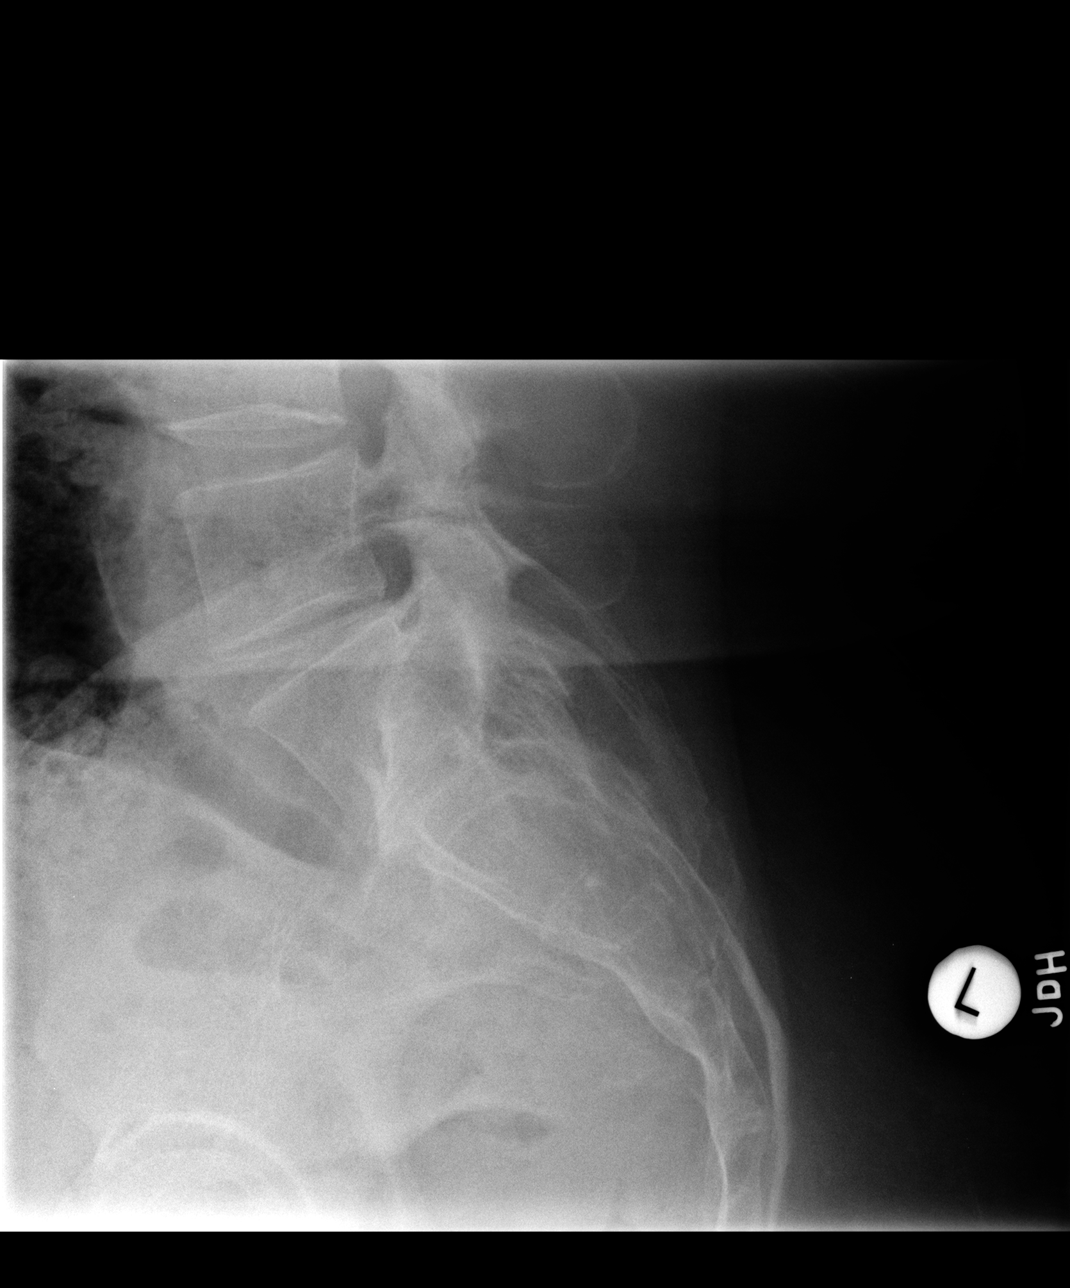

[3 of 3 positions shown; findings below may reference images not displayed]

FINDINGS: Three views of the lumbar spine demonstrate no definite acute
displaced fractures or compression type fractures. Mild exaggeration
of normal lumbar lordosis. Alignment is otherwise anatomic. Mild
multiple degenerative disc disease. Mild multilevel facet
arthropathy, most severe at L5-S1.
IMPRESSION: 1. No acute radiographic abnormality of the lumbar spine.

## 2016-02-07 ENCOUNTER — Encounter: Payer: Self-pay | Admitting: Certified Nurse Midwife

## 2016-02-07 ENCOUNTER — Ambulatory Visit (INDEPENDENT_AMBULATORY_CARE_PROVIDER_SITE_OTHER): Payer: BC Managed Care – PPO | Admitting: Certified Nurse Midwife

## 2016-02-07 VITALS — BP 106/70 | HR 72 | Resp 16 | Ht 68.25 in | Wt 189.0 lb

## 2016-02-07 DIAGNOSIS — F526 Dyspareunia not due to a substance or known physiological condition: Secondary | ICD-10-CM | POA: Diagnosis not present

## 2016-02-07 DIAGNOSIS — N952 Postmenopausal atrophic vaginitis: Secondary | ICD-10-CM

## 2016-02-07 DIAGNOSIS — IMO0001 Reserved for inherently not codable concepts without codable children: Secondary | ICD-10-CM

## 2016-02-07 MED ORDER — ESTRADIOL 10 MCG VA TABS: ORAL_TABLET | VAGINAL | Status: DC

## 2016-02-07 NOTE — Patient Instructions (Signed)
Atrophic Vaginitis Atrophic vaginitis is when the tissues that line the vagina become dry and thin. This is caused by a drop in estrogen. Estrogen helps:  To keep the vagina moist.  To make a clear fluid that helps:  To lubricate the vagina for sex.  To protect the vagina from infection. If the lining of the vagina is dry and thin, it may:  Make sex painful. It may also cause bleeding.  Cause a feeling of:  Burning.  Irritation.  Itchiness.  Make an exam of your vagina painful. It may also cause bleeding.  Make you lose interest in sex.  Cause a burning feeling when you pee.  Make your vaginal fluid (discharge) brown or yellow. For some women, there are no symptoms. This condition is most common in women who do not get their regular menstrual periods anymore (menopause). This often starts when a woman is 5-44 years old. HOME CARE  Take medicines only as told by your doctor. Do not use any herbal or alternative medicines unless your doctor says it is okay.  Use over-the-counter products for dryness only as told by your doctor. These include:  Creams.  Lubricants.  Moisturizers.  Do not douche.  Do not use products that can make your vagina dry. These include:  Scented feminine sprays.  Scented tampons.  Scented soaps.  If it hurts to have sex, tell your sexual partner. GET HELP IF:  Your discharge looks different than normal.  Your vagina has an unusual smell.  You have new symptoms.  Your symptoms do not get better with treatment.  Your symptoms get worse.   This information is not intended to replace advice given to you by your health care provider. Make sure you discuss any questions you have with your health care provider.   Document Released: 12/23/2007 Document Revised: 11/20/2014 Document Reviewed: 06/27/2014 Elsevier Interactive Patient Education [employer].

## 2016-02-07 NOTE — Progress Notes (Signed)
Subjective:     Patient ID: Kristin Bass, female   DOB: 04/07/1961, 55 y.o.   MRN: [id number]  HPI  55 yo white g2 p0020  Here with complaint of vaginal dryness. Menopausal no HRT. History of dyspareunia with spouse due to ED and pump needed for erection. Has not been sexually active for a while and now would like to resume sexual activity. Patient had bought a penis shaped toy to help with penetration and can not insert due to dryness. Has been using Olive Oil but not working well now. Interested in vaginal estrogen for relief. Recent mammogram normal. No health other health issues today. No vaginal bleeding or pelvic pain. Last aex 05/31/15 all normal.   Review of Systems  Constitutional: Negative.   Genitourinary: Positive for vaginal pain and dyspareunia. Negative for vaginal bleeding, vaginal discharge and difficulty urinating.       Vaginal pain only with trial of insertion of penis and device  Skin: Negative.        Objective:   Physical Exam  Constitutional: She is oriented to person, place, and time. She appears well-developed and well-nourished.  Abdominal: Soft. There is no tenderness.  Genitourinary: Rectum normal, vagina normal and uterus normal. There is no rash, tenderness or lesion on the right labia. There is no rash, tenderness or lesion on the left labia. Cervix exhibits no motion tenderness and no friability. Right adnexum displays no mass, no tenderness and no fullness. Left adnexum displays no mass, no tenderness and no fullness. No erythema in the vagina. No vaginal discharge found.  Atrophic appearance, scant moisture, discomfort with speculum insertion , small one used. More lubricant inserted with discomfort improved. Patient inserted more lubricant herself after speculum removal and felt this was better.  Lymphadenopathy:       Right: No inguinal adenopathy present.       Left: No inguinal adenopathy present.  Neurological: She is alert and oriented to person, place,  and time.  Skin: Skin is warm and dry.  Psychiatric: She has a normal mood and affect.       Assessment:     Normal pelvic exam Atrophic vaginitis Dyspareunia chronic problem  Would like to resume sexual activity     Plan:     Discussed normal pelvic exam finding. Discussed atrophic vaginitis and effect on sexual activity and vaginal appearance of dryness. Discussed management with Estrogen as a trial to see if this will help or change moisturizer. Would recommend both. Risks/benefits of vaginal estrogen discussed. Questions addressed. Patient would like to try. Rx Vagifem with instructions  Start nightly use of coconut oil after use of vagifem, gradually introduce toy to help with stretching of vagina after 2 weeks of Vagifem and coconut oil use and small increments as she tolerates. Will advise if difficulty and come in. Work with spouse as needed to help her work with sexual activity again. Questions addressed.  Rv prn

## 2016-02-08 ENCOUNTER — Encounter: Payer: Self-pay | Admitting: Certified Nurse Midwife

## 2016-02-10 ENCOUNTER — Telehealth: Payer: Self-pay

## 2016-02-10 ENCOUNTER — Encounter: Payer: Self-pay | Admitting: Certified Nurse Midwife

## 2016-02-10 NOTE — Telephone Encounter (Signed)
Telephone encounter created to discuss mychart message with Melvia Heaps CNM.

## 2016-02-10 NOTE — Telephone Encounter (Signed)
Non-Urgent Medical Question  Message [id number]  From Kristin Bass To [name], CNM Sent 02/08/2016 6:38 AM  Do you know when my colonoscopy is due again?   Responsible Party   Pool - [facility] Clinical Pool No one has taken responsibility for this message.  No actions have been taken on this message.   Additional mychart message sent by patient. Per review of chart patient's last colonoscopy was performed in 2014 and was negative. [facility] of colon cancer in mother. Routing to [employer] CNM for review.

## 2016-02-10 NOTE — Telephone Encounter (Signed)
Non-Urgent Medical Question  Message [id number]  From Avantika Truglio To [name], CNM Sent 02/07/2016 7:26 PM  Hi Debi,   Thank you so much for that pamphlet. It was very informational. I wanted to ask about my Calcium. I am taking one 600 mg calcium plus Vit D (800 IU) daily. No Cal Mag. Do you want me to increase them to 2 of these a day. I am taking those chewables that have [id number] IU Folic Acid. I add milk to my coffee and eat a yogurt daily. After reading that pamphlet, I thought I better ask.   Also, concerned greatly with my weight. I am doing palettes but don't do 30 mins a day. I need to increase the cardio part. Can you suggest a healthy weight for me?   Looking forward to getting positive results from this coconut oil and few months of these pills. Couldn't believe how expensive they are.   Talk to you soon,   [place] No one has taken responsibility for this message.  No actions have been taken on this message.   Routing to [employer] CNM for review and advise.

## 2016-02-11 NOTE — Telephone Encounter (Signed)
Left message to call Colbie Danner at [phone]. 

## 2016-02-11 NOTE — Telephone Encounter (Signed)
I reviewed all the my chart notes. A healthy weight would be around 160 to 165 for her height. Eating healthy is also very important. If she is not getting any other calcium in diet other yogurt daily she needs to take calcium supplement BID. Better absorbed if she does dietary. Such as cheese, eggs , milk, bread with calcium fortified. Coconut oil can be taken out with teaspoon quickly rolled into ball and placed in vagina or can use a vaginal applicator, can purchase at drug store.  Colonoscopy with family history is usually every 5 years. Need to check with her GI

## 2016-02-12 NOTE — Progress Notes (Signed)
Encounter reviewed Kristin Tullius, MD   

## 2016-02-17 NOTE — Telephone Encounter (Signed)
Patient returned call and she is given message from [place].   She is agreeable to instructions as given and will work on supplementation of Calcium. She will call back with any further questions.   Routing to provider for final review. Patient agreeable to disposition. Will close encounter.

## 2016-02-17 NOTE — Telephone Encounter (Signed)
Call to patient. Patient states she is unable to talk at this time. She will return call.

## 2016-02-20 ENCOUNTER — Encounter: Payer: Self-pay | Admitting: Certified Nurse Midwife

## 2016-04-06 ENCOUNTER — Telehealth: Payer: Self-pay | Admitting: Certified Nurse Midwife

## 2016-04-06 NOTE — Telephone Encounter (Signed)
Patient called and left a message during lunch. She'd like to speak with one of our nurses. No additional details left.

## 2016-04-06 NOTE — Telephone Encounter (Signed)
Left message to call Jennica Tagliaferri at [phone]. 

## 2016-04-10 NOTE — Telephone Encounter (Signed)
Spoke with patient. Patient requests that her labs from her last aex be sent to her PCP Antony Contras, MD at [place] [facility].  Labs from 05/31/2015 TSH, Vitamin D, hemoglobin, and urinalysis dipstick results faxed to Dr. Moreen Fowler at [phone] with cover sheet and confirmation.  Routing to provider for final review. Patient agreeable to disposition. Will close encounter.

## 2016-05-28 ENCOUNTER — Emergency Department (HOSPITAL_COMMUNITY): Payer: BC Managed Care – PPO

## 2016-05-28 ENCOUNTER — Encounter (HOSPITAL_COMMUNITY): Payer: Self-pay

## 2016-05-28 ENCOUNTER — Emergency Department (HOSPITAL_COMMUNITY)
Admission: EM | Admit: 2016-05-28 | Discharge: 2016-05-28 | Disposition: A | Discharge status: Home / Self Care | Payer: BC Managed Care – PPO | Attending: Emergency Medicine | Admitting: Emergency Medicine

## 2016-05-28 DIAGNOSIS — R05 Cough: Secondary | ICD-10-CM | POA: Insufficient documentation

## 2016-05-28 DIAGNOSIS — R058 Other specified cough: Secondary | ICD-10-CM

## 2016-05-28 DIAGNOSIS — J029 Acute pharyngitis, unspecified: Secondary | ICD-10-CM

## 2016-05-28 DIAGNOSIS — Z87891 Personal history of nicotine dependence: Secondary | ICD-10-CM | POA: Diagnosis not present

## 2016-05-28 DIAGNOSIS — R1011 Right upper quadrant pain: Secondary | ICD-10-CM | POA: Diagnosis not present

## 2016-05-28 LAB — COMPREHENSIVE METABOLIC PANEL
ALT: 16 U/L (ref 14–54)
AST: 16 U/L (ref 15–41)
Albumin: 3.6 g/dL (ref 3.5–5.0)
Alkaline Phosphatase: 55 U/L (ref 38–126)
Anion gap: 7 (ref 5–15)
BUN: 16 mg/dL (ref 6–20)
CO2: 27 mmol/L (ref 22–32)
Calcium: 9.2 mg/dL (ref 8.9–10.3)
Chloride: 105 mmol/L (ref 101–111)
Creatinine, Ser: 0.74 mg/dL (ref 0.44–1.00)
GFR calc Af Amer: >60 mL/min (ref >60)
GFR calc non Af Amer: >60 mL/min (ref >60)
Glucose, Bld: 123 mg/dL — ABNORMAL HIGH (ref 65–99)
Potassium: 4 mmol/L (ref 3.5–5.1)
Sodium: 139 mmol/L (ref 135–145)
Total Bilirubin: 0.5 mg/dL (ref 0.3–1.2)
Total Protein: 6.5 g/dL (ref 6.5–8.1)

## 2016-05-28 LAB — CBC WITH DIFFERENTIAL/PLATELET
Basophils Absolute: 0 10*3/uL (ref 0.0–0.1)
Basophils Relative: 0 %
Eosinophils Absolute: 0.2 10*3/uL (ref 0.0–0.7)
Eosinophils Relative: 2 %
HCT: 39.6 % (ref 36.0–46.0)
Hemoglobin: 12.9 g/dL (ref 12.0–15.0)
Lymphocytes Relative: 17 %
Lymphs Abs: 1.5 10*3/uL (ref 0.7–4.0)
MCH: 30.6 pg (ref 26.0–34.0)
MCHC: 32.6 g/dL (ref 30.0–36.0)
MCV: 94.1 fL (ref 78.0–100.0)
Monocytes Absolute: 0.9 10*3/uL (ref 0.1–1.0)
Monocytes Relative: 10 %
Neutro Abs: 6.2 10*3/uL (ref 1.7–7.7)
Neutrophils Relative %: 71 %
Platelets: 175 10*3/uL (ref 150–400)
RBC: 4.21 MIL/uL (ref 3.87–5.11)
RDW: 12.6 % (ref 11.5–15.5)
WBC: 8.8 10*3/uL (ref 4.0–10.5)

## 2016-05-28 LAB — RAPID STREP SCREEN (MED CTR MEBANE ONLY): Streptococcus, Group A Screen (Direct): NEGATIVE

## 2016-05-28 LAB — MONONUCLEOSIS SCREEN: Mono Screen: NEGATIVE

## 2016-05-28 MED ORDER — DEXAMETHASONE 4 MG PO TABS
12.0000 mg | ORAL_TABLET | Freq: Once | ORAL | Status: AC
  Administered 2016-05-28: 12 mg via ORAL
  Filled 2016-05-28: qty 3

## 2016-05-28 NOTE — ED Notes (Signed)
Patient transported to X-ray 

## 2016-05-28 NOTE — ED Triage Notes (Signed)
Pt woke up tonight feeling sharp RUQ abdominal pain and sore throat. Pt states abdominal pain has resolved and and she now has persistent productive cough and sore throat. States having fever on Monday. Denies any fevers.

## 2016-05-28 NOTE — ED Provider Notes (Signed)
[facility] DEPT Provider Note   CSN: [id number] Arrival date & time: 05/28/16  0247  By signing my name below, I, Kristin Bass, attest that this documentation has been prepared under the direction and in the presence of Kristin Fuel, MD  Electronically Signed: Delton Bass, ED Scribe. 05/28/16. 3:19 AM.  History   Chief Complaint Chief Complaint  Patient presents with  . Sore Throat   The history is provided by the patient. No language interpreter was used.   HPI Comments:  Kristin Bass is a 55 y.o. female who presents to the Emergency Department complaining of gradually improving sore throat x 3 days. Pt notes associated fevers (Tmax 101.2). She also notes associated cough with phlegm and resolved sharp LUQ abdominal pain which lasted for a few seconds. 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 She has used cough drops with little relief. Pt denies nausea, current fevers, diarrhea, constipation and trouble breathing. Pt is a non-smoker. She is allergic to ciprofloxacin, macrobid and Septra.   Past Medical History:  Diagnosis Date  . Anxiety   . History of degenerative disc disease    mild  . Infertility, female   . Mild scoliosis   . Myofascial pain syndrome   . Wrist fracture     Patient Active Problem List   Diagnosis Date Noted  . 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  . Radiculopathy of lumbosacral region 01/06/2015    Past Surgical History:  Procedure Laterality Date  . BREAST CYST EXCISION Left   . CYSTECTOMY     from neck  . DILATION AND CURETTAGE OF UTERUS     times 2  . FOOT SURGERY Left 11/11   hammer toe & plantar fascitis  . VEIN LIGATION AND STRIPPING      OB History    Gravida Para Term Preterm AB Living   2       2 0   SAB TAB Ectopic Multiple Live Births       2          Obstetric Comments   1 adopted children       Home Medications    Prior to Admission medications   Medication  Sig Start Date End Date Taking? Authorizing Provider  acetaminophen (TYLENOL) 500 MG tablet Take 500 mg by mouth every 8 (eight) hours as needed for mild pain.    Historical Provider, MD  Calcium Carbonate-Vitamin D (CALCIUM + D PO) Take 1 tablet by mouth daily.    Historical Provider, MD  Docusate Calcium (STOOL SOFTENER PO) Apply 1 capsule topically at bedtime. Take every night per patient    Historical Provider, MD  Estradiol 10 MCG TABS vaginal tablet Insert 1 tablet vaginally q hs x 2 weeks and then decrease to twice weekly 02/07/16   [name], CNM  fluticasone [facility]) 50 MCG/ACT nasal spray Place 1 spray into both nostrils daily as needed for allergies or rhinitis.    Historical Provider, MD  HORSE CHESTNUT PO Take by mouth 2 (two) times daily.    Historical Provider, MD  MAGNESIUM PO Take 1 tablet by mouth daily.    Historical Provider, MD  Multiple Vitamins-Minerals (MULTIVITAMIN PO) Take by mouth daily.    Historical Provider, MD  Omega-3 Fatty Acids (FISH OIL PO) Take 1 capsule by mouth every evening.     Historical Provider, MD  TURMERIC PO Take by mouth daily.    Historical Provider, MD  Venlafaxine HCl (  EFFEXOR PO) Take by mouth daily.    Historical Provider, MD    Family History Family History  Problem Relation Age of Onset  . Colon cancer Mother   . Hypertension Father   . Anxiety disorder Father   . Diabetes Maternal Grandfather   . Hypertension Maternal Grandmother   . COPD Maternal Aunt   . Stroke Maternal Aunt   . Lung cancer Maternal Aunt     Social History Social History  Substance Use Topics  . Smoking status: Former Research scientist (life sciences)  . Smokeless tobacco: Not on file  . Alcohol use No     Allergies   Ciprofloxacin; Macrobid [nitrofurantoin]; and Septra [sulfamethoxazole-trimethoprim]   Review of Systems Review of Systems  Constitutional: Positive for fever.  HENT: Positive for sore throat.   Respiratory: Positive for cough.   Gastrointestinal:  Positive for abdominal pain. Negative for constipation, diarrhea and nausea.  All other systems reviewed and are negative.    Physical Exam Updated Vital Signs LMP 01/18/2012   Physical Exam  Constitutional: She is oriented to person, place, and time. She appears well-developed and well-nourished.  HENT:  Head: Normocephalic and atraumatic.  Mild oropharyngeal erythema   Eyes: EOM are normal. Pupils are equal, round, and reactive to light.  Neck: Normal range of motion. Neck supple. No JVD present.  Cardiovascular: Normal rate, regular rhythm and normal heart sounds.   No murmur heard. Pulmonary/Chest: Effort normal and breath sounds normal. She has no wheezes. She has no rales. She exhibits no tenderness.  Abdominal: Soft. Bowel sounds are normal. She exhibits no distension and no mass. There is no tenderness.  Musculoskeletal: Normal range of motion. She exhibits no edema.  Lymphadenopathy:    She has no cervical adenopathy.  Neurological: She is alert and oriented to person, place, and time. No cranial nerve deficit. She exhibits normal muscle tone. Coordination normal.  Skin: Skin is warm and dry. No rash noted.  Psychiatric: She has a normal mood and affect. Her behavior is normal. Judgment and thought content normal.  Nursing note and vitals reviewed.    ED Treatments / Results  DIAGNOSTIC STUDIES:  Oxygen Saturation is 98% on RA, normal by my interpretation.    COORDINATION OF CARE:  3:01 AM Discussed treatment plan with pt at bedside and pt agreed to plan.  Labs (all labs ordered are listed, but only abnormal results are displayed) Labs Reviewed  COMPREHENSIVE METABOLIC PANEL - Abnormal; Notable for the following:       Result Value   Glucose, Bld 123 (*)    All other components within normal limits  RAPID STREP SCREEN (NOT AT [facility])  CULTURE, GROUP A STREP ([place])  CBC WITH DIFFERENTIAL/PLATELET  MONONUCLEOSIS SCREEN     Radiology Dg Chest 2 View  Result  Date: 05/28/2016 CLINICAL DATA:  Cough, fever, and sore throat for 4 days. EXAM: CHEST  2 VIEW COMPARISON:  None. FINDINGS: The heart size and mediastinal contours are within normal limits. Both lungs are clear. The visualized skeletal structures are unremarkable. IMPRESSION: No active cardiopulmonary disease. Electronically Signed   By: Lucienne Capers M.D.   On: 05/28/2016 03:32    Procedures Procedures (including critical care time)  Medications Ordered in ED Medications  dexamethasone (DECADRON) tablet 12 mg (12 mg Oral Given 05/28/16 0411)     Initial Impression / Assessment and Plan / ED Course  I have reviewed the triage vital signs and the nursing notes.  Pertinent labs & imaging results that were available  during my care of the patient were reviewed by me and considered in my medical decision making (see chart for details).  Clinical Course    Pharyngitis with cough which appears to be a viral respiratory tract infection. Minimal findings on exam. Episode of transient right upper quadrant pain of uncertain cause. Strep screen is obtained and is negative. Screening labs including CBC and comp grams of metabolic panel are normal. Mono screen is negative. Chest x-ray shows no evidence of pneumonia. She did have a second episode of pain which is also even absent. Old records are reviewed, and she has no relevant past visits. Patient is reassured about the results of the workup. Exam is repeated, and she continues to be nontender even to deep palpation in the right upper quadrant and the right rib cage. She is given a dose of dexamethasone in the ED and is advised using over-the-counter analgesics as needed for pain. Return should symptoms worsen.   Final Clinical Impressions(s) / ED Diagnoses   Final diagnoses:  Pharyngitis, unspecified etiology  RUQ pain  Cough with sputum    New Prescriptions New Prescriptions   No medications on file  I personally performed the services  described in this documentation, which was scribed in my presence. The recorded information has been reviewed and is accurate.       Kristin Fuel, MD [id number]

## 2016-05-28 NOTE — Discharge Instructions (Signed)
Take ibuprofen, naproxen, or acetaminophen as needed. Return if your pain is getting worse.

## 2016-05-30 LAB — CULTURE, GROUP A STREP (THRC)

## 2016-06-10 DIAGNOSIS — H9122 Sudden idiopathic hearing loss, left ear: Secondary | ICD-10-CM | POA: Insufficient documentation

## 2016-06-15 DIAGNOSIS — H9312 Tinnitus, left ear: Secondary | ICD-10-CM | POA: Insufficient documentation

## 2016-08-18 ENCOUNTER — Telehealth: Payer: Self-pay | Admitting: Certified Nurse Midwife

## 2016-08-18 NOTE — Telephone Encounter (Signed)
Patient is asking to talk with someone about the diagnosis code that was used for her visit last year. Patient is asking for the diagnosis code to be changed.  Patient said "Evalee Mutton will know what I am talking about".

## 2016-08-19 ENCOUNTER — Encounter: Payer: Self-pay | Admitting: Certified Nurse Midwife

## 2016-08-23 DIAGNOSIS — R0683 Snoring: Secondary | ICD-10-CM | POA: Insufficient documentation

## 2016-08-23 DIAGNOSIS — M26623 Arthralgia of bilateral temporomandibular joint: Secondary | ICD-10-CM | POA: Insufficient documentation

## 2016-08-23 DIAGNOSIS — L720 Epidermal cyst: Secondary | ICD-10-CM | POA: Insufficient documentation

## 2016-08-23 DIAGNOSIS — L723 Sebaceous cyst: Secondary | ICD-10-CM | POA: Insufficient documentation

## 2016-08-26 NOTE — Telephone Encounter (Signed)
See telephone encounter dated 08/18/16, routed to office administrator for review and follow-up with patient. Will close encounter.

## 2016-09-15 ENCOUNTER — Encounter: Payer: Self-pay | Admitting: Certified Nurse Midwife

## 2016-09-15 ENCOUNTER — Other Ambulatory Visit (HOSPITAL_BASED_OUTPATIENT_CLINIC_OR_DEPARTMENT_OTHER): Payer: Self-pay

## 2016-09-15 DIAGNOSIS — R0683 Snoring: Secondary | ICD-10-CM

## 2016-09-16 NOTE — Telephone Encounter (Signed)
To office administrator for review.

## 2016-10-05 ENCOUNTER — Ambulatory Visit (HOSPITAL_BASED_OUTPATIENT_CLINIC_OR_DEPARTMENT_OTHER): Payer: BC Managed Care – PPO | Attending: Otolaryngology | Admitting: Internal Medicine

## 2016-10-05 DIAGNOSIS — R0683 Snoring: Secondary | ICD-10-CM | POA: Insufficient documentation

## 2016-10-05 DIAGNOSIS — G4733 Obstructive sleep apnea (adult) (pediatric): Secondary | ICD-10-CM | POA: Diagnosis not present

## 2016-10-05 DIAGNOSIS — G4736 Sleep related hypoventilation in conditions classified elsewhere: Secondary | ICD-10-CM | POA: Insufficient documentation

## 2016-10-07 ENCOUNTER — Other Ambulatory Visit (HOSPITAL_BASED_OUTPATIENT_CLINIC_OR_DEPARTMENT_OTHER): Payer: Self-pay

## 2016-10-07 DIAGNOSIS — R0683 Snoring: Secondary | ICD-10-CM

## 2016-10-10 DIAGNOSIS — G4733 Obstructive sleep apnea (adult) (pediatric): Secondary | ICD-10-CM

## 2016-10-10 NOTE — Procedures (Signed)
  Patient Name: Kristin Bass, Kristin Bass Date: 10/05/2016 Gender: Female D.O.B: 12/13/1960 Age (years): 55 Referring Provider: Jodi Marble Height (inches): 40 Interpreting Physician: Baird Lyons MD, ABSM Weight (lbs): 180 RPSGT: Jacolyn Reedy BMI: 27 MRN: [id number] Neck Size: 14.00 CLINICAL INFORMATION Sleep Study Type:   unattended HST     Indication for sleep study: Snoring     Epworth Sleepiness Score: 7  SLEEP STUDY TECHNIQUE A multi-channel overnight portable sleep study was performed. The channels recorded were: nasal airflow, thoracic respiratory movement, and oxygen saturation with a pulse oximetry. Snoring was also monitored.  MEDICATIONS Patient self administered medications include: none reported.  SLEEP ARCHITECTURE Patient was studied for 415.0 minutes. The sleep efficiency was 98.7 % and the patient was supine for 37%. The arousal index was 0.0 per hour.  RESPIRATORY PARAMETERS The overall AHI was 7.4 per hour, with a central apnea index of 0.0 per hour.  The oxygen nadir was 67% during sleep.  CARDIAC DATA Mean heart rate during sleep was 65.3 bpm.  IMPRESSIONS - Mild obstructive sleep apnea occurred during this study (AHI = 7.4/h). - No significant central sleep apnea occurred during this study (CAI = 0.0/h). - Severe oxygen desaturation was noted during this study (Min O2 = 67%). - Patient snored   DIAGNOSIS - Obstructive Sleep Apnea (327.23 [G47.33 ICD-10]) - Nocturnal Hypoxemia (327.26 [G47.36 ICD-10])  RECOMMENDATIONS - If conservative measures are insufficient, then consider CPAP or a fitted oral appliance - Avoid alcohol, sedatives and other CNS depressants that may worsen sleep apnea and disrupt normal sleep architecture. - Sleep hygiene should be reviewed to assess factors that may improve sleep quality. - Weight management and regular exercise should be initiated or continued.  [Electronically signed] 10/10/2016 11:20  AM  Baird Lyons MD, ABSM Diplomate, American Board of Sleep Medicine   NPI: [id number] [place], American Board of Sleep Medicine  ELECTRONICALLY SIGNED ON:  10/10/2016, 11:14 AM [place] PH: [phone]   FX: [phone] [place]

## 2016-12-07 DIAGNOSIS — G4733 Obstructive sleep apnea (adult) (pediatric): Secondary | ICD-10-CM | POA: Insufficient documentation

## 2016-12-30 ENCOUNTER — Encounter (HOSPITAL_COMMUNITY): Payer: Self-pay | Admitting: Emergency Medicine

## 2016-12-30 ENCOUNTER — Ambulatory Visit (INDEPENDENT_AMBULATORY_CARE_PROVIDER_SITE_OTHER): Payer: Worker's Compensation

## 2016-12-30 ENCOUNTER — Ambulatory Visit (HOSPITAL_COMMUNITY)
Admission: EM | Admit: 2016-12-30 | Discharge: 2016-12-30 | Disposition: A | Discharge status: Home / Self Care | Payer: Worker's Compensation | Attending: Family Medicine | Admitting: Family Medicine

## 2016-12-30 DIAGNOSIS — T148XXA Other injury of unspecified body region, initial encounter: Secondary | ICD-10-CM | POA: Diagnosis not present

## 2016-12-30 DIAGNOSIS — W19XXXA Unspecified fall, initial encounter: Secondary | ICD-10-CM

## 2016-12-30 DIAGNOSIS — S63501A Unspecified sprain of right wrist, initial encounter: Secondary | ICD-10-CM

## 2016-12-30 DIAGNOSIS — S66911A Strain of unspecified muscle, fascia and tendon at wrist and hand level, right hand, initial encounter: Secondary | ICD-10-CM

## 2016-12-30 MED ORDER — IBUPROFEN 800 MG PO TABS
800.0000 mg | ORAL_TABLET | Freq: Once | ORAL | Status: AC
  Administered 2016-12-30: 800 mg via ORAL

## 2016-12-30 MED ORDER — IBUPROFEN 800 MG PO TABS
ORAL_TABLET | ORAL | Status: AC
  Filled 2016-12-30: qty 1

## 2016-12-30 MED ORDER — NAPROXEN 500 MG PO TABS: 500.0000 mg | ORAL_TABLET | Freq: Two times a day (BID) | ORAL | 0 refills | Status: DC

## 2016-12-30 MED ORDER — IBUPROFEN 800 MG PO TABS: 800.0000 mg | ORAL_TABLET | Freq: Three times a day (TID) | ORAL | 0 refills | Status: DC

## 2016-12-30 MED ORDER — IBUPROFEN 800 MG PO TABS: 800.0000 mg | ORAL_TABLET | Freq: Three times a day (TID) | ORAL | 0 refills | Status: AC

## 2016-12-30 NOTE — ED Triage Notes (Signed)
The patient presented to the [facility] with a complaint of right hand pain and bilateral knee abrasions secondary to a fall at work today.

## 2016-12-30 NOTE — ED Provider Notes (Signed)
CSN: [id number]     Arrival date & time 12/30/16  1659 History     Chief Complaint  Patient presents with  . Fall    Patient is a 56 yo female who fell at work at [employer] this afternoon presenting with right wrist pain and bilateral lower extremity abrasions. She state she tripped over the curb and fell on right hand. Denies head injury, loss of consciousness. Denies dizziness, lightheadedness.  Pain in right wrist constant with exacerbation upon movement. Patient states she notified employer after the event and came here for treatment. Has not tried anything to relieve pain. She has noticed some swelling. Denies numbness/tingling, redness, increase warmth.       Past Medical History:  Diagnosis Date  . Anxiety   . History of degenerative disc disease    mild  . Infertility, female   . Mild scoliosis   . Myofascial pain syndrome   . Wrist fracture    Past Surgical History:  Procedure Laterality Date  . BREAST CYST EXCISION Left   . CYSTECTOMY     from neck  . DILATION AND CURETTAGE OF UTERUS     times 2  . FOOT SURGERY Left 11/11   hammer toe & plantar fascitis  . VEIN LIGATION AND STRIPPING     Family History  Problem Relation Age of Onset  . Colon cancer Mother   . Hypertension Father   . Anxiety disorder Father   . Diabetes Maternal Grandfather   . Hypertension Maternal Grandmother   . COPD Maternal Aunt   . Stroke Maternal Aunt   . Lung cancer Maternal Aunt    Social History  Substance Use Topics  . Smoking status: Former Research scientist (life sciences)  . Smokeless tobacco: Never Used  . Alcohol use No   OB History    Gravida Para Term Preterm AB Living   2       2 0   SAB TAB Ectopic Multiple Live Births       2          Obstetric Comments   1 adopted children     Review of Systems  Constitutional: Negative for fatigue and fever.  Respiratory: Negative for chest tightness, shortness of breath and wheezing.   Cardiovascular: Negative for chest pain and  palpitations.  Musculoskeletal: Positive for arthralgias, joint swelling and myalgias.  Skin: Positive for wound. Negative for rash.  Neurological: Negative for dizziness, weakness, light-headedness and numbness.    Allergies  Ciprofloxacin; Macrobid [nitrofurantoin]; and Septra [sulfamethoxazole-trimethoprim]  Home Medications   Prior to Admission medications   Medication Sig Start Date End Date Taking? Authorizing Provider  acetaminophen (TYLENOL) 500 MG tablet Take 500 mg by mouth every 8 (eight) hours as needed for mild pain.    [provider]  Calcium Carbonate-Vitamin D (CALCIUM + D PO) Take 1 tablet by mouth daily.    [provider]  Docusate Calcium (STOOL SOFTENER PO) Apply 1 capsule topically at bedtime. Take every night per patient    [provider]  Estradiol 10 MCG TABS vaginal tablet Insert 1 tablet vaginally q hs x 2 weeks and then decrease to twice weekly 02/07/16   [name], CNM  fluticasone [facility]) 50 MCG/ACT nasal spray Place 1 spray into both nostrils daily as needed for allergies or rhinitis.    [provider]  HORSE CHESTNUT PO Take by mouth 2 (two) times daily.    [provider]  ibuprofen (ADVIL,MOTRIN) 800 MG  tablet Take 1 tablet (800 mg total) by mouth 3 (three) times daily. 12/30/16 01/09/17  Ok Edwards, PA-C  MAGNESIUM PO Take 1 tablet by mouth daily.    [provider]  Multiple Vitamins-Minerals (MULTIVITAMIN PO) Take by mouth daily.    [provider]  Omega-3 Fatty Acids (FISH OIL PO) Take 1 capsule by mouth every evening.     [provider]  TURMERIC PO Take by mouth daily.    [provider]  Venlafaxine HCl (EFFEXOR PO) Take by mouth daily.    [provider]   Meds Ordered and Administered this Visit   Medications  ibuprofen (ADVIL,MOTRIN) tablet 800 mg (800 mg Oral Given 12/30/16 1910)    BP 109/81 (BP Location: Left Arm)   Pulse (!) 103   Temp  98.2 F (36.8 C) (Oral)   Resp 18   LMP 01/18/2012   SpO2 95%  No data found.   Physical Exam  Constitutional: She is oriented to person, place, and time. She appears well-developed and well-nourished.  In mild distress due to pain of wrist  HENT:  Head: Normocephalic and atraumatic.  Eyes: Conjunctivae are normal. Pupils are equal, round, and reactive to light.  Neck: Neck supple.  Cardiovascular: Normal rate and regular rhythm.  Exam reveals no gallop and no friction rub.   No murmur heard. Pulmonary/Chest: Effort normal and breath sounds normal.  Musculoskeletal:       Right wrist: She exhibits decreased range of motion, tenderness and swelling.       Left wrist: Normal.       Right knee: Normal.  Left knee with abrasion. Full ROM, strength. No tenderness on palpation of joint space.   Neurological: She is alert and oriented to person, place, and time. She has normal strength.  Skin: Skin is warm and dry. Abrasion noted.     Psychiatric: Her behavior is normal. Judgment and thought content normal.  Mildly anxious due to injury.     Urgent Care Course     Procedures (including critical care time)  Labs Review Labs Reviewed - No data to display  Imaging Review Dg Hand Complete Right  Result Date: 12/30/2016 CLINICAL DATA:  Pain and swelling secondary to fall at work today. Attention base of thumb and thenar eminence. EXAM: RIGHT HAND - COMPLETE 3+ VIEW COMPARISON:  None. FINDINGS: There is no evidence of acute fracture or dislocation. Scaphoid appears intact. Carpal rows are maintained. Top-normal widened appearance of the scapholunate interval at 3 mm. A metallic ring obscures the mid shaft of the right fourth proximal phalanx. Soft tissues are unremarkable. IMPRESSION: No acute osseous abnormality of the right hand and wrist. Borderline widening of the scapholunate interval is noted. If the patient has persistent wrist pain, evaluation to exclude a scapholunate ligament  tear with MR arthrography may prove useful. Electronically Signed   By: Ashley Royalty M.D.   On: 12/30/2016 17:58         MDM   1. Right wrist sprain, initial encounter   2. Muscle strain of right wrist, initial encounter   3. Fall, initial encounter   4. Abrasion    1. Discussed imaging results with patient. Discussed possibility of evaluation to exclude scapholunate ligament tear if wrist pain persistent per report.  2. Start Ibuprofen 800mg  TID x 10 days. Discussed possibility of stomach upset with medication, and to take food with medication.  3. Start wrist brace. Discussed at length on resting wrist and to avoid  movement that can exacerbate pain. Discussed restrictions, patient states that it is her last day of work today, and will be able to adequately rest her wrist.  4. Patient to follow up with [facility] [employer] health and wellness if needed.  5. Regular wound care for abrasions of the lower extremities.    Ok Edwards, PA-C 12/30/16 1944

## 2017-01-13 ENCOUNTER — Ambulatory Visit: Payer: Self-pay

## 2017-01-13 ENCOUNTER — Other Ambulatory Visit: Payer: Self-pay | Admitting: Occupational Medicine

## 2017-01-13 DIAGNOSIS — M79641 Pain in right hand: Secondary | ICD-10-CM

## 2017-01-21 ENCOUNTER — Encounter: Payer: Self-pay | Admitting: Certified Nurse Midwife

## 2017-01-21 ENCOUNTER — Ambulatory Visit (INDEPENDENT_AMBULATORY_CARE_PROVIDER_SITE_OTHER): Payer: BC Managed Care – PPO | Admitting: Certified Nurse Midwife

## 2017-01-21 ENCOUNTER — Other Ambulatory Visit (HOSPITAL_COMMUNITY)
Admission: RE | Admit: 2017-01-21 | Discharge: 2017-01-21 | Disposition: A | Discharge status: Home / Self Care | Payer: BC Managed Care – PPO | Source: Ambulatory Visit | Attending: Certified Nurse Midwife | Admitting: Certified Nurse Midwife

## 2017-01-21 VITALS — BP 110/70 | HR 70 | Resp 16 | Ht 68.25 in | Wt 177.0 lb

## 2017-01-21 DIAGNOSIS — Z8659 Personal history of other mental and behavioral disorders: Secondary | ICD-10-CM | POA: Diagnosis not present

## 2017-01-21 DIAGNOSIS — Z124 Encounter for screening for malignant neoplasm of cervix: Secondary | ICD-10-CM | POA: Insufficient documentation

## 2017-01-21 DIAGNOSIS — Z01419 Encounter for gynecological examination (general) (routine) without abnormal findings: Secondary | ICD-10-CM | POA: Diagnosis not present

## 2017-01-21 NOTE — Progress Notes (Signed)
56 y.o. G48P0020 Married  Caucasian Fe here for annual exam. Menopausal no HRT. Denies vaginal bleeding, or vaginal dryness. Now on C-Pap now for sleeping, which has helped. Planning to start college again. Still having vaginal dryness, but not sexual active now, which is much better. Planning to go back to college soon, for a change in job. Sees PCP for aex and labs and medication management for anxiety as needed. No other health issues today. Mother coming soon to visit.  Patient's last menstrual period was 01/18/2012.          Sexually active: No.  The current method of family planning is vasectomy.    Exercising: Yes.    powerwalking Smoker:  no  Health Maintenance: Pap:  04-04-14 neg History of Abnormal Pap: no MMG:  02-06-16 bilateral & left breast category a density birads 2:neg Self Breast exams: no Colonoscopy:  2014 neg BMD:   ? TDaP:  2014 Shingles: no Pneumonia: no Hep C and HIV: both neg 2016 Labs: none   reports that she has quit smoking. She has never used smokeless tobacco. She reports that she does not drink alcohol or use drugs.  Past Medical History:  Diagnosis Date  . Anxiety   . History of degenerative disc disease    mild  . Infertility, female   . Mild scoliosis   . Myofascial pain syndrome   . Wrist fracture     Past Surgical History:  Procedure Laterality Date  . BREAST CYST EXCISION Left   . CYSTECTOMY     from neck  . DILATION AND CURETTAGE OF UTERUS     times 2  . FOOT SURGERY Left 11/11   hammer toe & plantar fascitis  . VEIN LIGATION AND STRIPPING      Current Outpatient Prescriptions  Medication Sig Dispense Refill  . acetaminophen (TYLENOL) 500 MG tablet Take 500 mg by mouth every 8 (eight) hours as needed for mild pain.    . Calcium Carbonate-Vitamin D (CALCIUM + D PO) Take 1 tablet by mouth daily.    Mariane Baumgarten Calcium (STOOL SOFTENER PO) Apply 1 capsule topically at bedtime. Take every night per patient    . GARLIC PO Take by mouth.     Marland Kitchen HORSE CHESTNUT PO Take by mouth 2 (two) times daily.    Marland Kitchen MAGNESIUM PO Take 1 tablet by mouth daily.    . Multiple Vitamins-Minerals (MULTIVITAMIN PO) Take by mouth daily.    . Omega-3 Fatty Acids (FISH OIL PO) Take 1 capsule by mouth every evening.     . TURMERIC PO Take by mouth daily.    Marland Kitchen venlafaxine XR (EFFEXOR-XR) 150 MG 24 hr capsule   0   No current facility-administered medications for this visit.     Family History  Problem Relation Age of Onset  . Colon cancer Mother   . Hypertension Father   . Anxiety disorder Father   . Diabetes Maternal Grandfather   . Hypertension Maternal Grandmother   . COPD Maternal Aunt   . Stroke Maternal Aunt   . Lung cancer Maternal Aunt     ROS:  Pertinent items are noted in HPI.  Otherwise, a comprehensive ROS was negative.  Exam:   BP 110/70   Pulse 70   Resp 16   Ht 5' 8.25" (1.734 m)   Wt 177 lb (80.3 kg)   LMP 01/18/2012   BMI 26.72 kg/m  Height: 5' 8.25" (173.4 cm) Ht Readings from Last 3 Encounters:  01/21/17  5' 8.25" (1.734 m)  10/05/16 5\' 8"  (1.727 m)  02/07/16 5' 8.25" (1.734 m)    General appearance: alert, cooperative and appears stated age Head: Normocephalic, without obvious abnormality, atraumatic Neck: no adenopathy, supple, symmetrical, trachea midline and thyroid normal to inspection and palpation Lungs: clear to auscultation bilaterally Breasts: normal appearance, no masses or tenderness, No nipple retraction or dimpling, No nipple discharge or bleeding, No axillary or supraclavicular adenopathy Heart: regular rate and rhythm Abdomen: soft, non-tender; no masses,  no organomegaly Extremities: extremities normal, atraumatic, no cyanosis or edema Skin: Skin color, texture, turgor normal. No rashes or lesions Lymph nodes: Cervical, supraclavicular, and axillary nodes normal. No abnormal inguinal nodes palpated Neurologic: Grossly normal   Pelvic: External genitalia:  no lesions              Urethra:   normal appearing urethra with no masses, tenderness or lesions              Bartholin's and Skene's: normal                 Vagina: normal appearing vagina with normal color and discharge, no lesions              Cervix: no cervical motion tenderness, no lesions and nulliparous appearance              Pap taken: Yes.   Bimanual Exam:  Uterus:  normal size, contour, position, consistency, mobility, non-tender and anteverted              Adnexa: normal adnexa and no mass, fullness, tenderness               Rectovaginal: Confirms               Anus:  normal sphincter tone, no lesions  Chaperone present: yes  A:  Well Woman with normal exam  Menopausal no HRT  Anxiety with MD management with stable medication  Vaginal dryness improved with Coconut oil use and no sexual activity now, mutual agreement with spouse  P:   Reviewed health and wellness pertinent to exam  Aware to advise if vaginal bleeding  Continue with MD as indicated  Pap smear: yes   counseled on breast self exam, mammography screening, feminine hygiene, adequate intake of calcium and vitamin D, diet and exercise  return annually or prn  An After Visit Summary was printed and given to the patient.

## 2017-01-21 NOTE — Patient Instructions (Signed)

## 2017-01-25 LAB — CYTOLOGY - PAP
Diagnosis: NEGATIVE
HPV: NOT DETECTED

## 2017-04-07 ENCOUNTER — Encounter: Payer: Self-pay | Admitting: Certified Nurse Midwife

## 2017-09-09 DIAGNOSIS — M545 Low back pain, unspecified: Secondary | ICD-10-CM | POA: Insufficient documentation

## 2017-09-09 DIAGNOSIS — M549 Dorsalgia, unspecified: Secondary | ICD-10-CM | POA: Insufficient documentation

## 2018-02-25 ENCOUNTER — Ambulatory Visit: Payer: BC Managed Care – PPO | Admitting: Certified Nurse Midwife

## 2018-03-01 ENCOUNTER — Other Ambulatory Visit: Payer: Self-pay

## 2018-03-01 ENCOUNTER — Ambulatory Visit (INDEPENDENT_AMBULATORY_CARE_PROVIDER_SITE_OTHER): Payer: BC Managed Care – PPO | Admitting: Certified Nurse Midwife

## 2018-03-01 ENCOUNTER — Encounter: Payer: Self-pay | Admitting: Certified Nurse Midwife

## 2018-03-01 VITALS — BP 110/80 | HR 72 | Resp 16 | Ht 68.25 in | Wt 183.0 lb

## 2018-03-01 DIAGNOSIS — Z01419 Encounter for gynecological examination (general) (routine) without abnormal findings: Secondary | ICD-10-CM

## 2018-03-01 DIAGNOSIS — N951 Menopausal and female climacteric states: Secondary | ICD-10-CM

## 2018-03-01 DIAGNOSIS — N952 Postmenopausal atrophic vaginitis: Secondary | ICD-10-CM

## 2018-03-01 DIAGNOSIS — Z8659 Personal history of other mental and behavioral disorders: Secondary | ICD-10-CM

## 2018-03-01 NOTE — Progress Notes (Signed)
57 y.o. G42P0020 Married  Caucasian Fe here for annual exam. Denies vaginal dryness, no vaginal bleeding. Recent bone graph on left gum healing now.. Implants are next for dental health. Precancerous area on upper right lip noted by patient's mother. Was seen by dermatologist and given medication to treat. Recent visit with Dr. Moreen Fowler for aex, medication management for depression/anxiety (Effexor),labs. All normal per patient. Spouse and patient have worked through concerns with no sexual activity and being together in other ways. Patient very comfortable in this status now. No other health concerns today.  Patient's last menstrual period was 01/18/2012.          Sexually active: No.  The current method of family planning is vasectomy.    Exercising: Yes.    cardio, power walk, pilates but none since oral surgery Smoker:  no  Review of Systems  Constitutional:       Weight gain-not being able to work out since oral surgery  HENT: Negative.   Eyes: Negative.   Cardiovascular: Negative.   Gastrointestinal: Negative.   Genitourinary: Negative.   Musculoskeletal: Negative.   Skin: Negative.   Neurological: Negative.   Endo/Heme/Allergies: Negative.   Psychiatric/Behavioral: Negative.     Health Maintenance: Pap:  04-04-14 neg, 01-21-17 neg HPV HR neg History of Abnormal Pap: no MMG:  02-08-17 category a density birads 1:neg, Has one scheduled today Self Breast exams: no Colonoscopy:  2014 neg f/u 36yrs, patient to call and schedule BMD:   Unsure of when, but was normal TDaP:  2014 Shingles: had done Pneumonia: not done Hep C and HIV: both neg 2016 Labs:  If needs   reports that she has quit smoking. She has never used smokeless tobacco. She reports that she does not drink alcohol or use drugs.  Past Medical History:  Diagnosis Date  . Anxiety   . History of degenerative disc disease    mild  . Infertility, female   . Mild scoliosis   . Myofascial pain syndrome   . Wrist fracture      Past Surgical History:  Procedure Laterality Date  . BREAST CYST EXCISION Left   . CYSTECTOMY     from neck  . DILATION AND CURETTAGE OF UTERUS     times 2  . FOOT SURGERY Left 11/11   hammer toe & plantar fascitis  . MOUTH SURGERY     bone graft  . VEIN LIGATION AND STRIPPING      Current Outpatient Medications  Medication Sig Dispense Refill  . Calcium Carbonate-Vitamin D (CALCIUM + D PO) Take 1 tablet by mouth daily.    Mariane Baumgarten Calcium (STOOL SOFTENER PO) Apply 1 capsule topically at bedtime. Take every night per patient    . MAGNESIUM PO Take 1 tablet by mouth daily.    . Multiple Vitamins-Minerals (MULTIVITAMIN PO) Take by mouth daily.    . Omega-3 Fatty Acids (FISH OIL PO) Take 1 capsule by mouth every evening.     . TURMERIC PO Take by mouth daily.    Marland Kitchen venlafaxine XR (EFFEXOR-XR) 150 MG 24 hr capsule   0   No current facility-administered medications for this visit.     Family History  Problem Relation Age of Onset  . Colon cancer Mother   . Hypertension Father   . Anxiety disorder Father   . Diabetes Maternal Grandfather   . Hypertension Maternal Grandmother   . COPD Maternal Aunt   . Stroke Maternal Aunt   . Lung cancer Maternal Aunt  ROS:  Pertinent items are noted in HPI.  Otherwise, a comprehensive ROS was negative.  Exam:   BP 110/80   Pulse 72   Resp 16   Ht 5' 8.25" (1.734 m)   Wt 183 lb (83 kg)   LMP 01/18/2012   BMI 27.62 kg/m  Height: 5' 8.25" (173.4 cm) Ht Readings from Last 3 Encounters:  03/01/18 5' 8.25" (1.734 m)  01/21/17 5' 8.25" (1.734 m)  10/05/16 5\' 8"  (1.727 m)    General appearance: alert, cooperative and appears stated age Head: Normocephalic, without obvious abnormality, atraumatic Neck: no adenopathy, supple, symmetrical, trachea midline and thyroid normal to inspection and palpation Lungs: clear to auscultation bilaterally Breasts: normal appearance, no masses or tenderness, No nipple retraction or dimpling, No  nipple discharge or bleeding, No axillary or supraclavicular adenopathy Heart: regular rate and rhythm Abdomen: soft, non-tender; no masses,  no organomegaly Extremities: extremities normal, atraumatic, no cyanosis or edema Skin: Skin color, texture, turgor normal. No rashes or lesions Lymph nodes: Cervical, supraclavicular, and axillary nodes normal. No abnormal inguinal nodes palpated Neurologic: Grossly normal   Pelvic: External genitalia:  no lesions              Urethra:  normal appearing urethra with no masses, tenderness or lesions              Bartholin's and Skene's: normal                 Vagina: normal appearing vagina with normal color and discharge, no lesions              Cervix: multiparous appearance, no cervical motion tenderness and no lesions              Pap taken: No. Bimanual Exam:  Uterus:  normal size, contour, position, consistency, mobility, non-tender              Adnexa: normal adnexa and no mass, fullness, tenderness               Rectovaginal: Confirms               Anus:  normal sphincter tone, no lesions  Chaperone present: yes  A:  Well Woman with normal exam  Menopausal no HRT  Vaginal dryness using coconut or Olive oil as needed with good results  Depression history with Effexor treatment working well with PCP management. Colonoscopy due, patient will call for appointment   P:   Reviewed health and wellness pertinent to exam  Aware of need to advise if vaginal bleeding  Continue to use moisturizer for best result for comfort  Continue follow up with PCP as indicated  If problems scheduling will advise.  Pap smear: no  counseled on breast self exam, mammography screening, feminine hygiene, adequate intake of calcium and vitamin D, diet and exercise  return annually or prn  An After Visit Summary was printed and given to the patient.

## 2018-03-01 NOTE — Patient Instructions (Signed)

## 2018-03-14 ENCOUNTER — Encounter: Payer: Self-pay | Admitting: Certified Nurse Midwife

## 2018-03-29 DIAGNOSIS — H905 Unspecified sensorineural hearing loss: Secondary | ICD-10-CM | POA: Insufficient documentation

## 2018-05-20 ENCOUNTER — Encounter: Payer: Self-pay | Admitting: Certified Nurse Midwife

## 2018-09-18 ENCOUNTER — Ambulatory Visit (HOSPITAL_COMMUNITY)
Admission: EM | Admit: 2018-09-18 | Discharge: 2018-09-18 | Disposition: A | Discharge status: Home / Self Care | Payer: BC Managed Care – PPO | Attending: Emergency Medicine | Admitting: Emergency Medicine

## 2018-09-18 ENCOUNTER — Encounter (HOSPITAL_COMMUNITY): Payer: Self-pay

## 2018-09-18 ENCOUNTER — Other Ambulatory Visit: Payer: Self-pay

## 2018-09-18 DIAGNOSIS — R112 Nausea with vomiting, unspecified: Secondary | ICD-10-CM | POA: Diagnosis not present

## 2018-09-18 MED ORDER — ONDANSETRON 4 MG PO TBDP
4.0000 mg | ORAL_TABLET | Freq: Once | ORAL | Status: AC
Start: 2018-09-18 — End: 2018-09-18
  Administered 2018-09-18: 4 mg via ORAL

## 2018-09-18 MED ORDER — ONDANSETRON HCL 4 MG PO TABS: 4.0000 mg | ORAL_TABLET | Freq: Three times a day (TID) | ORAL | 0 refills | Status: DC | PRN

## 2018-09-18 MED ORDER — ONDANSETRON 4 MG PO TBDP
ORAL_TABLET | ORAL | Status: AC
  Filled 2018-09-18: qty 1

## 2018-09-18 NOTE — ED Triage Notes (Signed)
Pt cc cough , vomiting , stomach pain and dizziness and fatigued. This started last night.

## 2018-09-18 NOTE — ED Provider Notes (Signed)
[facility]    CSN: [id number] Arrival date & time: 09/18/18  1325     History   Chief Complaint Chief Complaint  Patient presents with  . Influenza    HPI Kristin Bass is a 58 y.o. female.   The history is provided by the patient.  Influenza  Presenting symptoms: nausea and vomiting   Presenting symptoms: no cough, no diarrhea, no fever, no shortness of breath and no sore throat   Severity:  Severe Onset quality:  Sudden Duration:  8 hours Progression:  Partially resolved Chronicity:  New Relieved by:  Nothing Worsened by:  Eating Ineffective treatments:  None tried Associated symptoms: chills   Associated symptoms: no ear pain, no mental status change, no congestion and no neck stiffness   Risk factors: no sick contacts     Past Medical History:  Diagnosis Date  . Anxiety   . History of degenerative disc disease    mild  . Infertility, female   . Mild scoliosis   . Myofascial pain syndrome   . Wrist fracture     Patient Active Problem List   Diagnosis Date Noted  . Back pain 09/09/2017  . OSA (obstructive sleep apnea) 12/07/2016  . Bilateral temporomandibular joint pain 08/23/2016  . Epidermoid cyst of skin 08/23/2016  . Snoring 08/23/2016  . 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 Sudden hearing loss, left 06/10/2016  . 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  . Radiculopathy of lumbosacral region 01/06/2015    Past Surgical History:  Procedure Laterality Date  . BREAST CYST EXCISION Left   . CYSTECTOMY     from neck  . DILATION AND CURETTAGE OF UTERUS     times 2  . FOOT SURGERY Left 11/11   hammer toe & plantar fascitis  . MOUTH SURGERY     bone graft  . VEIN LIGATION AND STRIPPING      OB History    Gravida  2   Para      Term      Preterm      AB  2   Living  0     SAB      TAB      Ectopic  2   Multiple      Live Births           Obstetric Comments  1 adopted children          Home Medications    Prior to Admission medications   Medication Sig Start Date End Date Taking? Authorizing Provider  Calcium Carbonate-Vitamin D (CALCIUM + D PO) Take 1 tablet by mouth daily.    [provider]  Docusate Calcium (STOOL SOFTENER PO) Apply 1 capsule topically at bedtime. Take every night per patient    [provider]  MAGNESIUM PO Take 1 tablet by mouth daily.    [provider]  Multiple Vitamins-Minerals (MULTIVITAMIN PO) Take by mouth daily.    [provider]  Omega-3 Fatty Acids (FISH OIL PO) Take 1 capsule by mouth every evening.     [provider]  ondansetron (ZOFRAN) 4 MG tablet Take 1 tablet (4 mg total) by mouth every 8 (eight) hours as needed for nausea or vomiting. 09/18/18   Katy Fitch, MD  TURMERIC PO Take by mouth daily.    [provider]  venlafaxine XR (EFFEXOR-XR) 150 MG 24 hr capsule  12/28/16   [provider]    Family History Family History  Problem Relation Age of Onset  . Colon cancer Mother   . Hypertension Father   . Anxiety disorder Father   . Diabetes Maternal Grandfather   . Hypertension Maternal Grandmother   . COPD Maternal Aunt   . Stroke Maternal Aunt   . Lung cancer Maternal Aunt     Social History Social History   Tobacco Use  . Smoking status: Former Research scientist (life sciences)  . Smokeless tobacco: Never Used  Substance Use Topics  . Alcohol use: No  . Drug use: No     Allergies   Ciprofloxacin; Macrobid [nitrofurantoin]; and Septra [sulfamethoxazole-trimethoprim]   Review of Systems Review of Systems  Constitutional: Positive for chills. Negative for fever.  HENT: Negative for congestion, ear pain, sore throat and trouble swallowing.   Eyes: Negative for pain and visual disturbance.  Respiratory: Negative for cough and shortness of breath.   Cardiovascular: Negative for chest pain and palpitations.  Gastrointestinal: Positive for nausea and vomiting. Negative  for abdominal pain and diarrhea.  Genitourinary: Negative for dysuria and hematuria.  Musculoskeletal: Negative for arthralgias, back pain and neck stiffness.  Skin: Negative for color change and rash.  Neurological: Negative for seizures and syncope.  All other systems reviewed and are negative.    Physical Exam Triage Vital Signs ED Triage Vitals  Enc Vitals Group     BP 09/18/18 1344 102/68     Pulse Rate 09/18/18 1344 93     Resp 09/18/18 1344 18     Temp 09/18/18 1344 98.1 F (36.7 C)     Temp src --      SpO2 09/18/18 1344 99 %     Weight 09/18/18 1347 178 lb (80.7 kg)     Height --      Head Circumference --      Peak Flow --      Pain Score 09/18/18 1347 10     Pain Loc --      Pain Edu? --      Excl. in [place]? --    No data found.  Updated Vital Signs BP 102/68 (BP Location: Right Arm)   Pulse 93   Temp 98.1 F (36.7 C)   Resp 18   Wt 80.7 kg   LMP 01/18/2012   SpO2 99%   BMI 26.87 kg/m   Visual Acuity Right Eye Distance:   Left Eye Distance:   Bilateral Distance:    Right Eye Near:   Left Eye Near:    Bilateral Near:     Physical Exam Vitals signs and nursing note reviewed.  Constitutional:      General: She is not in acute distress.    Appearance: She is well-developed.  HENT:     Head: Normocephalic and atraumatic.  Eyes:     Conjunctiva/sclera: Conjunctivae normal.  Neck:     Musculoskeletal: Neck supple.  Cardiovascular:     Rate and Rhythm: Normal rate and regular rhythm.     Heart sounds: No murmur.  Pulmonary:     Effort: Pulmonary effort is normal. No respiratory distress.     Breath sounds: Normal breath sounds.  Abdominal:     Palpations: Abdomen is soft.     Tenderness: There is no abdominal tenderness.  Skin:    General: Skin is warm and dry.  Neurological:     Mental Status: She is alert.      UC Treatments / Results  Labs (all labs ordered are listed, but only abnormal results are displayed) Labs Reviewed -  No  data to display  EKG None  Radiology No results found.  Procedures Procedures (including critical care time)  Medications Ordered in UC Medications  ondansetron (ZOFRAN-ODT) disintegrating tablet 4 mg (has no administration in time range)    Initial Impression / Assessment and Plan / UC Course  I have reviewed the triage vital signs and the nursing notes.  Pertinent labs & imaging results that were available during my care of the patient were reviewed by me and considered in my medical decision making (see chart for details).     The patient is well-appearing with normal vital signs.  She likely has had a viral syndrome.  No vomiting for almost 12 hours.  No flu symptoms.  I would recommend symptomatic treatment.  Return precautions were discussed.  Abdominal exam is normal and not consistent with intra-abdominal pathology. Final Clinical Impressions(s) / UC Diagnoses   Final diagnoses:  Non-intractable vomiting with nausea, unspecified vomiting type   Discharge Instructions   None    ED Prescriptions    Medication Sig Dispense Auth. Provider   ondansetron (ZOFRAN) 4 MG tablet Take 1 tablet (4 mg total) by mouth every 8 (eight) hours as needed for nausea or vomiting. 4 tablet Katy Fitch, MD     Controlled Substance Prescriptions [place] Controlled Substance Registry consulted? No   Katy Fitch, MD 09/18/18 1406

## 2018-09-25 ENCOUNTER — Encounter (HOSPITAL_COMMUNITY): Payer: Self-pay | Admitting: Emergency Medicine

## 2018-09-25 ENCOUNTER — Ambulatory Visit (HOSPITAL_COMMUNITY)
Admission: EM | Admit: 2018-09-25 | Discharge: 2018-09-25 | Disposition: A | Discharge status: Home / Self Care | Payer: BC Managed Care – PPO | Attending: Family Medicine | Admitting: Family Medicine

## 2018-09-25 DIAGNOSIS — J069 Acute upper respiratory infection, unspecified: Secondary | ICD-10-CM

## 2018-09-25 MED ORDER — METHYLPREDNISOLONE ACETATE 80 MG/ML IJ SUSP
80.0000 mg | Freq: Once | INTRAMUSCULAR | Status: AC
  Administered 2018-09-25: 80 mg via INTRAMUSCULAR

## 2018-09-25 MED ORDER — METHYLPREDNISOLONE ACETATE 80 MG/ML IJ SUSP
INTRAMUSCULAR | Status: AC
  Filled 2018-09-25: qty 1

## 2018-09-25 MED ORDER — GUAIFENESIN ER 600 MG PO TB12: 1200.0000 mg | ORAL_TABLET | Freq: Two times a day (BID) | ORAL | 0 refills | Status: AC | PRN

## 2018-09-25 MED ORDER — TRIAMCINOLONE ACETONIDE 55 MCG/ACT NA AERO: 2.0000 | INHALATION_SPRAY | Freq: Every day | NASAL | 12 refills | Status: DC

## 2018-09-25 NOTE — Discharge Instructions (Addendum)
Push fluids to ensure adequate hydration and keep secretions thin.  Tylenol and/or ibuprofen as needed for pain or fevers.  Daily nasal spray.  Use of over the counter treatments as needed for symptoms.  Cover cough, wash hands regularly.  Rest.  If symptoms worsen or do not improve in the next week to return to be seen or to follow up with your PCP.

## 2018-09-25 NOTE — ED Triage Notes (Signed)
Pt here with URI sx and nasal congestion

## 2018-09-26 ENCOUNTER — Emergency Department (HOSPITAL_COMMUNITY)
Admission: EM | Admit: 2018-09-26 | Discharge: 2018-09-26 | Disposition: A | Discharge status: Home / Self Care | Payer: BC Managed Care – PPO | Attending: Emergency Medicine | Admitting: Emergency Medicine

## 2018-09-26 ENCOUNTER — Other Ambulatory Visit: Payer: Self-pay

## 2018-09-26 ENCOUNTER — Emergency Department (HOSPITAL_COMMUNITY): Payer: BC Managed Care – PPO

## 2018-09-26 ENCOUNTER — Encounter (HOSPITAL_COMMUNITY): Payer: Self-pay | Admitting: Oncology

## 2018-09-26 DIAGNOSIS — R51 Headache: Secondary | ICD-10-CM | POA: Diagnosis present

## 2018-09-26 DIAGNOSIS — Z79899 Other long term (current) drug therapy: Secondary | ICD-10-CM | POA: Insufficient documentation

## 2018-09-26 DIAGNOSIS — B349 Viral infection, unspecified: Secondary | ICD-10-CM | POA: Insufficient documentation

## 2018-09-26 DIAGNOSIS — Z87891 Personal history of nicotine dependence: Secondary | ICD-10-CM | POA: Insufficient documentation

## 2018-09-26 DIAGNOSIS — R519 Headache, unspecified: Secondary | ICD-10-CM

## 2018-09-26 LAB — CBC WITH DIFFERENTIAL/PLATELET
Abs Immature Granulocytes: 0.01 10*3/uL (ref 0.00–0.07)
Basophils Absolute: 0 10*3/uL (ref 0.0–0.1)
Basophils Relative: 0 %
Eosinophils Absolute: 0.1 10*3/uL (ref 0.0–0.5)
Eosinophils Relative: 1 %
HCT: 39.7 % (ref 36.0–46.0)
Hemoglobin: 12.5 g/dL (ref 12.0–15.0)
Immature Granulocytes: 0 %
Lymphocytes Relative: 12 %
Lymphs Abs: 0.7 10*3/uL (ref 0.7–4.0)
MCH: 30 pg (ref 26.0–34.0)
MCHC: 31.5 g/dL (ref 30.0–36.0)
MCV: 95.4 fL (ref 80.0–100.0)
Monocytes Absolute: 0.8 10*3/uL (ref 0.1–1.0)
Monocytes Relative: 13 %
Neutro Abs: 4.5 10*3/uL (ref 1.7–7.7)
Neutrophils Relative %: 74 %
Platelets: 161 10*3/uL (ref 150–400)
RBC: 4.16 MIL/uL (ref 3.87–5.11)
RDW: 12.4 % (ref 11.5–15.5)
WBC: 6 10*3/uL (ref 4.0–10.5)
nRBC: 0 % (ref 0.0–0.2)

## 2018-09-26 LAB — BASIC METABOLIC PANEL
Anion gap: 8 (ref 5–15)
BUN: 13 mg/dL (ref 6–20)
CO2: 24 mmol/L (ref 22–32)
Calcium: 8.7 mg/dL — ABNORMAL LOW (ref 8.9–10.3)
Chloride: 105 mmol/L (ref 98–111)
Creatinine, Ser: 0.68 mg/dL (ref 0.44–1.00)
GFR calc Af Amer: >60 mL/min (ref >60)
GFR calc non Af Amer: >60 mL/min (ref >60)
Glucose, Bld: 130 mg/dL — ABNORMAL HIGH (ref 70–99)
Potassium: 3.6 mmol/L (ref 3.5–5.1)
Sodium: 137 mmol/L (ref 135–145)

## 2018-09-26 LAB — LACTIC ACID, PLASMA: Lactic Acid, Venous: 0.7 mmol/L (ref 0.5–1.9)

## 2018-09-26 MED ORDER — DIPHENHYDRAMINE HCL 50 MG/ML IJ SOLN
25.0000 mg | Freq: Once | INTRAMUSCULAR | Status: AC
  Administered 2018-09-26: 25 mg via INTRAVENOUS
  Filled 2018-09-26: qty 1

## 2018-09-26 MED ORDER — PROCHLORPERAZINE EDISYLATE 10 MG/2ML IJ SOLN
10.0000 mg | Freq: Once | INTRAMUSCULAR | Status: AC
  Administered 2018-09-26: 10 mg via INTRAVENOUS
  Filled 2018-09-26: qty 2

## 2018-09-26 MED ORDER — KETOROLAC TROMETHAMINE 30 MG/ML IJ SOLN
15.0000 mg | Freq: Once | INTRAMUSCULAR | Status: AC
  Administered 2018-09-26: 15 mg via INTRAVENOUS
  Filled 2018-09-26: qty 1

## 2018-09-26 MED ORDER — SODIUM CHLORIDE 0.9 % IV BOLUS
1000.0000 mL | Freq: Once | INTRAVENOUS | Status: AC
  Administered 2018-09-26: 1000 mL via INTRAVENOUS

## 2018-09-26 NOTE — ED Provider Notes (Signed)
[facility] EMERGENCY DEPARTMENT Provider Note   CSN: [id number] Arrival date & time: 09/26/18  0139    History   Chief Complaint Chief Complaint  Patient presents with  . Headache    HPI Kristin Bass is a 58 y.o. female.     Patient presents to the emergency department for evaluation of headache.  Patient has been sick for the last couple of days with upper respiratory infection symptoms.  She has been experiencing cough, chest congestion, was seen at urgent care earlier today.  She was told she had a viral process, since she left the urgent care she has had progressively worsening headache.  Patient complaining of a diffuse pounding headache that is severe.  No focal neurologic deficits or findings.  No neck pain or stiffness.     Past Medical History:  Diagnosis Date  . Anxiety   . History of degenerative disc disease    mild  . Infertility, female   . Mild scoliosis   . Myofascial pain syndrome   . Wrist fracture     Patient Active Problem List   Diagnosis Date Noted  . Back pain 09/09/2017  . OSA (obstructive sleep apnea) 12/07/2016  . Bilateral temporomandibular joint pain 08/23/2016  . Epidermoid cyst of skin 08/23/2016  . Snoring 08/23/2016  . 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 Sudden hearing loss, left 06/10/2016  . 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  . Radiculopathy of lumbosacral region 01/06/2015    Past Surgical History:  Procedure Laterality Date  . BREAST CYST EXCISION Left   . CYSTECTOMY     from neck  . DILATION AND CURETTAGE OF UTERUS     times 2  . FOOT SURGERY Left 11/11   hammer toe & plantar fascitis  . MOUTH SURGERY     bone graft  . VEIN LIGATION AND STRIPPING       OB History    Gravida  2   Para      Term      Preterm      AB  2   Living  0     SAB      TAB      Ectopic  2   Multiple      Live Births           Obstetric Comments  1 adopted children          Home Medications    Prior to Admission medications   Medication Sig Start Date End Date Taking? Authorizing Provider  acetaminophen (TYLENOL) 500 MG tablet Take 500 mg by mouth every 6 (six) hours as needed for headache.   Yes [provider]  Calcium Carbonate-Vitamin D (CALCIUM + D PO) Take 1 tablet by mouth daily.   Yes [provider]  Docusate Calcium (STOOL SOFTENER PO) Apply 1 capsule topically at bedtime.    Yes [provider]  guaiFENesin (MUCINEX) 600 MG 12 hr tablet Take 2 tablets (1,200 mg total) by mouth 2 (two) times daily as needed for up to 5 days. 09/25/18 09/30/18 Yes Burky, Lanelle Bal B, NP  MAGNESIUM PO Take 1 tablet by mouth daily.   Yes [provider]  Multiple Vitamins-Minerals (MULTIVITAMIN PO) Take 1 tablet by mouth daily.    Yes [provider]  Omega-3 Fatty Acids (FISH OIL PO) Take 1 capsule by mouth every evening.    Yes [provider]  ondansetron (ZOFRAN) 4 MG tablet Take 1 tablet (4 mg  total) by mouth every 8 (eight) hours as needed for nausea or vomiting. 09/18/18  Yes Katy Fitch, MD  triamcinolone (NASACORT) 55 MCG/ACT AERO nasal inhaler Place 2 sprays into the nose daily. 09/25/18  Yes Burky, Lanelle Bal B, NP  TURMERIC PO Take 1 tablet by mouth daily.    Yes [provider]  venlafaxine XR (EFFEXOR-XR) 150 MG 24 hr capsule Take 150 mg by mouth daily.  12/28/16  Yes [provider]    Family History Family History  Problem Relation Age of Onset  . Colon cancer Mother   . Hypertension Father   . Anxiety disorder Father   . Diabetes Maternal Grandfather   . Hypertension Maternal Grandmother   . COPD Maternal Aunt   . Stroke Maternal Aunt   . Lung cancer Maternal Aunt     Social History Social History   Tobacco Use  . Smoking status: Former Research scientist (life sciences)  . Smokeless tobacco: Never Used  Substance Use Topics  . Alcohol use: No  . Drug use: No     Allergies   Ciprofloxacin;  Macrobid [nitrofurantoin]; and Septra [sulfamethoxazole-trimethoprim]   Review of Systems Review of Systems  Respiratory: Positive for cough.   Gastrointestinal: Positive for nausea and vomiting.  Neurological: Positive for headaches.  All other systems reviewed and are negative.    Physical Exam Updated Vital Signs BP 104/63 (BP Location: Left Arm)   Pulse 80   Temp 97.7 F (36.5 C) (Oral)   Resp 15   Ht 5' 8.5" (1.74 m)   Wt 80.7 kg   LMP 01/18/2012   SpO2 96%   BMI 26.67 kg/m   Physical Exam Vitals signs and nursing note reviewed.  Constitutional:      General: She is not in acute distress.    Appearance: Normal appearance. She is well-developed.  HENT:     Head: Normocephalic and atraumatic.     Right Ear: Hearing normal.     Left Ear: Hearing normal.     Nose: Nose normal.  Eyes:     Conjunctiva/sclera: Conjunctivae normal.     Pupils: Pupils are equal, round, and reactive to light.  Neck:     Musculoskeletal: Normal range of motion and neck supple.  Cardiovascular:     Rate and Rhythm: Regular rhythm.     Heart sounds: S1 normal and S2 normal. No murmur. No friction rub. No gallop.   Pulmonary:     Effort: Pulmonary effort is normal. No respiratory distress.     Breath sounds: Normal breath sounds.  Chest:     Chest wall: No tenderness.  Abdominal:     General: Bowel sounds are normal.     Palpations: Abdomen is soft.     Tenderness: There is no abdominal tenderness. There is no guarding or rebound. Negative signs include Murphy's sign and McBurney's sign.     Hernia: No hernia is present.  Musculoskeletal: Normal range of motion.  Skin:    General: Skin is warm and dry.     Findings: No rash.  Neurological:     Mental Status: She is alert and oriented to person, place, and time.     GCS: GCS eye subscore is 4. GCS verbal subscore is 5. GCS motor subscore is 6.     Cranial Nerves: No cranial nerve deficit.     Sensory: No sensory deficit.      Coordination: Coordination normal.  Psychiatric:        Speech: Speech normal.  Behavior: Behavior normal.        Thought Content: Thought content normal.      ED Treatments / Results  Labs (all labs ordered are listed, but only abnormal results are displayed) Labs Reviewed  BASIC METABOLIC PANEL - Abnormal; Notable for the following components:      Result Value   Glucose, Bld 130 (*)    Calcium 8.7 (*)    All other components within normal limits  CBC WITH DIFFERENTIAL/PLATELET  LACTIC ACID, PLASMA    EKG None  Radiology Ct Head Wo Contrast  Result Date: 09/26/2018 CLINICAL DATA:  Headache and fever EXAM: CT HEAD WITHOUT CONTRAST TECHNIQUE: Contiguous axial images were obtained from the base of the skull through the vertex without intravenous contrast. COMPARISON:  None. FINDINGS: Brain: No evidence of acute infarction, hemorrhage, hydrocephalus, extra-axial collection or mass lesion/mass effect. Vascular: No hyperdense vessel or unexpected calcification. Skull: Normal. Negative for fracture or focal lesion. Sinuses/Orbits: Generalized mucosal thickening in the paranasal sinuses without fluid level IMPRESSION: 1. Negative intracranial imaging. 2. Generalized sinusitis without fluid level. Electronically Signed   By: Monte Fantasia M.D.   On: 09/26/2018 04:06    Procedures Procedures (including critical care time)  Medications Ordered in ED Medications  sodium chloride 0.9 % bolus 1,000 mL (0 mLs Intravenous Stopped 09/26/18 0432)  ketorolac (TORADOL) 30 MG/ML injection 15 mg (15 mg Intravenous Given 09/26/18 0312)  prochlorperazine (COMPAZINE) injection 10 mg (10 mg Intravenous Given 09/26/18 0312)  diphenhydrAMINE (BENADRYL) injection 25 mg (25 mg Intravenous Given 09/26/18 7654)     Initial Impression / Assessment and Plan / ED Course  I have reviewed the triage vital signs and the nursing notes.  Pertinent labs & imaging results that were available during my care of  the patient were reviewed by me and considered in my medical decision making (see chart for details).        Patient presents to the ER for evaluation of worsening headache.  She was seen in urgent care with flulike symptoms, treated symptomatically.  Since that time, however, she has developed this headache and reported that it was quite severe at arrival.  She did not have any focal neurologic deficits.  No evidence of meningismus, neck is supple with full range of motion.  CT head unremarkable.  Lab work was normal.  Patient was administered an IV fluid bolus, Toradol, Compazine, Benadryl.  She has had complete resolution of her headache.  Final Clinical Impressions(s) / ED Diagnoses   Final diagnoses:  Bad headache  Viral illness    ED Discharge Orders    None       Orpah Greek, MD 09/26/18 [phone]

## 2018-09-26 NOTE — ED Triage Notes (Signed)
Pt bib [facility] from home d/t headache and fever.  Pt seen here on 3/8 dx w/ URI.  Pt took 1300 mg of tylenol prior to EMS arrival.  Pt rates her pain 10/10 all over her head, pounding in nature.

## 2018-09-26 NOTE — ED Provider Notes (Signed)
[facility]    CSN: [id number] Arrival date & time: 09/25/18  1318     History   Chief Complaint Chief Complaint  Patient presents with  . URI    HPI Kristin Bass is a 58 y.o. female.   Kristin Bass presents with complaints of congestion, dry cough and eye pressure. Symptoms started two days ago with eye redness. Saw her PCP and was started on visine and claritin and her eyes have improved. However now with this dry cough and congestion. No chest pain , no shortness of breath , no sore throat. No known fevers. Has taken ibuprofen which did not seem to help. 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 No gi/gu complaints. No skin rash. Without contributing medical history.      ROS per HPI, negative if not otherwise mentioned.      Past Medical History:  Diagnosis Date  . Anxiety   . History of degenerative disc disease    mild  . Infertility, female   . Mild scoliosis   . Myofascial pain syndrome   . Wrist fracture     Patient Active Problem List   Diagnosis Date Noted  . Back pain 09/09/2017  . OSA (obstructive sleep apnea) 12/07/2016  . Bilateral temporomandibular joint pain 08/23/2016  . Epidermoid cyst of skin 08/23/2016  . Snoring 08/23/2016  . 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 Sudden hearing loss, left 06/10/2016  . 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  . Radiculopathy of lumbosacral region 01/06/2015    Past Surgical History:  Procedure Laterality Date  . BREAST CYST EXCISION Left   . CYSTECTOMY     from neck  . DILATION AND CURETTAGE OF UTERUS     times 2  . FOOT SURGERY Left 11/11   hammer toe & plantar fascitis  . MOUTH SURGERY     bone graft  . VEIN LIGATION AND STRIPPING      OB History    Gravida  2   Para      Term      Preterm      AB  2   Living  0     SAB      TAB      Ectopic  2   Multiple      Live Births           Obstetric Comments  1 adopted children          Home Medications    Prior to Admission medications   Medication Sig Start Date End Date Taking? Authorizing Provider  acetaminophen (TYLENOL) 500 MG tablet Take 500 mg by mouth every 6 (six) hours as needed for headache.    [provider]  Calcium Carbonate-Vitamin D (CALCIUM + D PO) Take 1 tablet by mouth daily.    [provider]  Docusate Calcium (STOOL SOFTENER PO) Apply 1 capsule topically at bedtime.     [provider]  guaiFENesin (MUCINEX) 600 MG 12 hr tablet Take 2 tablets (1,200 mg total) by mouth 2 (two) times daily as needed for up to 5 days. 09/25/18 09/30/18  Augusto Gamble B, NP  MAGNESIUM PO Take 1 tablet by mouth daily.    [provider]  Multiple Vitamins-Minerals (MULTIVITAMIN PO) Take 1 tablet by mouth daily.     [provider]  Omega-3 Fatty Acids (FISH OIL PO) Take 1 capsule by mouth every evening.     [provider]  ondansetron (  ZOFRAN) 4 MG tablet Take 1 tablet (4 mg total) by mouth every 8 (eight) hours as needed for nausea or vomiting. 09/18/18   Katy Fitch, MD  triamcinolone (NASACORT) 55 MCG/ACT AERO nasal inhaler Place 2 sprays into the nose daily. 09/25/18   Augusto Gamble B, NP  TURMERIC PO Take 1 tablet by mouth daily.     [provider]  venlafaxine XR (EFFEXOR-XR) 150 MG 24 hr capsule Take 150 mg by mouth daily.  12/28/16   [provider]    Family History Family History  Problem Relation Age of Onset  . Colon cancer Mother   . Hypertension Father   . Anxiety disorder Father   . Diabetes Maternal Grandfather   . Hypertension Maternal Grandmother   . COPD Maternal Aunt   . Stroke Maternal Aunt   . Lung cancer Maternal Aunt     Social History Social History   Tobacco Use  . Smoking status: Former Research scientist (life sciences)  . Smokeless tobacco: Never Used  Substance Use Topics  . Alcohol use: No  . Drug use: No     Allergies   Ciprofloxacin; Macrobid [nitrofurantoin]; and Septra  [sulfamethoxazole-trimethoprim]   Review of Systems Review of Systems   Physical Exam Triage Vital Signs ED Triage Vitals [09/25/18 1448]  Enc Vitals Group     BP 114/72     Pulse Rate 76     Resp 18     Temp 98.7 F (37.1 C)     Temp Source Temporal     SpO2 100 %     Weight      Height      Head Circumference      Peak Flow      Pain Score 5     Pain Loc      Pain Edu?      Excl. in [place]?    No data found.  Updated Vital Signs BP 114/72 (BP Location: Right Arm)   Pulse 76   Temp 98.7 F (37.1 C) (Temporal)   Resp 18   LMP 01/18/2012   SpO2 100%   Visual Acuity Right Eye Distance:   Left Eye Distance:   Bilateral Distance:    Right Eye Near:   Left Eye Near:    Bilateral Near:     Physical Exam Constitutional:      General: She is not in acute distress.    Appearance: She is well-developed.  HENT:     Head: Normocephalic and atraumatic.     Right Ear: Tympanic membrane, ear canal and external ear normal.     Left Ear: Tympanic membrane, ear canal and external ear normal.     Nose: Mucosal edema and rhinorrhea present.     Mouth/Throat:     Pharynx: Uvula midline.     Tonsils: No tonsillar exudate.  Eyes:     Conjunctiva/sclera: Conjunctivae normal.     Pupils: Pupils are equal, round, and reactive to light.  Cardiovascular:     Rate and Rhythm: Normal rate and regular rhythm.     Heart sounds: Normal heart sounds.  Pulmonary:     Effort: Pulmonary effort is normal.     Breath sounds: Normal breath sounds.  Skin:    General: Skin is warm and dry.  Neurological:     Mental Status: She is alert and oriented to person, place, and time.      UC Treatments / Results  Labs (all labs ordered are listed, but only abnormal  results are displayed) Labs Reviewed - No data to display  EKG None  Radiology Ct Head Wo Contrast  Result Date: 09/26/2018 CLINICAL DATA:  Headache and fever EXAM: CT HEAD WITHOUT CONTRAST TECHNIQUE: Contiguous axial  images were obtained from the base of the skull through the vertex without intravenous contrast. COMPARISON:  None. FINDINGS: Brain: No evidence of acute infarction, hemorrhage, hydrocephalus, extra-axial collection or mass lesion/mass effect. Vascular: No hyperdense vessel or unexpected calcification. Skull: Normal. Negative for fracture or focal lesion. Sinuses/Orbits: Generalized mucosal thickening in the paranasal sinuses without fluid level IMPRESSION: 1. Negative intracranial imaging. 2. Generalized sinusitis without fluid level. Electronically Signed   By: Monte Fantasia M.D.   On: 09/26/2018 04:06    Procedures Procedures (including critical care time)  Medications Ordered in UC Medications  methylPREDNISolone acetate (DEPO-MEDROL) injection 80 mg (80 mg Intramuscular Given 09/25/18 1527)    Initial Impression / Assessment and Plan / UC Course  I have reviewed the triage vital signs and the nursing notes.  Pertinent labs & imaging results that were available during my care of the patient were reviewed by me and considered in my medical decision making (see chart for details).     Benign physical exam. Non toxic. History and physical consistent with viral illness.  Supportive cares recommended. If symptoms worsen or do not improve in the next week to return to be seen or to follow up with PCP.  Patient verbalized understanding and agreeable to plan.   Final Clinical Impressions(s) / UC Diagnoses   Final diagnoses:  Upper respiratory tract infection, unspecified type     Discharge Instructions     Push fluids to ensure adequate hydration and keep secretions thin.  Tylenol and/or ibuprofen as needed for pain or fevers.  Daily nasal spray.  Use of over the counter treatments as needed for symptoms.  Cover cough, wash hands regularly.  Rest.  If symptoms worsen or do not improve in the next week to return to be seen or to follow up with your PCP.     ED Prescriptions     Medication Sig Dispense Auth. Provider   triamcinolone (NASACORT) 55 MCG/ACT AERO nasal inhaler Place 2 sprays into the nose daily. 1 Inhaler Augusto Gamble B, NP   guaiFENesin (MUCINEX) 600 MG 12 hr tablet Take 2 tablets (1,200 mg total) by mouth 2 (two) times daily as needed for up to 5 days. 20 tablet Zigmund Gottron, NP     Controlled Substance Prescriptions [place] Controlled Substance Registry consulted? Not Applicable   Zigmund Gottron, NP 09/26/18 1234

## 2018-12-13 DIAGNOSIS — M720 Palmar fascial fibromatosis [Dupuytren]: Secondary | ICD-10-CM | POA: Insufficient documentation

## 2018-12-25 DIAGNOSIS — M25551 Pain in right hip: Secondary | ICD-10-CM | POA: Insufficient documentation

## 2019-03-13 ENCOUNTER — Encounter: Payer: Self-pay | Admitting: Certified Nurse Midwife

## 2019-05-04 ENCOUNTER — Other Ambulatory Visit: Payer: Self-pay

## 2019-05-05 ENCOUNTER — Ambulatory Visit (INDEPENDENT_AMBULATORY_CARE_PROVIDER_SITE_OTHER): Payer: BC Managed Care – PPO | Admitting: Certified Nurse Midwife

## 2019-05-05 ENCOUNTER — Other Ambulatory Visit: Payer: Self-pay

## 2019-05-05 ENCOUNTER — Encounter: Payer: Self-pay | Admitting: Certified Nurse Midwife

## 2019-05-05 VITALS — BP 112/80 | HR 68 | Temp 97.2°F | Resp 16 | Ht 68.0 in | Wt 193.0 lb

## 2019-05-05 DIAGNOSIS — Z01419 Encounter for gynecological examination (general) (routine) without abnormal findings: Secondary | ICD-10-CM | POA: Diagnosis not present

## 2019-05-05 DIAGNOSIS — Z23 Encounter for immunization: Secondary | ICD-10-CM | POA: Diagnosis not present

## 2019-05-05 DIAGNOSIS — N952 Postmenopausal atrophic vaginitis: Secondary | ICD-10-CM | POA: Diagnosis not present

## 2019-05-05 DIAGNOSIS — E559 Vitamin D deficiency, unspecified: Secondary | ICD-10-CM | POA: Diagnosis not present

## 2019-05-05 DIAGNOSIS — Z0000 Encounter for general adult medical examination without abnormal findings: Secondary | ICD-10-CM

## 2019-05-05 DIAGNOSIS — R635 Abnormal weight gain: Secondary | ICD-10-CM

## 2019-05-05 NOTE — Progress Notes (Signed)
58 y.o. [id number] Married  Caucasian Fe here for annual exam. Menopausal no HRT occasional hot flash only. No vaginal bleeding. Exercises 5 days a week with power walking. Seeing a Social worker for [employer]. Taking daily probiotic and feels better now. Not sexually active and OK with now. Sees PCP Dr. Moreen Fowler prn. Desires screening labs today. No other health issues today.  Patient's last menstrual period was 01/18/2012.          Sexually active: No.  The current method of family planning is vasectomy.    Exercising: Yes.    power walking Smoker:  no  Review of Systems  Constitutional: Negative.   HENT: Negative.   Eyes: Negative.   Respiratory: Negative.   Cardiovascular: Negative.   Gastrointestinal: Negative.   Genitourinary: Negative.   Musculoskeletal: Negative.   Skin: Negative.   Neurological: Negative.   Endo/Heme/Allergies: Negative.   Psychiatric/Behavioral: Negative.     Health Maintenance: Pap:  01-21-17 neg HPV HR neg History of Abnormal Pap: no MMG:  03-13-2019 category a density birads 1:neg Self Breast exams: no Colonoscopy:  2019 f/u 46yrs BMD:  Had done unsure of date TDaP:  2014 Shingles: had done Pneumonia: not done Hep C and HIV: both neg 2016 Labs: yes   reports that she has quit smoking. She has never used smokeless tobacco. She reports that she does not drink alcohol or use drugs.  Past Medical History:  Diagnosis Date  . Anxiety   . History of degenerative disc disease    mild  . Infertility, female   . Mild scoliosis   . Myofascial pain syndrome   . Precancerous lesion   . Wrist fracture     Past Surgical History:  Procedure Laterality Date  . BREAST CYST EXCISION Left   . CYSTECTOMY     from neck  . DILATION AND CURETTAGE OF UTERUS     times 2  . FOOT SURGERY Left 11/11   hammer toe & plantar fascitis  . MOUTH SURGERY     bone graft  . VEIN LIGATION AND STRIPPING      Current Outpatient Medications  Medication Sig Dispense  Refill  . acetaminophen (TYLENOL) 500 MG tablet Take 500 mg by mouth every 6 (six) hours as needed for headache.    . Calcium Carbonate-Vitamin D (CALCIUM + D PO) Take 1 tablet by mouth daily.    Mariane Baumgarten Calcium (STOOL SOFTENER PO) Apply 1 capsule topically at bedtime.     . fluticasone (FLONASE) 50 MCG/ACT nasal spray as needed.    . Multiple Vitamins-Minerals (MULTIVITAMIN PO) Take 1 tablet by mouth daily.     . Omega-3 Fatty Acids (FISH OIL PO) Take 1 capsule by mouth every evening.     . Probiotic Product (PROBIOTIC PO) Take by mouth.    . TURMERIC PO Take 1 tablet by mouth daily.     Marland Kitchen venlafaxine XR (EFFEXOR-XR) 150 MG 24 hr capsule Take 150 mg by mouth daily.   0   No current facility-administered medications for this visit.     Family History  Problem Relation Age of Onset  . Colon cancer Mother   . Hypertension Father   . Anxiety disorder Father   . Diabetes Maternal Grandfather   . Hypertension Maternal Grandmother   . COPD Maternal Aunt   . Stroke Maternal Aunt   . Lung cancer Maternal Aunt     ROS:  Pertinent items are noted in HPI.  Otherwise, a comprehensive ROS was negative.  Exam:   BP 112/80   Pulse 68   Temp (!) 97.2 F (36.2 C) (Skin)   Resp 16   Ht 5\' 8"  (1.727 m)   Wt 193 lb (87.5 kg)   LMP 01/18/2012   BMI 29.35 kg/m  Height: 5\' 8"  (172.7 cm) Ht Readings from Last 3 Encounters:  05/05/19 5\' 8"  (1.727 m)  09/26/18 5' 8.5" (1.74 m)  03/01/18 5' 8.25" (1.734 m)    General appearance: alert, cooperative and appears stated age Head: Normocephalic, without obvious abnormality, atraumatic Neck: no adenopathy, supple, symmetrical, trachea midline and thyroid normal to inspection and palpation Lungs: clear to auscultation bilaterally Breasts: normal appearance, no masses or tenderness, No nipple retraction or dimpling, No nipple discharge or bleeding, No axillary or supraclavicular adenopathy Heart: regular rate and rhythm Abdomen: soft, non-tender;  no masses,  no organomegaly Extremities: extremities normal, atraumatic, no cyanosis or edema Skin: Skin color, texture, turgor normal. No rashes or lesions Lymph nodes: Cervical, supraclavicular, and axillary nodes normal. No abnormal inguinal nodes palpated Neurologic: Grossly normal   Pelvic: External genitalia:  no lesions              Urethra:  normal appearing urethra with no masses, tenderness or lesions              Bartholin's and Skene's: normal                 Vagina: normal appearing vagina with normal color and discharge, no lesions              Cervix: no cervical motion tenderness and no lesions              Pap taken: No. Bimanual Exam:  Uterus:  normal size, contour, position, consistency, mobility, non-tender              Adnexa: normal adnexa and no mass, fullness, tenderness               Rectovaginal: Confirms               Anus:  normal sphincter tone, no lesions  Chaperone present: yes  A:  Well Woman with normal exam  Menopausal no HRT  Anxiety/depression with MD management of Effexor  Screening labs  P:   Reviewed health and wellness pertinent to exam  Aware of need to advise if vaginal bleeding.  Continue follow up with as indicated with MD.  Labs: CMP, Lipid panel, TSH, Vitamin D, CBC  Pap smear: no   counseled on breast self exam, mammography screening, feminine hygiene, adequate intake of calcium and vitamin D, diet and exercise  return annually or prn  An After Visit Summary was printed and given to the patient.

## 2019-05-05 NOTE — Patient Instructions (Signed)

## 2019-05-06 LAB — COMPREHENSIVE METABOLIC PANEL
ALT: 17 IU/L (ref 0–32)
AST: 20 IU/L (ref 0–40)
Albumin/Globulin Ratio: 2.1 (ref 1.2–2.2)
Albumin: 4.1 g/dL (ref 3.8–4.9)
Alkaline Phosphatase: 77 IU/L (ref 39–117)
BUN/Creatinine Ratio: 20 (ref 9–23)
BUN: 20 mg/dL (ref 6–24)
Bilirubin Total: <0.2 mg/dL (ref 0.0–1.2)
CO2: 29 mmol/L (ref 20–29)
Calcium: 9.4 mg/dL (ref 8.7–10.2)
Chloride: 102 mmol/L (ref 96–106)
Creatinine, Ser: 0.98 mg/dL (ref 0.57–1.00)
GFR calc Af Amer: 74 mL/min/{1.73_m2} (ref >59)
GFR calc non Af Amer: 64 mL/min/{1.73_m2} (ref >59)
Globulin, Total: 2 g/dL (ref 1.5–4.5)
Glucose: 86 mg/dL (ref 65–99)
Potassium: 4.6 mmol/L (ref 3.5–5.2)
Sodium: 141 mmol/L (ref 134–144)
Total Protein: 6.1 g/dL (ref 6.0–8.5)

## 2019-05-06 LAB — LIPID PANEL W/O CHOL/HDL RATIO
Cholesterol, Total: 183 mg/dL (ref 100–199)
HDL: 63 mg/dL (ref >39)
LDL Chol Calc (NIH): 99 mg/dL (ref 0–99)
Triglycerides: 117 mg/dL (ref 0–149)
VLDL Cholesterol Cal: 21 mg/dL (ref 5–40)

## 2019-05-06 LAB — CBC
Hematocrit: 38.1 % (ref 34.0–46.6)
Hemoglobin: 12.5 g/dL (ref 11.1–15.9)
MCH: 30.8 pg (ref 26.6–33.0)
MCHC: 32.8 g/dL (ref 31.5–35.7)
MCV: 94 fL (ref 79–97)
Platelets: 214 10*3/uL (ref 150–450)
RBC: 4.06 x10E6/uL (ref 3.77–5.28)
RDW: 12.3 % (ref 11.7–15.4)
WBC: 5.7 10*3/uL (ref 3.4–10.8)

## 2019-05-06 LAB — VITAMIN D 25 HYDROXY (VIT D DEFICIENCY, FRACTURES): Vit D, 25-Hydroxy: 47.4 ng/mL (ref 30.0–100.0)

## 2019-05-06 LAB — TSH: TSH: 0.773 u[IU]/mL (ref 0.450–4.500)

## 2019-09-22 ENCOUNTER — Emergency Department (HOSPITAL_COMMUNITY)
Admission: EM | Admit: 2019-09-22 | Discharge: 2019-09-22 | Disposition: A | Discharge status: Home / Self Care | Payer: BC Managed Care – PPO | Attending: Emergency Medicine | Admitting: Emergency Medicine

## 2019-09-22 ENCOUNTER — Other Ambulatory Visit: Payer: Self-pay

## 2019-09-22 ENCOUNTER — Encounter (HOSPITAL_COMMUNITY): Payer: Self-pay

## 2019-09-22 DIAGNOSIS — T50B95A Adverse effect of other viral vaccines, initial encounter: Secondary | ICD-10-CM | POA: Diagnosis not present

## 2019-09-22 DIAGNOSIS — R42 Dizziness and giddiness: Secondary | ICD-10-CM | POA: Insufficient documentation

## 2019-09-22 DIAGNOSIS — R21 Rash and other nonspecific skin eruption: Secondary | ICD-10-CM | POA: Diagnosis not present

## 2019-09-22 DIAGNOSIS — Z87891 Personal history of nicotine dependence: Secondary | ICD-10-CM | POA: Insufficient documentation

## 2019-09-22 DIAGNOSIS — T7840XA Allergy, unspecified, initial encounter: Secondary | ICD-10-CM

## 2019-09-22 MED ORDER — SODIUM CHLORIDE 0.9 % IV BOLUS
1000.0000 mL | Freq: Once | INTRAVENOUS | Status: AC
  Administered 2019-09-22: 1000 mL via INTRAVENOUS

## 2019-09-22 NOTE — ED Notes (Signed)
Patient verbalizes understanding of discharge instructions. Opportunity for questioning and answers were provided. Armband removed by staff, pt discharged from ED ambulatory.   

## 2019-09-22 NOTE — ED Notes (Signed)
Pt ambulated to the bathroom.  

## 2019-09-22 NOTE — ED Notes (Signed)
Patient ambulated to and from bathroom with a steady gait. 

## 2019-09-22 NOTE — Discharge Instructions (Signed)
Take Benadryl as needed for itching or rash Please return if you are worsening

## 2019-09-22 NOTE — ED Provider Notes (Signed)
[facility] EMERGENCY DEPARTMENT Provider Note   CSN: [id number] Arrival date & time: 09/22/19  1607   History Chief Complaint  Patient presents with  . Allergic Reaction    Kristin Bass is a 59 y.o. female who presents with allergic reaction. 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  Patient had an injection in the left arm she ports immediate "funny feeling".  She describes this as feeling lightheaded like she is going to pass out and some tingling in the hand.  Patient is also noted to develop a rash on the chest and abdomen.  She received 50 of IM Benadryl.  She denies shortness of breath or oral swelling, wheezing, abdominal pain, nausea or vomiting.  She had a similar reaction to antibiotics years ago and was treated for anaphylaxis at that time.  Currently she feels fatigued and cold.  HPI     Past Medical History:  Diagnosis Date  . Anxiety   . History of degenerative disc disease    mild  . Infertility, female   . Mild scoliosis   . Myofascial pain syndrome   . Precancerous lesion   . Wrist fracture     Patient Active Problem List   Diagnosis Date Noted  . Pain of right hip joint 12/25/2018  . 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  . 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 Back pain 09/09/2017  . OSA (obstructive sleep apnea) 12/07/2016  . Bilateral temporomandibular joint pain 08/23/2016  . Epidermoid cyst of skin 08/23/2016  . Snoring 08/23/2016  . 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 Sudden hearing loss, left 06/10/2016  . 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  . Radiculopathy of lumbosacral region 01/06/2015    Past Surgical History:  Procedure Laterality Date  . BREAST CYST EXCISION Left   . CYSTECTOMY     from neck  . DILATION AND CURETTAGE OF UTERUS     times 2  . FOOT SURGERY Left 11/11   hammer toe & plantar fascitis  . MOUTH SURGERY     bone graft    . VEIN LIGATION AND STRIPPING       OB History    Gravida  2   Para      Term      Preterm      AB  2   Living  0     SAB      TAB      Ectopic  2   Multiple      Live Births           Obstetric Comments  1 adopted children        Family History  Problem Relation Age of Onset  . Colon cancer Mother   . Hypertension Father   . Anxiety disorder Father   . Diabetes Maternal Grandfather   . Hypertension Maternal Grandmother   . COPD Maternal Aunt   . Stroke Maternal Aunt   . Lung cancer Maternal Aunt     Social History   Tobacco Use  . Smoking status: Former Research scientist (life sciences)  . Smokeless tobacco: Never Used  Substance Use Topics  . Alcohol use: No  . Drug use: No    Home Medications Prior to Admission medications   Medication Sig Start Date End Date Taking? Authorizing Provider  acetaminophen (TYLENOL) 500 MG tablet Take 500 mg by mouth every 6 (six) hours as needed for  headache.    [provider]  Calcium Carbonate-Vitamin D (CALCIUM + D PO) Take 1 tablet by mouth daily.    [provider]  Docusate Calcium (STOOL SOFTENER PO) Apply 1 capsule topically at bedtime.     [provider]  fluticasone (FLONASE) 50 MCG/ACT nasal spray as needed. 03/06/19   [provider]  Multiple Vitamins-Minerals (MULTIVITAMIN PO) Take 1 tablet by mouth daily.     [provider]  Omega-3 Fatty Acids (FISH OIL PO) Take 1 capsule by mouth every evening.     [provider]  Probiotic Product (PROBIOTIC PO) Take by mouth.    [provider]  TURMERIC PO Take 1 tablet by mouth daily.     [provider]  venlafaxine XR (EFFEXOR-XR) 150 MG 24 hr capsule Take 150 mg by mouth daily.  12/28/16   [provider]    Allergies    Ciprofloxacin, Macrobid [nitrofurantoin], and Septra [sulfamethoxazole-trimethoprim]  Review of Systems   Review of Systems  Constitutional: Positive for fatigue.   Respiratory: Negative for shortness of breath and wheezing.   Gastrointestinal: Negative for abdominal pain, nausea and vomiting.  Skin: Positive for rash.  Neurological: Positive for light-headedness.  All other systems reviewed and are negative.   Physical Exam Updated Vital Signs BP 112/75   Pulse 61   Resp 16   LMP 01/18/2012   SpO2 97%   Physical Exam Vitals and nursing note reviewed.  Constitutional:      General: She is not in acute distress.    Appearance: Normal appearance. She is well-developed. She is not ill-appearing.     Comments: Fatigued appearing female in NAD.   HENT:     Head: Normocephalic and atraumatic.  Eyes:     General: No scleral icterus.       Right eye: No discharge.        Left eye: No discharge.     Conjunctiva/sclera: Conjunctivae normal.     Pupils: Pupils are equal, round, and reactive to light.  Cardiovascular:     Rate and Rhythm: Normal rate and regular rhythm.  Pulmonary:     Effort: Pulmonary effort is normal. No respiratory distress.     Breath sounds: Normal breath sounds.  Abdominal:     General: There is no distension.     Palpations: Abdomen is soft.     Tenderness: There is no abdominal tenderness.  Musculoskeletal:     Cervical back: Normal range of motion.  Skin:    General: Skin is warm and dry.     Findings: Rash (splotchy erythema on the anterior chest wall and under the breasts) present.  Neurological:     Mental Status: She is alert and oriented to person, place, and time.  Psychiatric:        Mood and Affect: Mood normal.        Behavior: Behavior normal.     ED Results / Procedures / Treatments   Labs (all labs ordered are listed, but only abnormal results are displayed) Labs Reviewed - No data to display  EKG None  Radiology No results found.  Procedures Procedures (including critical care time)  Medications Ordered in ED Medications - No data to display  ED Course  I have reviewed the triage  vital signs and the nursing notes.  Pertinent labs & imaging results that were available during my care of the patient were reviewed by me and considered in my medical decision making (see chart  for details).  59 year old female presents with allergic reaction vs anaphylaxis from COVID vaccine. Her vitals are normal. She has a splotchy, erythematous rash on the chest wall. She appears fatigued and feels lightheaded. She is not hypotensive, SOB, vomiting. She received 50mg  of Benadryl by EMS. Shared visit with Dr. Ashok Cordia. Will continue to monitor.   Pt has been monitored for ~4 hours. She has ambulated and is requesting d/c. Rash has resolved. She reports she will not receive a 2nd dose of the vaccine which is advisable. She was encouraged to take Benadryl as needed for symptoms and return if worsening.  MDM Rules/Calculators/A&P                       Final Clinical Impression(s) / ED Diagnoses Final diagnoses:  Allergic reaction, initial encounter    Rx / DC Orders ED Discharge Orders    None       Recardo Evangelist, PA-C 09/22/19 1958    Lajean Saver, MD 09/22/19 [phone]

## 2019-09-22 NOTE — ED Triage Notes (Signed)
Pt bib [facility] from vaccine clinic. Pt received initial covid vaccine at 1515 today and experienced a reaction with hives. Denies SOB or anaphylactic symptoms. Pt presents with chills and stating she is cold. Pt received 50mg  IM benadyrl on scene. All EMS VSS.

## 2019-10-09 ENCOUNTER — Encounter: Payer: Self-pay | Admitting: Certified Nurse Midwife

## 2019-10-19 ENCOUNTER — Encounter (HOSPITAL_COMMUNITY): Payer: Self-pay | Admitting: Emergency Medicine

## 2019-10-19 ENCOUNTER — Ambulatory Visit (INDEPENDENT_AMBULATORY_CARE_PROVIDER_SITE_OTHER): Payer: BC Managed Care – PPO | Admitting: Allergy

## 2019-10-19 ENCOUNTER — Encounter: Payer: Self-pay | Admitting: Allergy

## 2019-10-19 ENCOUNTER — Emergency Department (HOSPITAL_COMMUNITY): Payer: BC Managed Care – PPO

## 2019-10-19 ENCOUNTER — Emergency Department (HOSPITAL_COMMUNITY)
Admission: EM | Admit: 2019-10-19 | Discharge: 2019-10-20 | Disposition: A | Discharge status: Home / Self Care | Payer: BC Managed Care – PPO | Attending: Emergency Medicine | Admitting: Emergency Medicine

## 2019-10-19 ENCOUNTER — Other Ambulatory Visit: Payer: Self-pay

## 2019-10-19 VITALS — BP 110/74 | HR 68 | Temp 97.2°F | Resp 18 | Ht 69.0 in | Wt 169.8 lb

## 2019-10-19 DIAGNOSIS — T8052XD Anaphylactic reaction due to vaccination, subsequent encounter: Secondary | ICD-10-CM

## 2019-10-19 DIAGNOSIS — Z79899 Other long term (current) drug therapy: Secondary | ICD-10-CM | POA: Insufficient documentation

## 2019-10-19 DIAGNOSIS — J3089 Other allergic rhinitis: Secondary | ICD-10-CM

## 2019-10-19 DIAGNOSIS — Y999 Unspecified external cause status: Secondary | ICD-10-CM | POA: Diagnosis not present

## 2019-10-19 DIAGNOSIS — H1013 Acute atopic conjunctivitis, bilateral: Secondary | ICD-10-CM

## 2019-10-19 DIAGNOSIS — T3695XA Adverse effect of unspecified systemic antibiotic, initial encounter: Secondary | ICD-10-CM

## 2019-10-19 DIAGNOSIS — M542 Cervicalgia: Secondary | ICD-10-CM | POA: Insufficient documentation

## 2019-10-19 DIAGNOSIS — W19XXXA Unspecified fall, initial encounter: Secondary | ICD-10-CM

## 2019-10-19 DIAGNOSIS — S0181XA Laceration without foreign body of other part of head, initial encounter: Secondary | ICD-10-CM | POA: Insufficient documentation

## 2019-10-19 DIAGNOSIS — Y939 Activity, unspecified: Secondary | ICD-10-CM | POA: Diagnosis not present

## 2019-10-19 DIAGNOSIS — S0990XA Unspecified injury of head, initial encounter: Secondary | ICD-10-CM | POA: Diagnosis present

## 2019-10-19 DIAGNOSIS — W01190A Fall on same level from slipping, tripping and stumbling with subsequent striking against furniture, initial encounter: Secondary | ICD-10-CM | POA: Insufficient documentation

## 2019-10-19 DIAGNOSIS — Z87891 Personal history of nicotine dependence: Secondary | ICD-10-CM | POA: Insufficient documentation

## 2019-10-19 DIAGNOSIS — Y92009 Unspecified place in unspecified non-institutional (private) residence as the place of occurrence of the external cause: Secondary | ICD-10-CM | POA: Insufficient documentation

## 2019-10-19 MED ORDER — IBUPROFEN 400 MG PO TABS
400.0000 mg | ORAL_TABLET | Freq: Once | ORAL | Status: AC | PRN
  Administered 2019-10-19: 400 mg via ORAL

## 2019-10-19 MED ORDER — LORAZEPAM 2 MG/ML IJ SOLN
0.5000 mg | Freq: Once | INTRAMUSCULAR | Status: AC
  Administered 2019-10-20: 0.5 mg via INTRAVENOUS
  Filled 2019-10-19: qty 1

## 2019-10-19 MED ORDER — LIDOCAINE-EPINEPHRINE (PF) 2 %-1:200000 IJ SOLN
20.0000 mL | Freq: Once | INTRAMUSCULAR | Status: AC
  Administered 2019-10-19: 20 mL
  Filled 2019-10-19: qty 20

## 2019-10-19 MED ORDER — LORAZEPAM 2 MG/ML IJ SOLN
0.5000 mg | Freq: Once | INTRAMUSCULAR | Status: AC
  Administered 2019-10-19: 0.5 mg via INTRAVENOUS
  Filled 2019-10-19: qty 1

## 2019-10-19 NOTE — ED Provider Notes (Signed)
[facility] EMERGENCY DEPARTMENT Provider Note   CSN: [id number] Arrival date & time: 10/19/19  1847     History Chief Complaint  Patient presents with  . Fall    Kristin Bass is a 59 y.o. female.  HPI 59 year old female with a history of TMJ, back pain, radiculopathy of lumbosacral region presents to the ER after falling earlier today.  She states she tripped over a rug, and her head hit the corner of her decorative bookshelf.  She denies loss of consciousness, use of blood thinners.  She reports no dizziness, endorses neck stiffness and pain with range of motion.  She states she has a laceration on the front of her forehead and on the side of her face.  Reports no other complaints at this time.  She denies weakness, vision changes, nausea, vomiting, foot drop, low back pain, no dyspagia, jaw pain, otalgia, difficulty speaking or swallowing.     Past Medical History:  Diagnosis Date  . Anxiety   . History of degenerative disc disease    mild  . Infertility, female   . Mild scoliosis   . Myofascial pain syndrome   . Precancerous lesion   . Urticaria   . Wrist fracture     Patient Active Problem List   Diagnosis Date Noted  . Pain of right hip joint 12/25/2018  . 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  . 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 Back pain 09/09/2017  . OSA (obstructive sleep apnea) 12/07/2016  . Bilateral temporomandibular joint pain 08/23/2016  . Epidermoid cyst of skin 08/23/2016  . Snoring 08/23/2016  . 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 Sudden hearing loss, left 06/10/2016  . 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  . Radiculopathy of lumbosacral region 01/06/2015    Past Surgical History:  Procedure Laterality Date  . BREAST CYST EXCISION Left   . CYSTECTOMY     from neck  . DILATION AND CURETTAGE OF UTERUS     times 2  . FOOT SURGERY Left 11/11   hammer toe & plantar fascitis  . MOUTH SURGERY     bone graft   . TONSILLECTOMY    . VEIN LIGATION AND STRIPPING       OB History    Gravida  2   Para      Term      Preterm      AB  2   Living  0     SAB      TAB      Ectopic  2   Multiple      Live Births           Obstetric Comments  1 adopted children        Family History  Problem Relation Age of Onset  . Colon cancer Mother   . Hypertension Father   . Anxiety disorder Father   . Diabetes Maternal Grandfather   . Hypertension Maternal Grandmother   . COPD Maternal Aunt   . Stroke Maternal Aunt   . Lung cancer Maternal Aunt   . Immunodeficiency Sister   . Allergic rhinitis Neg Hx   . Angioedema Neg Hx   . Asthma Neg Hx   . Atopy Neg Hx   . Eczema Neg Hx   . Urticaria Neg Hx     Social History   Tobacco Use  . Smoking status: Former Research scientist (life sciences)  . Smokeless tobacco: Never Used  Substance Use Topics  . Alcohol use: No  .  Drug use: No    Home Medications Prior to Admission medications   Medication Sig Start Date End Date Taking? Authorizing Provider  acetaminophen (TYLENOL) 500 MG tablet Take 500 mg by mouth every 6 (six) hours as needed for headache.   Yes [provider]  Calcium Carbonate-Vitamin D (CALCIUM + D PO) Take 1 tablet by mouth daily.   Yes [provider]  Docusate Calcium (STOOL SOFTENER PO) Apply 3 capsules topically at bedtime.    Yes [provider]  fluticasone (FLONASE) 50 MCG/ACT nasal spray Place 1 spray into both nostrils daily as needed for allergies.  03/06/19  Yes [provider]  ibuprofen (ADVIL) 200 MG tablet Take 200 mg by mouth every 6 (six) hours as needed for headache or moderate pain.   Yes [provider]  Omega-3 Fatty Acids (FISH OIL PO) Take 2 capsules by mouth every evening.    Yes [provider]  Probiotic Product (PROBIOTIC PO) Take 1 capsule by mouth every evening.    Yes [provider]  Triprolidine-Pseudoephedrine (ANTIHISTAMINE PO) Take 1 tablet by  mouth daily as needed (allergies).   Yes [provider]  TURMERIC PO Take 1 tablet by mouth every evening.    Yes [provider]  venlafaxine XR (EFFEXOR-XR) 150 MG 24 hr capsule Take 150 mg by mouth daily.  12/28/16  Yes [provider]    Allergies    Other, Ciprofloxacin, Macrobid [nitrofurantoin], and Septra [sulfamethoxazole-trimethoprim]  Review of Systems   Review of Systems  Constitutional: Negative for chills, fatigue and fever.  HENT: Negative for ear pain, facial swelling, hearing loss, nosebleeds, sinus pressure, sinus pain, tinnitus and trouble swallowing.   Eyes: Negative for pain and visual disturbance.  Respiratory: Negative for chest tightness and shortness of breath.   Cardiovascular: Negative for chest pain.  Gastrointestinal: Negative for abdominal pain, nausea and vomiting.  Musculoskeletal: Positive for neck pain and neck stiffness.  Skin: Positive for wound.  Neurological: Negative for dizziness, speech difficulty, weakness, numbness and headaches.  Psychiatric/Behavioral: Negative for confusion.  All other systems reviewed and are negative.   Physical Exam Updated Vital Signs BP 124/74   Pulse (!) 59   Temp 97.8 F (36.6 C) (Oral)   Resp 18   Ht 5\' 8"  (1.727 m)   Wt 76.7 kg   LMP 01/18/2012   SpO2 98%   BMI 25.70 kg/m   Physical Exam Vitals reviewed.  Constitutional:      Appearance: Normal appearance.  HENT:     Head: Normocephalic and atraumatic.     Comments: No step-off or deformities     Left Ear: External ear normal.     Ears:     Comments: 3 cm superficial laceration above the TMJ joint.  Patient reports no jaw pain, difficulty opening jaw.    Nose: Nose normal.     Mouth/Throat:     Mouth: Mucous membranes are moist.     Pharynx: Oropharynx is clear.  Eyes:     General:        Right eye: No discharge.        Left eye: No discharge.     Extraocular Movements: Extraocular movements intact.      Conjunctiva/sclera: Conjunctivae normal.     Pupils: Pupils are equal, round, and reactive to light.  Cardiovascular:     Rate and Rhythm: Normal rate and regular rhythm.     Pulses: Normal pulses.     Heart sounds: Normal heart  sounds.  Pulmonary:     Effort: Pulmonary effort is normal.     Breath sounds: Normal breath sounds.  Chest:     Chest wall: No tenderness.  Abdominal:     General: Abdomen is flat. There is no distension.     Palpations: Abdomen is soft.     Tenderness: There is no abdominal tenderness.  Musculoskeletal:        General: No swelling. Normal range of motion.     Cervical back: Normal range of motion. Tenderness present.  Skin:    Comments: 4 cm deep laceration above the upper left eyebrow.  No appreciable redness, swelling, fluctuance, signs of a hematoma.  Patient able to move eyebrows freely.Pt also has 4 cm superficial laceration anterior to the right ear.   Neurological:     General: No focal deficit present.     Mental Status: She is alert and oriented to person, place, and time.  Psychiatric:        Mood and Affect: Mood normal.        Behavior: Behavior normal.     ED Results / Procedures / Treatments   Labs (all labs ordered are listed, but only abnormal results are displayed) Labs Reviewed - No data to display  EKG None  Radiology CT Head Wo Contrast  Result Date: 10/19/2019 CLINICAL DATA:  [place] [address] and hit head on bookshelf laceration to left forehead EXAM: CT HEAD WITHOUT CONTRAST CT CERVICAL SPINE WITHOUT CONTRAST TECHNIQUE: Multidetector CT imaging of the head and cervical spine was performed following the standard protocol without intravenous contrast. Multiplanar CT image reconstructions of the cervical spine were also generated. COMPARISON:  CT 09/26/2018 FINDINGS: CT HEAD FINDINGS Brain: No evidence of acute infarction, hemorrhage, hydrocephalus, extra-axial collection or mass lesion/mass effect. Vascular: No hyperdense vessels.  No  unexpected calcification Skull: Normal. Negative for fracture or focal lesion. Sinuses/Orbits: Mild mucosal thickening in the ethmoid sinuses Other: Left forehead scalp laceration CT CERVICAL SPINE FINDINGS Alignment: No subluxation.  Facet alignment is normal. Skull base and vertebrae: No acute fracture. No primary bone lesion or focal pathologic process. Soft tissues and spinal canal: No prevertebral fluid or swelling. No visible canal hematoma. Disc levels:  Mild degenerative changes C5-C6, C6-C7 and C7-T1. Upper chest: Negative. Other: None IMPRESSION: 1. Negative non contrasted CT appearance of the brain. 2. No acute osseous abnormality of the cervical spine. Electronically Signed   By: Donavan Foil M.D.   On: 10/19/2019 20:09   CT Cervical Spine Wo Contrast  Result Date: 10/19/2019 CLINICAL DATA:  [place] [address] and hit head on bookshelf laceration to left forehead EXAM: CT HEAD WITHOUT CONTRAST CT CERVICAL SPINE WITHOUT CONTRAST TECHNIQUE: Multidetector CT imaging of the head and cervical spine was performed following the standard protocol without intravenous contrast. Multiplanar CT image reconstructions of the cervical spine were also generated. COMPARISON:  CT 09/26/2018 FINDINGS: CT HEAD FINDINGS Brain: No evidence of acute infarction, hemorrhage, hydrocephalus, extra-axial collection or mass lesion/mass effect. Vascular: No hyperdense vessels.  No unexpected calcification Skull: Normal. Negative for fracture or focal lesion. Sinuses/Orbits: Mild mucosal thickening in the ethmoid sinuses Other: Left forehead scalp laceration CT CERVICAL SPINE FINDINGS Alignment: No subluxation.  Facet alignment is normal. Skull base and vertebrae: No acute fracture. No primary bone lesion or focal pathologic process. Soft tissues and spinal canal: No prevertebral fluid or swelling. No visible canal hematoma. Disc levels:  Mild degenerative changes C5-C6, C6-C7 and C7-T1. Upper chest: Negative. Other: None IMPRESSION: 1.  Negative non  contrasted CT appearance of the brain. 2. No acute osseous abnormality of the cervical spine. Electronically Signed   By: Donavan Foil M.D.   On: 10/19/2019 20:09    Procedures .Marland KitchenLaceration Repair  Date/Time: 10/19/2019 11:10 PM Performed by: Garald Balding, PA-C Authorized by: Garald Balding, PA-C   Consent:    Consent obtained:  Verbal   Consent given by:  Patient   Risks discussed:  Infection, need for additional repair, pain, poor cosmetic result and poor wound healing   Alternatives discussed:  No treatment and delayed treatment Universal protocol:    Procedure explained and questions answered to patient or proxy's satisfaction: yes     Relevant documents present and verified: yes     Test results available and properly labeled: yes     Imaging studies available: yes     Required blood products, implants, devices, and special equipment available: yes     Site/side marked: yes     Immediately prior to procedure, a time out was called: yes     Patient identity confirmed:  Verbally with patient Anesthesia (see MAR for exact dosages):    Anesthesia method:  Local infiltration   Local anesthetic:  Lidocaine 1% WITH epi Laceration details:    Location: Left upper forehead, above eyebrow and right preauricular area.   Length (cm):  4   Depth (mm):  1 Repair type:    Repair type:  Simple Exploration:    Hemostasis achieved with:  Epinephrine   Wound exploration: wound explored through full range of motion and entire depth of wound probed and visualized     Wound extent: no foreign bodies/material noted and no muscle damage noted     Contaminated: no   Treatment:    Area cleansed with:  Saline   Amount of cleaning:  Standard   Irrigation solution:  Sterile water   Irrigation volume:  20cc   Irrigation method:  Syringe   Visualized foreign bodies/material removed: no   Skin repair:    Repair method:  Sutures and tissue adhesive   Suture material:  Plain gut    Suture technique:  Simple interrupted Post-procedure details:    Dressing:  Non-adherent dressing   Patient tolerance of procedure:  Tolerated well, no immediate complications   (including critical care time)  Medications Ordered in ED Medications  ibuprofen (ADVIL) tablet 400 mg (400 mg Oral Given 10/19/19 2111)  lidocaine-EPINEPHrine (XYLOCAINE W/EPI) 2 %-1:200000 (PF) injection 20 mL (20 mLs Infiltration Given 10/19/19 2327)  LORazepam (ATIVAN) injection 0.5 mg (0.5 mg Intravenous Given 10/19/19 2327)  LORazepam (ATIVAN) injection 0.5 mg (0.5 mg Intravenous Given 10/20/19 0001)    ED Course  I have reviewed the triage vital signs and the nursing notes.  Pertinent labs & imaging results that were available during my care of the patient were reviewed by me and considered in my medical decision making (see chart for details).    MDM Rules/Calculators/A&P                      59 year old female presents for fall Patient well-appearing, no acute distress, though she does have dried blood over the front and side of her face.  Patient denies dizziness, lightheadedness, syncope prior to fall. Vitals nonconcerning. On physical exam, she has some paraspinal tenderness, but full range of motion of her neck and jaw and no midline tenderness. Neuro exam without abnormalities- full strength in upper and lower extremities, PERRLA, sensations intact. CT head  and neck negative for bleed/fracture.  Forehead laceration repaired with no complications, Dermabond applied to small laceration to the lateral right side of her face. Discussed with patient that her sutures will absorb in 5-7 days, after that she can remove the remnants. Discussed possible concussion symptoms and management. OTC anti-inflammatories for muscle soreness. Return precautions given.  At this stage in the ED course I believe the patient has been appropriately screened, doubt intracranial bleed, cervical/scull/orbital fracture, or other traumatic  injury. Pt voices understanding and is agreeable to the plan.    Final Clinical Impression(s) / ED Diagnoses Final diagnoses:  Fall, initial encounter  Forehead laceration, initial encounter  Facial laceration, initial encounter    Rx / DC Orders ED Discharge Orders    None       Lyndel Safe 10/20/19 0059    Merrily Pew, MD 10/20/19 [phone]

## 2019-10-19 NOTE — ED Triage Notes (Signed)
Pt fell at home- tripped over rug and hit head on corner of bookshelf. No LOC, No blood thinners. Per EMS, pt has 1 in lac to left forehead. Pt complains of neck pain, C collar in place. BP 135/84, CBG 123, HR 68, 100% on room air.

## 2019-10-19 NOTE — Progress Notes (Signed)
New Patient Note  RE: Kristin Bass MRN: [id number] DOB: 10-Apr-1961 Date of Office Visit: 10/19/2019  Referring provider: Antony Contras, MD Primary care provider: Antony Contras, MD  Chief Complaint: Allergic reactions  History of present illness: Kristin Bass is a 59 y.o. female presenting today for consultation for allergic reactions to medications and Covid vaccine.  She states she has had 2 bad reactions in her lifetime.  Most recently she had a reaction following her first [place] Covid vaccine injection on 09/22/2018.  She states within 5 minutes of the injection she started feeling dizzy and "out of out" and a "weird feeling in [her] head".  She states she told the paramedic of her symptoms.  She states she recalls slumping in her chair.  She states the dizzy feeling kept worsening.  She also state she was told by the paramedics that she also had hives on her chest.  She states she was told her BP dropped as well.  She denies LOC, respiratory symptoms.  She does states she felt nauseous but did not have any emesis.  She denies any swelling. She was taken by the paramedics via EMS and she states she got a shot of benadryl and was transported to the ED.   Per the ED record she did receive 50 mg of IM Benadryl.  She recalls she was a very cold while she was in the ED.  On exam she was noted to have a blood pressure of 112/75 with normal heart rate, respirations and oxygen saturations.  She was noted to be a" fatigued appearing female in no acute distress" on exam.  On exam she was noted to have a "splotchy erythema on the anterior chest wall and under the breasts".  She was monitored further in the ED for about 4 hours and was able to be discharged.  She was advised for the ED not to receive a second dose of the vaccine.  She states she did not want the vaccine to begin with but her family were all receiving the vaccine.  She is unsure at this time if she wants to have any Covid component vaccine  testing or if she wants to receive any future Covid vaccine doses. She otherwise states that she has not had any previous reactions or any issues with vaccines in the past.  She states she has had colonoscopies thus far as her parent has a history of colon cancer.  She states she has tolerated bowel prep without any issue.  She has no history of any dermal fillers.  Per review of her records from her PCP she has been vaccinated with Zostavax, DTaP, fluzones, Shingrix.  She states at least 10 years ago while at the beach and she notes the weather was super hot.  She took macrobid for UTI and states she didn't see any change in her symptoms thus was changed to a different sulfa drug (Septra) or it could have been Cipro --she is not sure which antibiotic she was provided secondarily for this UTI.  However after taking this medication she states she had a reaction.  She states around the time of reaction she was shopping and trying on bathing suits.  She state she started itching all over and then she started getting dizzy and feeling light headed.  She states she somehow made it out of the dressing room out to the jury area and then recalls being on the floor.  She was taken to the ED. Since then  she has not had any Cipro, Septra or Macrobid.  She does report having allergies.  She states she takes a Hydrographic surveyor allergy relief pill daily and she has used Pataday as well as Flonase to help with allergy symptom control in the past.  She does report having symptoms of itchy/watery eyes, sneezing, nasal congestion and drainage.  She has seen Dr. Neldon Mc previously around 2013 for her history of allergic rhinitis.  She states she rarely eats red meat in her diet.  Review of systems: Review of Systems  Constitutional: Positive for chills.  HENT: Positive for congestion.   Eyes: Negative.   Respiratory: Negative.   Cardiovascular: Negative.   Gastrointestinal: Positive for nausea.  Musculoskeletal:  Negative.   Skin: Positive for itching and rash.  Neurological: Positive for dizziness.    All other systems negative unless noted above in HPI  Past medical history: Past Medical History:  Diagnosis Date  . Anxiety   . History of degenerative disc disease    mild  . Infertility, female   . Mild scoliosis   . Myofascial pain syndrome   . Precancerous lesion   . Urticaria   . Wrist fracture     Past surgical history: Past Surgical History:  Procedure Laterality Date  . BREAST CYST EXCISION Left   . CYSTECTOMY     from neck  . DILATION AND CURETTAGE OF UTERUS     times 2  . FOOT SURGERY Left 11/11   hammer toe & plantar fascitis  . MOUTH SURGERY     bone graft  . TONSILLECTOMY    . VEIN LIGATION AND STRIPPING      Family history:  Family History  Problem Relation Age of Onset  . Colon cancer Mother   . Hypertension Father   . Anxiety disorder Father   . Diabetes Maternal Grandfather   . Hypertension Maternal Grandmother   . COPD Maternal Aunt   . Stroke Maternal Aunt   . Lung cancer Maternal Aunt   . Immunodeficiency Sister   . Allergic rhinitis Neg Hx   . Angioedema Neg Hx   . Asthma Neg Hx   . Atopy Neg Hx   . Eczema Neg Hx   . Urticaria Neg Hx     Social history: She lives in a home without carpeting with electric heating and central cooling.  She is unsure if there is any issues with water damage or mildew in the home.  No concern for roaches in the home.  There is a dog that goes in and out of the house.  She is a TA in an adaptive classroom.  She denies a smoking history.  Medication List: Current Outpatient Medications  Medication Sig Dispense Refill  . acetaminophen (TYLENOL) 500 MG tablet Take 500 mg by mouth every 6 (six) hours as needed for headache.    . Calcium Carbonate-Vitamin D (CALCIUM + D PO) Take 1 tablet by mouth daily.    Mariane Baumgarten Calcium (STOOL SOFTENER PO) Apply 3 capsules topically at bedtime.     . fluticasone (FLONASE) 50  MCG/ACT nasal spray Place 1 spray into both nostrils daily as needed for allergies.     . Omega-3 Fatty Acids (FISH OIL PO) Take 2 capsules by mouth every evening.     . Probiotic Product (PROBIOTIC PO) Take 1 capsule by mouth every evening.     . TURMERIC PO Take 1 tablet by mouth every evening.     . venlafaxine XR (EFFEXOR-XR) 150  MG 24 hr capsule Take 150 mg by mouth daily.   0  . ibuprofen (ADVIL) 200 MG tablet Take 200 mg by mouth every 6 (six) hours as needed for headache or moderate pain.    . Triprolidine-Pseudoephedrine (ANTIHISTAMINE PO) Take 1 tablet by mouth daily as needed (allergies).     No current facility-administered medications for this visit.    Known medication allergies: Allergies  Allergen Reactions  . Other Anaphylaxis and Hives    Covid Vaccine Therapist, music) caused Low BP  . Ciprofloxacin Other (See Comments)    Hypotension; taken at the same time as macrobid so pt is unaware which medication caused her reaction.   Santiago Bur [Nitrofurantoin] Other (See Comments)    Hypotension; taken at the same time as cipro so pt is unaware which medication caused her reaction.  Sarina Ill [Sulfamethoxazole-Trimethoprim]      Physical examination: Blood pressure 110/74, pulse 68, temperature (!) 97.2 F (36.2 C), temperature source Temporal, resp. rate 18, height 5\' 9"  (1.753 m), weight 169 lb 12.8 oz (77 kg), last menstrual period 01/18/2012, SpO2 99 %.  General: Alert, interactive, in no acute distress. HEENT: PERRLA, TMs pearly gray, turbinates non-edematous without discharge, post-pharynx non erythematous. Neck: Supple without lymphadenopathy. Lungs: Clear to auscultation without wheezing, rhonchi or rales. {no increased work of breathing. CV: Normal S1, S2 without murmurs. Abdomen: Nondistended, nontender. Skin: Warm and dry, without lesions or rashes. Extremities:  No clubbing, cyanosis or edema. Neuro:   Grossly intact.  Diagnositics/Labs: Covid vaccine component  testing discussed today including the procedure and shared decision making resulted in will defer until she decides if she would like to have any future vaccine doses.  Assessment and plan:   Allergic reaction secondary to medication/vaccine   -She has had 2 lifetime allergic reactions first after use of antibiotics used to treat UTI and most recently following the [place] Covid vaccine first dose.  -I discussed with her that her symptoms following the first Pfizer vaccine dose are significant enough that I would not recommend that she receive the second dose.  I also discussed that since the [place] vaccine is very similar to [employer] vaccine also did not feel that this would be an appropriate option for her as well at this time.  We did discuss the [employer] vaccine that is now available and if she would like to become fully vaccinated and to potentially be able to receive booster doses in the future then the [employer] may be a good option for her.  I reviewed with her that [employer] vaccine is a virus vector based vaccine and is similar to other vaccines that she has received in the past without any issues.  We did discuss however that given her reaction that I would recommend that she have Covid vaccine component testing done that we do offer in our clinic prior to receiving any future Covid vaccines.  After discussing the procedure for the Covid vaccine component testing she felt that she wanted to wait on this until she decided if she wanted to proceed with any type of further Covid vaccine at this time. - I am not sure at this time what she potentially reacted to in [employer] vaccine as she has not had any previous vaccine reactions nor has she had any issues with polyethylene glycol products in the past.  It is likely that there is some other preservative or other ingredient in [employer] vaccine that she reacted  to that we at this time did not have any testing for.  -We  also discussed today that since she has had the first dose of the Pfizer vaccine that she does have a significant amount of protection at this time.   -In regards to her reaction following antibiotic use I discussed with her that time is in her favor.  For a lot of antibiotic reactions specifically penicillins once greater than 10 years have passed since the reaction is less likely to still remain allergic or sensitized. -Since she is not exactly sure what medications she took at that time it is difficult to recommend what we potentially could do in regards to determining if she is not allergic anymore.  We did discuss the option of doing oral graded challenge in the office however would need to know what she reacted to.  She states that she should be able to contact medical records for the hospital she was treated at to be able to determine what medication she indeed was taking at that time.  I have advised until we know what medication she was taking that she should continue to avoid the current medications that she was has an allergy including the ciprofloxacin, Septra and Macrobid.  -I have ordered a tryptase level to see if she potentially may have mast cell activation I could give [place] another reason as to her 2 previous reactions.  Allergic rhinitis with conjunctivitis  -I have ordered an allergen environmental panel  -She will continue her daily antihistamine.   -She will continue to have access to as needed Pataday and Flonase for ocular and nasal symptom control respectively  Follow-up in 1 year or sooner if needed  I appreciate the opportunity to take part in Milina's care. Please do not hesitate to contact me with questions.  Sincerely,   Prudy Feeler, MD Allergy/Immunology [facility]

## 2019-10-19 NOTE — Discharge Instructions (Addendum)
You were seen in the ER after a fall. Your CT scan of your head and neck did not show any fractures or bleeds. Your lacerations were repaired with suturing and glue. The sutures are self-absorbable, they should dissolve on their own in 5-7 days. If they do not dissolve completely, you can remove them with tweezers after 5-7 days. Please see your return to the ER if your there is any swelling, redness, drainage from the wound (signs of infections) or you develop fevers. Take over the counter Iburprofen or Tylenol for muscle aches after the fall.

## 2019-10-20 DIAGNOSIS — S0181XA Laceration without foreign body of other part of head, initial encounter: Secondary | ICD-10-CM | POA: Diagnosis not present

## 2019-10-20 NOTE — Patient Instructions (Addendum)
Allergic reaction secondary to medication/vaccine   -She has had 2 lifetime allergic reactions first after use of antibiotics used to treat UTI and most recently following the [place] Covid vaccine first dose.  -I discussed with her that her symptoms following the first Pfizer vaccine dose are significant enough that I would not recommend that she receive the second dose.  I also discussed that since the [place] vaccine is very similar to [employer] vaccine also did not feel that this would be an appropriate option for her as well at this time.  We did discuss the [employer] vaccine that is now available and if she would like to become fully vaccinated and to potentially be able to receive booster doses in the future then the [employer] may be a good option for her.  I reviewed with her that [employer] vaccine is a virus vector based vaccine and is similar to other vaccines that she has received in the past without any issues.  We did discuss however that given her reaction that I would recommend that she have Covid vaccine component testing done that we do offer in our clinic prior to receiving any future Covid vaccines.  After discussing the procedure for the Covid vaccine component testing she felt that she wanted to wait on this until she decided if she wanted to proceed with any type of further Covid vaccine at this time. - I am not sure at this time what she potentially reacted to in [employer] vaccine as she has not had any previous vaccine reactions nor has she had any issues with polyethylene glycol products in the past.  It is likely that there is some other preservative or other ingredient in the [place] vaccine that she reacted to that we at this time did not have any testing for.  -We also discussed today that since she has had the first dose of the Pfizer vaccine that she does have a significant amount of protection at this time.   -In regards to her reaction following  antibiotic use I discussed with her that time is in her favor.  For a lot of antibiotic reactions specifically penicillins once greater than 10 years have passed since the reaction is less likely to still remain allergic or sensitized. -Since she is not exactly sure what medications she took at that time it is difficult to recommend what we potentially could do in regards to determining if she is not allergic anymore.  We did discuss the option of doing oral graded challenge in the office however would need to know what she reacted to.  She states that she should be able to contact medical records for the hospital she was treated at to be able to determine what medication she indeed was taking at that time.  I have advised until we know what medication she was taking that she should continue to avoid the current medications that she was has an allergy including the ciprofloxacin, Septra and Macrobid.  -I have ordered a tryptase level to see if she potentially may have mast cell activation I could give [place] another reason as to her 2 previous reactions.  Allergic rhinitis with conjunctivitis  -I have ordered an allergen environmental panel  -She will continue her daily antihistamine.   -She will continue to have access to as needed Pataday and Flonase for ocular and nasal symptom control respectively  Follow-up in 1 year or sooner if needed

## 2019-10-21 LAB — IGE+ALLERGENS ZONE 2(30)

## 2019-10-21 LAB — TRYPTASE: Tryptase: 17.6 ug/L — ABNORMAL HIGH (ref 2.2–13.2)

## 2019-10-26 ENCOUNTER — Telehealth: Payer: Self-pay | Admitting: Allergy

## 2019-10-26 NOTE — Telephone Encounter (Signed)
Patient called and asked if her mother could talk to Dr. Nelva Bush directly. Patient states that her mother could answer questions that were asked in office that she could not answer. Patient is regarding to history questions as well as some other questions.  Please advise.

## 2019-10-26 NOTE — Addendum Note (Signed)
Addended by: Chip Boer R on: 10/26/2019 04:05 PM   Modules accepted: Orders

## 2019-10-26 NOTE — Telephone Encounter (Signed)
I do not necessarily think we need to have a televisit.  If her mother knows the medications that were of concern back when she was [place] the beach shopping that would be very helpful.  I believe that is the biggest question I have from the initial visit. If her mother has additional information she would like to give [place] than they can always make a MyChart message and send it that way.

## 2019-10-26 NOTE — Telephone Encounter (Signed)
Dr. Nelva Bush please advise, would you like for the patient to be scheduled for a televisit?

## 2019-10-27 NOTE — Telephone Encounter (Signed)
Patient called and would like to know what her levels were elevated to. Also, patient wants a copy of her records when we receive them from [place] [facility].

## 2019-10-27 NOTE — Telephone Encounter (Signed)
Called and left a message for patient to call the office in regards to this matter. 

## 2019-10-31 NOTE — Telephone Encounter (Signed)
Spoke with patient and informed her of Dr. Jeralyn Ruths recommendation. Patient stated that her mom is unsure of the medication however [place] and they are going to send over records. Patient stated her mother has information in regards to what she has experienced with what is going on. I informed patient that at this time Dr. Nelva Bush is wanting to repeat labs in 4 weeks to see what they are. I informed patient that after she gets her labs repeated then we could set her up with a televisit or mychart visit if needed. Patient verbalized understanding.

## 2019-11-03 ENCOUNTER — Telehealth: Payer: Self-pay

## 2019-11-03 NOTE — Telephone Encounter (Signed)
Received a call from [facility] [phone] received our medical release form for medical records to have records sent to Dr. Nelva Bush [place] office. [facility] stated not their pt. Probably need to send medical release form to [facility]. Fax # [phone]. Called [place] in the [place] and she will  Fax the medical release form to the [facility].

## 2019-11-14 ENCOUNTER — Telehealth: Payer: Self-pay | Admitting: Allergy

## 2019-11-14 NOTE — Telephone Encounter (Signed)
Kristin Bass have we received any information from the hospital for the patient? Was reviewing previous telephone encounter from [facility] ([place]).

## 2019-11-14 NOTE — Telephone Encounter (Signed)
Patient called back and Kristin Bass advised that records are up front for the patient. Patient verbalized understanding.

## 2019-11-14 NOTE — Telephone Encounter (Signed)
Patient called to see if we have received her records from the hospital and if we have she would like to get a copy for herself. [phone].

## 2019-11-14 NOTE — Telephone Encounter (Signed)
Kristin Bass was able to locate the records that were faxed over from the pharmacy. She made copies and placed the copies up front for the patient to pick up. Called the patient and left a voicemail for her to return call to inform.

## 2019-11-18 LAB — TRYPTASE: Tryptase: 18.2 ug/L — ABNORMAL HIGH (ref 2.2–13.2)

## 2019-11-21 ENCOUNTER — Telehealth: Payer: Self-pay

## 2019-11-21 DIAGNOSIS — R748 Abnormal levels of other serum enzymes: Secondary | ICD-10-CM

## 2019-11-21 NOTE — Telephone Encounter (Signed)
Patient received her lab results via [place] for the 4/29 labs. Patient would like to know what these results mean. She also was wondering about a testing that Dr Nelva Bush had mentioned that would take a couple of hours.  I asked the patient was it a challenge but she was unsure.   Thanks

## 2019-11-21 NOTE — Telephone Encounter (Signed)
Called and spoke with patient and she verbalized understanding. She will stop by this evening to pick up a 24 hour urine jug.

## 2019-11-21 NOTE — Telephone Encounter (Signed)
Called and left a voicemail for the patient asking for her to return call to discuss.

## 2019-11-21 NOTE — Telephone Encounter (Signed)
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  This could indicate that she may have an mast cell (allergy cell) activation issue that could predispose her to having recurrent and random type allergic reactions.  When the tryptase levels are elevated on more than one occasion when she is not in the midst of an allergic reaction then next step is to obtain 24hr urine studies which I will order.   Next step after urine studies is a hematology referral for elevated tryptase level.   We did discuss option of performing drug challenge in office however would like to get urine studies prior to doing any challenges.

## 2019-12-04 LAB — N-METHYLHISTAMINE, 24 HR, U
Collection Duration: 24 h
Creatinine, 24 Hour, U: 1350 mg/24 h (ref 603–1783)
N-Methylhistamine, 24 Hr, U: 107 mcg/g Cr (ref 30–200)
Urine Volume: 4500 mL

## 2019-12-07 ENCOUNTER — Ambulatory Visit (INDEPENDENT_AMBULATORY_CARE_PROVIDER_SITE_OTHER): Payer: BC Managed Care – PPO | Admitting: Allergy

## 2019-12-07 ENCOUNTER — Other Ambulatory Visit: Payer: Self-pay

## 2019-12-07 ENCOUNTER — Encounter: Payer: Self-pay | Admitting: Allergy

## 2019-12-07 DIAGNOSIS — J310 Chronic rhinitis: Secondary | ICD-10-CM | POA: Diagnosis not present

## 2019-12-07 DIAGNOSIS — T8052XD Anaphylactic reaction due to vaccination, subsequent encounter: Secondary | ICD-10-CM

## 2019-12-07 DIAGNOSIS — R748 Abnormal levels of other serum enzymes: Secondary | ICD-10-CM

## 2019-12-07 DIAGNOSIS — H109 Unspecified conjunctivitis: Secondary | ICD-10-CM

## 2019-12-07 DIAGNOSIS — T3695XA Adverse effect of unspecified systemic antibiotic, initial encounter: Secondary | ICD-10-CM

## 2019-12-07 MED ORDER — EPINEPHRINE 0.3 MG/0.3ML IJ SOAJ: 0.3000 mg | Freq: Once | INTRAMUSCULAR | 1 refills | Status: AC

## 2019-12-07 NOTE — Progress Notes (Signed)
RE: Dorla Maki MRN: [id number] DOB: 1960/08/05 Date of Telemedicine Visit: 12/07/2019  Referring provider: Antony Contras, MD Primary care provider: Antony Contras, MD  Chief Complaint: elevated tryptase   Telemedicine Follow Up Visit via Telephone: I connected with Kinsely Lafrenier for a follow up on 12/10/19 by telephone and verified that I am speaking with the correct person using two identifiers.   I discussed the limitations, risks, security and privacy concerns of performing an evaluation and management service by telephone and the availability of in person appointments. I also discussed with the patient that there may be a patient responsible charge related to this service. The patient expressed understanding and agreed to proceed.  Patient is at home accompanied by mother who provided/contributed to the history.  Provider is at the office.  Visit start time: [id number] Visit end time: [place] consent/check in by: Kirke Corin Medical consent and medical assistant/nurse: Garlon Hatchet g  History of Present Illness: She is a 59 y.o. female, who is being followed for allergic reactions and rhinitis with conjunctivitis. Her previous allergy office visit was on 10/19/2019 with Dr. Nelva Bush.   Patient wanted to have this televisit today in order to have her mother assist with her history.  At her initial visit we discussed her of allergic reactions that were appearing to be secondary to medications.  The most recent reaction was after receiving the first dose of Pfizer vaccine.  At that time I discussed that I would recommend doing the Covid component testing however she was not ready to perform this.  The Covid component testing was discussed in more detail today and is still recommended for her if she would like to receive further vaccine. Her mother did report that as a child she would have "fainting spells" that she states the doctors did not address and there for no diagnosis or reasoning for these  fainting spells was ever found.  Her mother also mentioned that she has another daughter that has a primary immunodeficiency and is on immunoglobulin replacement therapy.  She states that his daughter has a genetic disorder with a known gene defect.  She states that [employer] never required testing as a child.  Her mother also reports that she herself has had anaphylaxis to penicillin and vancomycin in the past. Further discussion surrounding the possibility of oral drug challenges to the antibiotics that she had her initial reaction with.  After her last visit we did obtain some lab work that showed she had a elevated tryptase.  That was repeated again and still remains elevated.  24-hour urine histamine study was performed that was normal.  We also did environmental allergy panel that was negative.  Assessment and Plan: Alaira is a 59 y.o. 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  -I discussed with her that her symptoms following the first Pfizer vaccine dose are significant enough that I would not recommend that she receive the second dose.  I also discussed that since the [place] vaccine is very similar to [employer] vaccine also did not feel that this would be an appropriate option for her as well at this time.  We have discussed the [employer] vaccine that is available and if she would like to become fully vaccinated and to potentially be able to receive booster doses in the future then  the [employer] may be a good option for her.  I have reviewed with her that [employer] vaccine is a virus vector based vaccine and is similar to other vaccines that she has received in the past without any issues.    - We did discuss however that given her reaction that I would recommend that she have Covid vaccine  component testing done prior to receiving any future Covid vaccines.  After discussing the procedure for the Covid vaccine component testing again today she would like to go ahead and proceed.  Advise she can schedule an appointment for this. - I am not sure at this time what she potentially reacted to in [employer] vaccine as she has not had any previous vaccine reactions nor has she had any issues with polyethylene glycol products in the past.  It is likely that there is some other preservative or other ingredient in [employer] vaccine that she reacted to that we at this time did not have any testing for.   -In regards to her reaction related to antibiotic use we have discussed with her that time is in her favor.   - We did discuss the option of doing oral graded challenge in the office.   - she should continue to avoid the current medications that she was has an allergy including the ciprofloxacin, Septra and Macrobid.  -She has had 2 elevated tryptase levels now however they are not greater than 20.  Her urine histamine 24-hour collection was normal.  It is possible that she may have a mast cell activating issue that is predisposing her to allergic reactions.  We will have her have access to an epinephrine device.  She should continue her daily antihistamine.  Will consider heme-onc referral for further evaluation.  Nonallergic rhinitis with conjunctivitis  -Environmental allergy panel is negative  - continue her daily antihistamine.   -continue to have access to as needed Pataday and Flonase for ocular and nasal symptom control respectively  Follow-up Covid component vaccine testing    Diagnostics: None.  Medication List:  Current Outpatient Medications  Medication Sig Dispense Refill  . acetaminophen (TYLENOL) 500 MG tablet Take 500 mg by mouth every 6 (six) hours as needed for headache.    . Calcium Carbonate-Vitamin D (CALCIUM + D PO) Take 1 tablet by mouth daily.    Mariane Baumgarten  Calcium (STOOL SOFTENER PO) Apply 3 capsules topically at bedtime.     . fluticasone (FLONASE) 50 MCG/ACT nasal spray Place 1 spray into both nostrils daily as needed for allergies.     Marland Kitchen ibuprofen (ADVIL) 200 MG tablet Take 200 mg by mouth every 6 (six) hours as needed for headache or moderate pain.    . Omega-3 Fatty Acids (FISH OIL PO) Take 2 capsules by mouth every evening.     . Probiotic Product (PROBIOTIC PO) Take 1 capsule by mouth every evening.     . Triprolidine-Pseudoephedrine (ANTIHISTAMINE PO) Take 1 tablet by mouth daily as needed (allergies).    . TURMERIC PO Take 1 tablet by mouth every evening.     . venlafaxine XR (EFFEXOR-XR) 150 MG 24 hr capsule Take 150 mg by mouth daily.   0   No current facility-administered medications for this visit.   Allergies: Allergies  Allergen Reactions  . Other Anaphylaxis and Hives    Covid Vaccine Therapist, music) caused Low BP  . Ciprofloxacin Other (See Comments)    Hypotension; taken at the same time as  macrobid so pt is unaware which medication caused her reaction.   Santiago Bur [Nitrofurantoin] Other (See Comments)    Hypotension; taken at the same time as cipro so pt is unaware which medication caused her reaction.  Sarina Ill [Sulfamethoxazole-Trimethoprim]    I reviewed her past medical history, social history, family history, and environmental history and no significant changes have been reported from previous visit on 10/19/2019.  Review of Systems  Constitutional: Negative.   HENT: Negative.   Eyes: Negative.   Respiratory: Negative.   Cardiovascular: Negative.   Gastrointestinal: Negative.   Musculoskeletal: Negative.   Skin: Negative.   Neurological: Negative.    Objective: Physical Exam Not obtained as encounter was done via telephone.   Previous notes and tests were reviewed.  I discussed the assessment and treatment plan with the patient. The patient was provided an opportunity to ask questions and all were answered. The  patient agreed with the plan and demonstrated an understanding of the instructions.   The patient was advised to call back or seek an in-person evaluation if the symptoms worsen or if the condition fails to improve as anticipated.  I provided 28 minutes of non-face-to-face time during this encounter.  It was my pleasure to participate in Benton Cisek's care today. Please feel free to contact me with any questions or concerns.   Sincerely,  Melynda Krzywicki Charmian Muff, MD

## 2019-12-08 ENCOUNTER — Telehealth: Payer: Self-pay | Admitting: Allergy

## 2019-12-08 NOTE — Telephone Encounter (Signed)
Kristin Bass

## 2019-12-08 NOTE — Telephone Encounter (Signed)
Dr Padgett please advise 

## 2019-12-08 NOTE — Telephone Encounter (Signed)
Patient called wanting to schedule COVID component testing. Please advise if this is approved and if it needs to be completed in a certain time frame.  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 Summer school ends the end of July and regular school starts 8/9.

## 2019-12-08 NOTE — Telephone Encounter (Signed)
Yes I had a televisit with her yesterday and advised she go ahead and schedule component testing.

## 2019-12-08 NOTE — Telephone Encounter (Signed)
Called patient to schedule component testing, unable to leave message- mailbox full.

## 2019-12-10 NOTE — Patient Instructions (Addendum)
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  -I discussed with her that her symptoms following the first Pfizer vaccine dose are significant enough that I would not recommend that she receive the second dose.  I also discussed that since the [place] vaccine is very similar to [employer] vaccine also did not feel that this would be an appropriate option for her as well at this time.  We have discussed the [employer] vaccine that is available and if she would like to become fully vaccinated and to potentially be able to receive booster doses in the future then the [employer] may be a good option for her.  I have reviewed with her that [employer] vaccine is a virus vector based vaccine and is similar to other vaccines that she has received in the past without any issues.    - We did discuss however that given her reaction that I would recommend that she have Covid vaccine component testing done prior to receiving any future Covid vaccines.  After discussing the procedure for the Covid vaccine component testing again today she would like to go ahead and proceed.  Advise she can schedule an appointment for this. - I am not sure at this time what she potentially reacted to in [employer] vaccine as she has not had any previous vaccine reactions nor has she had any issues with polyethylene glycol products in the past.  It is likely that there is some other preservative or other ingredient in [employer] vaccine that she reacted to that we at this time did not have any testing for.   -In regards to her reaction related to antibiotic use we have discussed with her that time is in her favor.   - We did discuss the option of doing oral graded challenge in the office.   - she should continue to avoid the current medications that she was  has an allergy including the ciprofloxacin, Septra and Macrobid.  -She has had 2 elevated tryptase levels now however they are not greater than 20.  Her urine histamine 24-hour collection was normal.  It is possible that she may have a mast cell activating issue that is predisposing her to allergic reactions.  We will have her have access to an epinephrine device.  She should continue her daily antihistamine.  Will consider heme-onc referral for further evaluation.  Rhinoconjunctivitis  -Environmental allergy panel is negative  - continue her daily antihistamine.   -continue to have access to as needed Pataday and Flonase for ocular and nasal symptom control respectively  Follow-up Covid component vaccine testing

## 2019-12-11 ENCOUNTER — Encounter: Payer: BC Managed Care – PPO | Admitting: Licensed Clinical Social Worker

## 2019-12-12 ENCOUNTER — Other Ambulatory Visit: Payer: Self-pay | Admitting: *Deleted

## 2019-12-12 MED ORDER — EPINEPHRINE 0.3 MG/0.3ML IJ SOAJ: 0.3000 mg | Freq: Once | INTRAMUSCULAR | 1 refills | Status: AC

## 2020-02-02 ENCOUNTER — Other Ambulatory Visit: Payer: Self-pay

## 2020-02-02 ENCOUNTER — Encounter: Payer: Self-pay | Admitting: Allergy

## 2020-02-02 ENCOUNTER — Ambulatory Visit (INDEPENDENT_AMBULATORY_CARE_PROVIDER_SITE_OTHER): Payer: BC Managed Care – PPO | Admitting: Allergy

## 2020-02-02 VITALS — BP 104/80 | HR 71 | Resp 18 | Ht 68.0 in

## 2020-02-02 DIAGNOSIS — T8052XD Anaphylactic reaction due to vaccination, subsequent encounter: Secondary | ICD-10-CM

## 2020-02-02 MED ORDER — CETIRIZINE HCL 10 MG PO TABS: 10.0000 mg | ORAL_TABLET | Freq: Every day | ORAL | 5 refills | Status: DC

## 2020-02-02 NOTE — Progress Notes (Signed)
Follow-up Note  RE: Kristin Bass MRN: [id number] DOB: 1961/07/17 Date of Office Visit: 02/02/2020   History of present illness: Kristin Bass is a 59 y.o. female presenting today for Covid vaccine component testing.  Her last visit was a telemedicine visit from 12/07/19 by myself.   Today she is anxious about component testing but overall in her normal state of health.  She has held her zyrtec for testing today.  She would like refill of her zyrtec.    Taken from initial visit on 10/19/19: Following her first [place] Covid vaccine injection on 09/22/2018.  She states within 5 minutes of the injection she started feeling dizzy and "out of out" and a "weird feeling in [her] head".  She states she told the paramedic of her symptoms.  She states she recalls slumping in her chair.  She states the dizzy feeling kept worsening.  She also state she was told by the paramedics that she also had hives on her chest.  She states she was told her BP dropped as well.  She denies LOC, respiratory symptoms.  She does states she felt nauseous but did not have any emesis.  She denies any swelling. She was taken by the paramedics via EMS and she states she got a shot of benadryl and was transported to the ED.   Per the ED record she did receive 50 mg of IM Benadryl.  She recalls she was a very cold while she was in the ED.  On exam she was noted to have a blood pressure of 112/75 with normal heart rate, respirations and oxygen saturations.  She was noted to be a" fatigued appearing female in no acute distress" on exam.  On exam she was noted to have a "splotchy erythema on the anterior chest wall and under the breasts".  She was monitored further in the ED for about 4 hours and was able to be discharged.  She was advised for the ED not to receive a second dose of the vaccine. She otherwise states that she has not had any previous reactions or any issues with vaccines in the past.  She states she has had colonoscopies thus  far as her parent has a history of colon cancer.  She states she has tolerated bowel prep without any issue.  She has no history of any dermal fillers.  Per review of her records from her PCP she has been vaccinated with Zostavax, DTaP, fluzones, Shingrix.  Review of systems: Review of Systems  Constitutional: Negative.   HENT: Negative.   Eyes: Negative.   Respiratory: Negative.   Cardiovascular: Negative.   Gastrointestinal: Negative.   Musculoskeletal: Negative.   Skin: Negative.   Neurological: Negative.     All other systems negative unless noted above in HPI  Past medical/social/surgical/family history have been reviewed and are unchanged unless specifically indicated below.  No changes  Medication List: Current Outpatient Medications  Medication Sig Dispense Refill  . acetaminophen (TYLENOL) 500 MG tablet Take 500 mg by mouth every 6 (six) hours as needed for headache.    . Calcium Carbonate-Vitamin D (CALCIUM + D PO) Take 1 tablet by mouth daily.    . cetirizine (ZYRTEC) 10 MG tablet Take 10 mg by mouth daily.    Mariane Baumgarten Calcium (STOOL SOFTENER PO) Apply 3 capsules topically at bedtime.     Marland Kitchen EPINEPHrine 0.3 mg/0.3 mL IJ SOAJ injection Inject into the muscle.    . fluticasone (FLONASE) 50 MCG/ACT nasal spray Place  1 spray into both nostrils daily as needed for allergies.     Marland Kitchen ibuprofen (ADVIL) 200 MG tablet Take 200 mg by mouth every 6 (six) hours as needed for headache or moderate pain.    . Omega-3 Fatty Acids (FISH OIL PO) Take 2 capsules by mouth every evening.     . Probiotic Product (PROBIOTIC PO) Take 1 capsule by mouth every evening.     . TURMERIC PO Take 1 tablet by mouth every evening.     . venlafaxine XR (EFFEXOR-XR) 150 MG 24 hr capsule Take 150 mg by mouth daily.   0   No current facility-administered medications for this visit.     Known medication allergies: Allergies  Allergen Reactions  . Other Anaphylaxis and Hives    Covid Vaccine Therapist, music)  caused Low BP  . Ciprofloxacin Other (See Comments)    Hypotension; taken at the same time as macrobid so pt is unaware which medication caused her reaction.   Santiago Bur [Nitrofurantoin] Other (See Comments)    Hypotension; taken at the same time as cipro so pt is unaware which medication caused her reaction.  Sarina Ill [Sulfamethoxazole-Trimethoprim]      Physical examination: Blood pressure 104/80, pulse 71, resp. rate 18, height 5\' 8"  (1.727 m), last menstrual period 01/18/2012, SpO2 95 %.  General: Alert, interactive, in no acute distress. HEENT: PERRLA, TMs pearly gray, turbinates non-edematous without discharge, post-pharynx non erythematous. Neck: Supple without lymphadenopathy. Lungs: Clear to auscultation without wheezing, rhonchi or rales. {no increased work of breathing. CV: Normal S1, S2 without murmurs. Abdomen: Nondistended, nontender. Skin: Warm and dry, without lesions or rashes. Extremities:  No clubbing, cyanosis or edema. Neuro:   Grossly intact.  Diagnositics/Labs: Allergy testing: Covid vaccine component testing   Skin prick testing- Histamine (1.8 mg/mL) puncture:  positive  Control (negative - HSA) puncture:  negative  Triamcinolone puncture: negative  Methylprednisolone puncture: negative  Miralax puncture (1:100 or 1.7 mg/mL): negative  Miralax puncture (1:10 or 17 mg/mL): negative  Miralax puncture (1:1 or 170 mg/mL): negative    Intradermal testing- Control (negative - HSA) intradermal: negative  Triamcinolone (1:100): negative  Methyprednisolone (1:100): negative  Triamcinolone (1:10): negative  Methylprednisolone (1:10): negative  Triamcinolone (1:1):  +/- erythematous wheal      Allergy testing results were read and interpreted by provider, documented by clinical staff.      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     Today's skin prick testing was negative to Miralax (source of PEG 3350), methylprednisolone acetate (source of PEG 3350), and POSITIVE to triamcinolone acetonide (source of polysorbate-80). Polysorbate-80 is a component of the [employer]Johnson vaccine.  Thus would not recommend this vaccine either.   With your reaction following Pfizer vaccine would not get any further Pfizer or Moderna vaccines at this time symptoms were significant.      Her risk of having a reaction to the second dose of Pfizer vaccine is unknown but given her clinical history and results her risk would not be low.  You will have made protective titers to Pfizer vaccine dose 1 which should provide you protection again the virus.    Follow-up for environmental allergy skin testing  I appreciate the  opportunity to take part in Keiera's care. Please do not hesitate to contact me with questions.  Sincerely,   Prudy Feeler, MD Allergy/Immunology [facility]

## 2020-02-02 NOTE — Addendum Note (Signed)
Addended by: Valere Dross on: 02/02/2020 02:02 PM   Modules accepted: Orders

## 2020-02-02 NOTE — Patient Instructions (Signed)
      Discussed with patient at length that currently there is no definite skin testing available for the [place] vaccine and data is limited however, there has been a concern regarding sensitivity to PEG in patients who had anaphylactic reactions to the COVID-19 vaccine.     Today's skin prick testing was negative to Miralax (source of PEG 3350), methylprednisolone acetate (source of PEG 3350), and POSITIVE to triamcinolone acetonide (source of polysorbate-80). Polysorbate-80 is a component of the [employer]Johnson vaccine.  Thus would not recommend this vaccine either With you reaction following Pfizer vaccine would not get any further Pfizer or Moderna vaccines at this time.       Her risk of having a reaction to the second dose of pfizer vaccine is unknown but given her clinical history and results her risk would not be low.  You will have made protective titers to Pfizer vaccine dose 1 which should provide you protection again the virus.    Follow-up for environmental allergy skin testing

## 2020-03-08 ENCOUNTER — Telehealth: Payer: Self-pay | Admitting: Allergy & Immunology

## 2020-03-08 MED ORDER — PREDNISONE 10 MG PO TABS: ORAL_TABLET | ORAL | 0 refills | Status: DC

## 2020-03-08 NOTE — Telephone Encounter (Signed)
Patient called to say that she is scheduled for testing on September 1st.  She is scheduled specifically for environmental allergy testing.  She is on Zyrtec every day and is worried about getting off of it.  I reviewed her chart and she has already had a negative environmental allergy panel via the blood.  To help her get off her antihistamine, I am sending in prednisone 10 mg daily for 5 days.  She is going to start that on the Saturday before her testing.  She will continue with the Zyrtec until Sunday the 29th and then be off of her Zyrtec until testing is done on Wednesday, September 1.  I told her this should help bridge her so that she can be okay and protected against anaphylaxis for the testing.  I also told her I would be talking to Dr. Nelva Bush over the weekend and will let her know if she agrees with this plan.  Kristin Marvel, MD [facility]

## 2020-03-12 ENCOUNTER — Telehealth: Payer: Self-pay | Admitting: Allergy

## 2020-03-13 NOTE — Telephone Encounter (Signed)
Late entry.  Called by patient on 03/12/2020 around 6 PM.  She was wanting to know what the advise regimen was in regards to the prednisone that was prescribed to her by Dr. Ernst [facility].  I reviewed that she should take the prednisone starting 5 days before her skin testing visit and to discontinue her antihistamine use 3 days prior to her skin testing visit.

## 2020-03-18 NOTE — Telephone Encounter (Signed)
Patient is having an issues sleeping with the prednisone she is on to keep from having to take antihistamines prior to skin testing. She does not know what to do because she needs to sleep with her work environment. Please advise and thank you.

## 2020-03-18 NOTE — Telephone Encounter (Signed)
Patient informed to cut prednisone in half.

## 2020-03-18 NOTE — Telephone Encounter (Signed)
Can we cut the dose in half to see if that works better?  Kristin Marvel, MD [facility]

## 2020-03-19 ENCOUNTER — Telehealth: Payer: Self-pay | Admitting: Allergy

## 2020-03-19 NOTE — Telephone Encounter (Signed)
Spoke to patient about her concerns regarding skin testing tomorrow.  She has been off her zyrtec and was prescribed prednisone however it's keeping her awake.  She has been taking 1/2 tablet of 10mg  prednisone and it is still keeping her up at night. She has been taking twice a day.  Recommended that she skips tonight's dose and then takes 1/2 tablet in the morning as her skin testing is tomorrow.

## 2020-03-20 ENCOUNTER — Other Ambulatory Visit: Payer: Self-pay

## 2020-03-20 ENCOUNTER — Encounter: Payer: Self-pay | Admitting: Allergy

## 2020-03-20 ENCOUNTER — Ambulatory Visit (INDEPENDENT_AMBULATORY_CARE_PROVIDER_SITE_OTHER): Payer: BC Managed Care – PPO | Admitting: Allergy

## 2020-03-20 VITALS — BP 98/68 | HR 83 | Temp 97.9°F | Resp 16

## 2020-03-20 DIAGNOSIS — T3695XD Adverse effect of unspecified systemic antibiotic, subsequent encounter: Secondary | ICD-10-CM

## 2020-03-20 DIAGNOSIS — T3695XA Adverse effect of unspecified systemic antibiotic, initial encounter: Secondary | ICD-10-CM

## 2020-03-20 DIAGNOSIS — J3089 Other allergic rhinitis: Secondary | ICD-10-CM

## 2020-03-20 DIAGNOSIS — H1013 Acute atopic conjunctivitis, bilateral: Secondary | ICD-10-CM | POA: Diagnosis not present

## 2020-03-20 NOTE — Progress Notes (Signed)
Follow-up Note  RE: Kristin Bass MRN: [id number] DOB: [birth year] Date of Office Visit: 03/20/2020   History of present illness: Kristin Bass is a 59 y.o. female presenting today for skin testing.  She was last seen in the office on 02/02/2020 at which time we performed Covid vaccine component testing.  Environmental allergy panel via serum IgE from April 21 was negative but thus recommended she return for skin testing visit to see if she does have any environmental sensitivities at this time.  Due to her concern for being able to come off of her antihistamine as she did call the physician on call and was prescribed a 5-day course of prednisone to allow her to stop her antihistamine 3 days prior to test date.  She states the prednisone made her not be able to sleep.  she did try having the dose and still have the issues.  She again called the on-call physician the night before who recommended that she not take her prednisone dose that day and take a dose the morning of her test date.  Review of systems: Review of Systems  Constitutional: Negative.   HENT: Negative.   Eyes: Negative.   Respiratory: Negative.   Cardiovascular: Negative.   Gastrointestinal: Negative.   Musculoskeletal: Negative.   Skin: Negative.     All other systems negative unless noted above in HPI  Past medical/social/surgical/family history have been reviewed and are unchanged unless specifically indicated below.  No changes  Medication List: Current Outpatient Medications  Medication Sig Dispense Refill  . acetaminophen (TYLENOL) 500 MG tablet Take 500 mg by mouth every 6 (six) hours as needed for headache.    . Calcium Carbonate-Vitamin D (CALCIUM + D PO) Take 1 tablet by mouth daily.    . cetirizine (ZYRTEC) 10 MG tablet Take 1 tablet (10 mg total) by mouth daily. 30 tablet 5  . Docusate Calcium (STOOL SOFTENER PO) Apply 3 capsules topically at bedtime.     Marland Kitchen EPINEPHrine 0.3 mg/0.3 mL IJ SOAJ injection  Inject into the muscle.    . fluticasone (FLONASE) 50 MCG/ACT nasal spray Place 1 spray into both nostrils daily as needed for allergies.     Marland Kitchen ibuprofen (ADVIL) 200 MG tablet Take 200 mg by mouth every 6 (six) hours as needed for headache or moderate pain.    . Omega-3 Fatty Acids (FISH OIL PO) Take 2 capsules by mouth every evening.     . predniSONE (DELTASONE) 10 MG tablet Take 1 tab twice daily for five days before your allergy testing. 10 tablet 0  . Probiotic Product (PROBIOTIC PO) Take 1 capsule by mouth every evening.     . TURMERIC PO Take 1 tablet by mouth every evening.     . venlafaxine XR (EFFEXOR-XR) 150 MG 24 hr capsule Take 150 mg by mouth daily.   0  . ciprofloxacin (CIPRO) 500 MG tablet Use as directed for in office drug challenge. 3 tablet 0   No current facility-administered medications for this visit.     Known medication allergies: Allergies  Allergen Reactions  . Other Anaphylaxis and Hives    Covid Vaccine Therapist, music) caused Low BP  . Ciprofloxacin Other (See Comments)    Hypotension; taken at the same time as macrobid so pt is unaware which medication caused her reaction.   Santiago Bur [Nitrofurantoin] Other (See Comments)    Hypotension; taken at the same time as cipro so pt is unaware which medication caused her reaction.  Sarina Ill [  Sulfamethoxazole-Trimethoprim]      Physical examination: Blood pressure 98/68, pulse 83, temperature 97.9 F (36.6 C), temperature source Temporal, resp. rate 16, last menstrual period 01/18/2012, SpO2 97 %.  General: Alert, interactive, in no acute distress. HEENT: PERRLA, TMs pearly gray, turbinates non-edematous without discharge, post-pharynx non erythematous. Neck: Supple without lymphadenopathy. Lungs: Clear to auscultation without wheezing, rhonchi or rales. {no increased work of breathing. CV: Normal S1, S2 without murmurs. Abdomen: Nondistended, nontender. Skin: Warm and dry, without lesions or rashes. Extremities:   No clubbing, cyanosis or edema. Neuro:   Grossly intact.  Diagnositics/Labs:  Allergy testing: environmental allergy skin prick testing is positive to birch, box elder, pine and mucor plumbeus. Allergy testing results were read and interpreted by provider, documented by clinical staff.   Assessment and plan: Allergic rhinitis with conjunctivitis - environmental allergy testing today is positive to tree pollen and molds.  Allergen avoidance measures discussed/handouts provided - resume your routine medications at this time  Adverse medication reaction - recommend performing graded challenge first to ciprofloxacin medication.  Will go ahead and send in prescription for this medication to bring to office on day of challenge.  - for challenge recommend avoiding antihistamines for 3 days prior.  Given adverse symptoms with prednisone use would just see how you do without antihistamine moving forward.   Follow-up for graded medication challenge  I appreciate the opportunity to take part in Kristin Bass's care. Please do not hesitate to contact me with questions.  Sincerely,   Prudy Feeler, MD Allergy/Immunology [facility]

## 2020-03-20 NOTE — Patient Instructions (Addendum)
-   environmental allergy testing today is positive to tree pollen and molds.  Allergen avoidance measures discussed/handouts provided - resume your routine medications at this time  - recommend performing graded challenge first to ciprofloxacin medication.  Will go ahead and send in prescription for this medication to bring to office on day of challenge.  - for challenge recommend avoiding antihistamines for 3 days prior.  Given adverse symptoms with prednisone use would just see how you do without antihistamine moving forward.   Follow-up for graded medication challenge

## 2020-03-21 MED ORDER — CIPROFLOXACIN HCL 500 MG PO TABS: ORAL_TABLET | ORAL | 0 refills | Status: DC

## 2020-05-15 ENCOUNTER — Encounter: Payer: Self-pay | Admitting: Allergy

## 2020-05-15 ENCOUNTER — Other Ambulatory Visit: Payer: Self-pay

## 2020-05-15 ENCOUNTER — Ambulatory Visit (INDEPENDENT_AMBULATORY_CARE_PROVIDER_SITE_OTHER): Payer: BC Managed Care – PPO | Admitting: Allergy

## 2020-05-15 VITALS — BP 108/62 | HR 78 | Temp 98.7°F | Resp 16 | Wt 170.0 lb

## 2020-05-15 DIAGNOSIS — H1013 Acute atopic conjunctivitis, bilateral: Secondary | ICD-10-CM

## 2020-05-15 DIAGNOSIS — T50905D Adverse effect of unspecified drugs, medicaments and biological substances, subsequent encounter: Secondary | ICD-10-CM | POA: Diagnosis not present

## 2020-05-15 DIAGNOSIS — J3089 Other allergic rhinitis: Secondary | ICD-10-CM

## 2020-05-15 DIAGNOSIS — T3695XA Adverse effect of unspecified systemic antibiotic, initial encounter: Secondary | ICD-10-CM

## 2020-05-15 NOTE — Patient Instructions (Addendum)
-   oral challenge to ciprofloxacin was not successful today.  Developed symptoms with the first dose and treated for reaction with epinephrine and antihistamine - continue to avoid ciprofloxacin as well as macrobid and septra at this time  - continue avoidance measures for tree pollen and molds. - resume your routine medications at this time  Follow-up in 6 months or sooner if needed

## 2020-05-15 NOTE — Progress Notes (Signed)
Follow-up Note  RE: Kristin Bass MRN: [id number] DOB: 12-03-1960 Date of Office Visit: 05/15/2020   History of present illness: Kristin Bass is a 59 y.o. female presenting today for drug challenge.  She was last seen in the office on 03/20/20 by myself at which time we performed environmental allergy skin testing.   She presents today for oral drug challenge to ciprofloxacin.   Her history of taking ciprofloxacin is unclear but occurred at least 10 years ago.  She had a UTI and recalls taking macrobid which didn't help thus the antibiotic was changed to maybe septra or cipro - this is what is unclear.  After taking this second antibiotic she reports itching and feeling dizzy and light headed and a possible syncopal event.  Since this incident she has not had ciprofloxacin, septra or macrobid.  She has held antihistamine for the past 3 days for challenge today.  She is noticing more itchy eyes and some post-nasal drip off antihistamines.  Otherwise she is in her normal state of health today without any recent illness or antihistamine use.   Review of systems: Review of Systems  Constitutional: Negative.   HENT:       See HPI  Eyes: Negative.   Respiratory: Negative.   Cardiovascular: Negative.   Gastrointestinal: Negative.   Musculoskeletal: Negative.   Skin: Negative.   Neurological: Negative.     All other systems negative unless noted above in HPI  Past medical/social/surgical/family history have been reviewed and are unchanged unless specifically indicated below.  No changes  Medication List: Current Outpatient Medications  Medication Sig Dispense Refill  . acetaminophen (TYLENOL) 500 MG tablet Take 500 mg by mouth every 6 (six) hours as needed for headache.    . Calcium Carbonate-Vitamin D (CALCIUM + D PO) Take 1 tablet by mouth daily.    . cetirizine (ZYRTEC) 10 MG tablet Take 1 tablet (10 mg total) by mouth daily. 30 tablet 5  . Docusate Calcium (STOOL SOFTENER PO) Apply  3 capsules topically at bedtime.     Marland Kitchen EPINEPHrine 0.3 mg/0.3 mL IJ SOAJ injection Inject into the muscle.    . fluticasone (FLONASE) 50 MCG/ACT nasal spray Place 1 spray into both nostrils daily as needed for allergies.     Marland Kitchen ibuprofen (ADVIL) 200 MG tablet Take 200 mg by mouth every 6 (six) hours as needed for headache or moderate pain.    . Omega-3 Fatty Acids (FISH OIL PO) Take 2 capsules by mouth every evening.     . Probiotic Product (PROBIOTIC PO) Take 1 capsule by mouth every evening.     . TURMERIC PO Take 1 tablet by mouth every evening.     . venlafaxine XR (EFFEXOR-XR) 150 MG 24 hr capsule Take 150 mg by mouth daily.   0   No current facility-administered medications for this visit.     Known medication allergies: Allergies  Allergen Reactions  . Other Anaphylaxis and Hives    Covid Vaccine Therapist, music) caused Low BP  . Ciprofloxacin Other (See Comments)    Hypotension; taken at the same time as macrobid so pt is unaware which medication caused her reaction.  05/15/20 attempted oral challenge and developed tingling and heaviness sensation of extremities and lightheadedness without hypotension or vital sign changes.    Santiago Bur [Nitrofurantoin] Other (See Comments)    Hypotension; taken at the same time as cipro so pt is unaware which medication caused her reaction.  Sarina Ill [Sulfamethoxazole-Trimethoprim]  Physical examination: Blood pressure 108/78, pulse 73, temperature 98.7 F (37.1 C), temperature source Oral, resp. rate 14, weight 170 lb (77.1 kg), last menstrual period 01/18/2012, SpO2 98 %.  General: Alert, interactive, in no acute distress. HEENT: PERRLA, TMs pearly gray, turbinates mildly edematous without discharge, post-pharynx non erythematous. Neck: Supple without lymphadenopathy. Lungs: Clear to auscultation without wheezing, rhonchi or rales. {no increased work of breathing. CV: Normal S1, S2 without murmurs. Abdomen: Nondistended,  nontender. Skin: Warm and dry, without lesions or rashes. Extremities:  No clubbing, cyanosis or edema. Neuro:   Grossly intact.  Diagnositics/Labs: Drug challenge to Ciprofloxacin 500mg  tablet. Benefits and risks of challenge discussed and consent obtained.  She was provided with a 1% dose of ciprofloxacin at 09:27.  Approximately 5 minutes after the dose she reports feeling a tingling sensation in extremities and heaviness and a bit lightheaded and started to slouch in the seat.  Denies any difficulty breathing, itching, nausea or abd pain.  Given epi 0.3mg  and zyrtec 20mg .  She wanted to lay down.  Vitals were stable.  Exam unchanged from prior.  About 15 minutes later she reported that the heaviness in her extremities was subsiding.  She was observed and had full resolution of symptoms other than being cold and able to d/c home at 11:24  Vitals were obtained prior to discharge and remained stable.    Assessment and plan: Allergic reaction due to medication  - oral challenge to ciprofloxacin was not successful today.  Developed symptoms with the first dose and treated for reaction with epinephrine and antihistamine - continue to avoid ciprofloxacin as well as macrobid and septra at this time  Allergic rhinitis with conjunctivitis - continue avoidance measures for tree pollen and molds. - resume your routine medications at this time including zyrtec, flonase and pataday  Follow-up in 6 months or sooner if needed   I appreciate the opportunity to take part in Kristin Bass's care. Please do not hesitate to contact me with questions.  Sincerely,   Prudy Feeler, MD Allergy/Immunology [facility]

## 2020-05-16 ENCOUNTER — Telehealth: Payer: Self-pay | Admitting: *Deleted

## 2020-05-16 NOTE — Telephone Encounter (Signed)
Called and spoke with patient to follow up on how she was doing from Cipro challenge and reaction on 05/15/20.  Patient stated she was doing well today and not having any issues.  Patient did well after leaving the office yesterday other than being fatigued.  Dr. Nelva Bush notified of update.

## 2020-07-16 ENCOUNTER — Telehealth: Payer: Self-pay | Admitting: Allergy

## 2020-07-16 NOTE — Telephone Encounter (Signed)
Called and spoke to patient and informed her that she could get a bracelet that alerts people that she has a medicine allergy and that it causes anaphylactic reactions and that it can also state that she has a Epipen.

## 2020-07-16 NOTE — Telephone Encounter (Signed)
Patient states she is getting an allergy bracelet and would like recommendations on what to put on it since she has anaphylaxic reactions and has to use an epi pen.  Please advise.

## 2020-10-09 ENCOUNTER — Other Ambulatory Visit: Payer: Self-pay | Admitting: Allergy

## 2020-10-23 ENCOUNTER — Telehealth: Payer: Self-pay | Admitting: Allergy & Immunology

## 2020-10-23 NOTE — Telephone Encounter (Signed)
Does she have an eye doctor?  If she does and if her eyes are burning and red is not resolving with the eyedrop and it might be beneficial to have her eyes checked. We can send in Bepreve 1 drop each eye twice a day as needed for itchy, watery eyes

## 2020-10-23 NOTE — Telephone Encounter (Signed)
Does she still have Pataday?  Has she been using the Pataday with her eye symptoms currently?   If so she finding relief?  If not then can recommend a different eyedrop that might have better effect.

## 2020-10-23 NOTE — Telephone Encounter (Signed)
Please advise to change in eye drop or going to see eye dr for pt

## 2020-10-23 NOTE — Telephone Encounter (Signed)
Called patient and couldn't get anyone on the phone to verify pharmacy for the Bepreve to be sent to. Called spouses phone and he told me I couldn't leave a message with him.   Will attempt to call again.  [phone]

## 2020-10-23 NOTE — Telephone Encounter (Signed)
Patient called back and Pataday is not working. Her eyes are inflamed. She said she would try a different eyedrop.She would also like to be called back soon in case Dr. Nelva Bush recommends her going to her eye doctor today.

## 2020-10-23 NOTE — Telephone Encounter (Signed)
Patient called this morning reporting eye burning and itching. They had a fire drill yesterday at school and her eyes have been having issues since that time. She has a history of elevated tryptase and she is wondering whether this is related.   I did not know her atopic history when she called, but she is wondering if she needs to be seen. Discussed with Dr. Nelva Bush who recommended sending in an eye drop. Routing to her for further management.   Salvatore Marvel, MD [facility]

## 2020-10-23 NOTE — Telephone Encounter (Signed)
Tried calling pt but was given a busy tone after 3 rings will try again later.

## 2020-10-25 NOTE — Telephone Encounter (Signed)
No tryptase is not related to how well she can fight infections or not.  Tryptase is mostly related with susceptibility to allergic reactions.

## 2020-10-25 NOTE — Telephone Encounter (Signed)
Elevated tryptase should not be causing the eye symptoms.  If using old makeup this absolutely could cause symptoms she is experiencing.  Would recommend removal of all facial makeup including mascara daily to avoid build-up and residue on the eye lashes and skin that could trap dead skin cells and bacteria on skin that can cause conjunctivitis.

## 2020-10-25 NOTE — Telephone Encounter (Signed)
Patient called back and was informed. Patient verbalized understanding.

## 2020-10-25 NOTE — Telephone Encounter (Addendum)
Patient called back and states we had the wrong number phone on file, updated this. Patient went to the eye doctor on 4-6 and was told she had a bad virus in her eyes/ chronic conjunctivitis. Eye doctor told her she had a very weakened immune system and most people would be able to fight that virus. Patient was prescribed doxycycline, a cream to apply at night and eye drops. Patient states her eyes are getting much better. Patient had a few questions and wanted to talk to Dr. Nelva Bush, but there are no slots for a televisit today. Patient wanted to know if having an elevated tryptase had any relation to these frequent eye problems or if it could be old makeup.  Patient would like a call back to discuss all these questions. [phone].  Please advise.

## 2020-10-25 NOTE — Telephone Encounter (Signed)
Please advise to if the tryptase being elevated or old make could contribute to her eye issues

## 2020-10-25 NOTE — Telephone Encounter (Signed)
The eye dr mentioned that most people can fight this infection off better then the pt is able too, due to the elevated tryptase. Patient  wanted to know if she is more susceptible  to getting infections due to the elevated tryptase.

## 2020-10-25 NOTE — Telephone Encounter (Signed)
Called and left a voicemail asking for patient to return call to inform.  

## 2020-10-31 NOTE — Progress Notes (Addendum)
60 y.o. [id number] Married White or Caucasian female here for annual exam.   Has Adopted child, now 66 years old   Patient's last menstrual period was 01/18/2012.        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  The current method of family planning is vasectomy.    Exercising: Yes.    fitness walking Smoker:  no  Health Maintenance: Pap:  01-31-17 neg HPV HR neg History of abnormal Pap:  no MMG:  04-01-2020 category a density birads 1:neg Colonoscopy:  2019 f/u 21yrs BMD:   Done yrs ago Gardasil:   n/a Covid-19: pfizer Hep C testing: neg 2016 Screening Labs: with screening   reports that she has quit smoking. She has never used smokeless tobacco. She reports that she does not drink alcohol and does not use drugs.  Past Medical History:  Diagnosis Date  . Anxiety   . History of degenerative disc disease    mild  . Infertility, female   . Mild scoliosis   . Myofascial pain syndrome   . Precancerous lesion   . Urticaria   . Wrist fracture     Past Surgical History:  Procedure Laterality Date  . BREAST CYST EXCISION Left   . CYSTECTOMY     from neck  . DILATION AND CURETTAGE OF UTERUS     times 2  . FOOT SURGERY Left 11/11   hammer toe & plantar fascitis  . MOUTH SURGERY     bone graft  . TONSILLECTOMY    . VEIN LIGATION AND STRIPPING      Current Outpatient Medications  Medication Sig Dispense Refill  . acetaminophen (TYLENOL) 500 MG tablet Take 500 mg by mouth every 6 (six) hours as needed for headache.    . Calcium Carbonate-Vitamin D (CALCIUM + D PO) Take 1 tablet by mouth daily.    . cetirizine (ZYRTEC) 10 MG tablet TAKE 1 TABLET BY MOUTH DAILY. 30 tablet 0  . Docusate Calcium (STOOL SOFTENER PO) Apply 3 capsules topically at bedtime.     Marland Kitchen EPINEPHrine 0.3 mg/0.3 mL IJ SOAJ injection Inject  into the muscle.    . fluticasone (FLONASE) 50 MCG/ACT nasal spray Place 1 spray into both nostrils daily as needed for allergies.     Marland Kitchen ibuprofen (ADVIL) 200 MG tablet Take 200 mg by mouth every 6 (six) hours as needed for headache or moderate pain.    . Omega-3 Fatty Acids (FISH OIL PO) Take 2 capsules by mouth every evening.     . Probiotic Product (PROBIOTIC PO) Take 1 capsule by mouth every evening.     . TURMERIC PO Take 1 tablet by mouth every evening.     . venlafaxine XR (EFFEXOR-XR) 150 MG 24 hr capsule Take 150 mg by mouth daily.   0   No current facility-administered medications for this visit.    Family History  Problem Relation Age of Onset  . Colon cancer Mother   . Hypertension Father   . Anxiety disorder Father   . Diabetes Maternal Grandfather   . Hypertension Maternal Grandmother   . COPD Maternal Aunt   . Stroke Maternal Aunt   . Lung cancer Maternal Aunt   . Immunodeficiency Sister   . Allergic rhinitis Neg Hx   . Angioedema Neg Hx   . Asthma  Neg Hx   . Atopy Neg Hx   . Eczema Neg Hx   . Urticaria Neg Hx     Review of Systems  Constitutional: Negative.   HENT: Negative.   Eyes: Negative.   Respiratory: Negative.   Cardiovascular: Negative.   Gastrointestinal: Negative.   Endocrine: Negative.   Genitourinary:       Pain with intercourse  Musculoskeletal: Negative.   Skin: Negative.   Allergic/Immunologic: Negative.   Neurological: Negative.   Hematological: Negative.   Psychiatric/Behavioral: Negative.     Exam:   Ht 5' 8.25" (1.734 m)   Wt 180 lb (81.6 kg)   LMP 01/18/2012   BMI 27.17 kg/m   Height: 5' 8.25" (173.4 cm)  General appearance: alert, cooperative and appears stated age, no acute distress Head: Normocephalic, without obvious abnormality Neck: no adenopathy, thyroid normal to inspection and palpation Lungs: clear to auscultation bilaterally Breasts: No axillary or supraclavicular adenopathy, Normal to palpation without dominant  masses Heart: regular rate and rhythm Abdomen: soft, non-tender; no masses,  no organomegaly Extremities: extremities normal, no edema Skin: No rashes or lesions Lymph nodes: Cervical, supraclavicular, and axillary nodes normal. No abnormal inguinal nodes palpated Neurologic: Grossly normal   Pelvic: External genitalia:  no lesions              Urethra:  normal appearing urethra with no masses, tenderness or lesions              Bartholins and Skenes: normal                 Vagina: normal appearing vagina, appropriate for age, normal appearing discharge, no lesions              Cervix: neg cervical motion tenderness, no visible lesions             Bimanual Exam:   Uterus:  normal size, contour, position, consistency, mobility, non-tender              Adnexa: no mass, fullness, tenderness                 Joy, CMA Chaperone was present for exam.  A:  Well Woman with normal exam  Well woman exam with routine gynecological exam  Vaginismus - Plan: lidocaine (XYLOCAINE) 5 % ointment  Dyspareunia, female - Plan: estradiol (ESTRACE) 0.1 MG/GM vaginal cream    P:   Pap : due 2023  Mammogram: due 03/2021  Labs: with PCP  DEXA: pt will want later this year, will get at [facility]  (mother with Hx Osteoporosis)

## 2020-11-04 ENCOUNTER — Other Ambulatory Visit: Payer: Self-pay | Admitting: Allergy

## 2020-11-04 ENCOUNTER — Other Ambulatory Visit: Payer: Self-pay

## 2020-11-04 ENCOUNTER — Encounter: Payer: Self-pay | Admitting: Nurse Practitioner

## 2020-11-04 ENCOUNTER — Ambulatory Visit (INDEPENDENT_AMBULATORY_CARE_PROVIDER_SITE_OTHER): Payer: BC Managed Care – PPO | Admitting: Nurse Practitioner

## 2020-11-04 VITALS — BP 102/70 | HR 64 | Resp 16 | Ht 68.25 in | Wt 180.0 lb

## 2020-11-04 DIAGNOSIS — N942 Vaginismus: Secondary | ICD-10-CM | POA: Diagnosis not present

## 2020-11-04 DIAGNOSIS — Z01419 Encounter for gynecological examination (general) (routine) without abnormal findings: Secondary | ICD-10-CM | POA: Diagnosis not present

## 2020-11-04 DIAGNOSIS — N941 Unspecified dyspareunia: Secondary | ICD-10-CM

## 2020-11-04 MED ORDER — LIDOCAINE 5 % EX OINT: 1.0000 "application " | TOPICAL_OINTMENT | Freq: Three times a day (TID) | CUTANEOUS | 0 refills | Status: DC

## 2020-11-04 MED ORDER — ESTRADIOL 0.1 MG/GM VA CREA: TOPICAL_CREAM | VAGINAL | 2 refills | Status: DC

## 2020-11-04 NOTE — Patient Instructions (Addendum)
Kristin Bass is a beneficial lubricant May try vaginal dilators (available on Amazon) Lidocaine 5% gel, use externally to help reduce sensation Estrogen (vaginal) will help with flexibility of the vagina. You can use it every day for 14 days, then reduce down to maintenance therapy, twice weekly May consider Sex Therapy: https://[email or web address]/  Health Maintenance, Female Adopting a healthy lifestyle and getting preventive care are important in promoting health and wellness. Ask your health care provider about:  The right schedule for you to have regular tests and exams.  Things you can do on your own to prevent diseases and keep yourself healthy. What should I know about diet, weight, and exercise? Eat a healthy diet  Eat a diet that includes plenty of vegetables, fruits, low-fat dairy products, and lean protein.  Do not eat a lot of foods that are high in solid fats, added sugars, or sodium.   Maintain a healthy weight Body mass index (BMI) is used to identify weight problems. It estimates body fat based on height and weight. Your health care provider can help determine your BMI and help you achieve or maintain a healthy weight. Get regular exercise Get regular exercise. This is one of the most important things you can do for your health. Most adults should:  Exercise for at least 150 minutes each week. The exercise should increase your heart rate and make you sweat (moderate-intensity exercise).  Do strengthening exercises at least twice a week. This is in addition to the moderate-intensity exercise.  Spend less time sitting. Even light physical activity can be beneficial. Watch cholesterol and blood lipids Have your blood tested for lipids and cholesterol at 60 years of age, then have this test every 5 years. Have your cholesterol levels checked more often if:  Your lipid or cholesterol levels are high.  You are older than 60 years of age.  You are at high risk for heart  disease. What should I know about cancer screening? Depending on your health history and family history, you may need to have cancer screening at various ages. This may include screening for:  Breast cancer.  Cervical cancer.  Colorectal cancer.  Skin cancer.  Lung cancer. What should I know about heart disease, diabetes, and high blood pressure? Blood pressure and heart disease  High blood pressure causes heart disease and increases the risk of stroke. This is more likely to develop in people who have high blood pressure readings, are of African descent, or are overweight.  Have your blood pressure checked: ? Every 3-5 years if you are 10-24 years of age. ? Every year if you are 25 years old or older. Diabetes Have regular diabetes screenings. This checks your fasting blood sugar level. Have the screening done:  Once every three years after age 72 if you are at a normal weight and have a low risk for diabetes.  More often and at a younger age if you are overweight or have a high risk for diabetes. What should I know about preventing infection? Hepatitis B If you have a higher risk for hepatitis B, you should be screened for this virus. Talk with your health care provider to find out if you are at risk for hepatitis B infection. Hepatitis C Testing is recommended for:  Everyone born from [birth year] through 1965.  Anyone with known risk factors for hepatitis C. Sexually transmitted infections (STIs)  Get screened for STIs, including gonorrhea and chlamydia, if: ? You are sexually active and are younger than 60 years of  age. ? You are older than 60 years of age and your health care provider tells you that you are at risk for this type of infection. ? Your sexual activity has changed since you were last screened, and you are at increased risk for chlamydia or gonorrhea. Ask your health care provider if you are at risk.  Ask your health care provider about whether you are at high  risk for HIV. Your health care provider may recommend a prescription medicine to help prevent HIV infection. If you choose to take medicine to prevent HIV, you should first get tested for HIV. You should then be tested every 3 months for as long as you are taking the medicine. Pregnancy  If you are about to stop having your period (premenopausal) and you may become pregnant, seek counseling before you get pregnant.  Take 400 to 800 micrograms (mcg) of folic acid every day if you become pregnant.  Ask for birth control (contraception) if you want to prevent pregnancy. Osteoporosis and menopause Osteoporosis is a disease in which the bones lose minerals and strength with aging. This can result in bone fractures. If you are 22 years old or older, or if you are at risk for osteoporosis and fractures, ask your health care provider if you should:  Be screened for bone loss.  Take a calcium or vitamin D supplement to lower your risk of fractures.  Be given hormone replacement therapy (HRT) to treat symptoms of menopause. Follow these instructions at home: Lifestyle  Do not use any products that contain nicotine or tobacco, such as cigarettes, e-cigarettes, and chewing tobacco. If you need help quitting, ask your health care provider.  Do not use street drugs.  Do not share needles.  Ask your health care provider for help if you need support or information about quitting drugs. Alcohol use  Do not drink alcohol if: ? Your health care provider tells you not to drink. ? You are pregnant, may be pregnant, or are planning to become pregnant.  If you drink alcohol: ? Limit how much you use to 0-1 drink a day. ? Limit intake if you are breastfeeding.  Be aware of how much alcohol is in your drink. In the [place] one drink equals one 12 oz bottle of beer (355 mL), one 5 oz glass of wine (148 mL), or one 1 oz glass of hard liquor (44 mL). General instructions  Schedule regular health, dental,  and eye exams.  Stay current with your vaccines.  Tell your health care provider if: ? You often feel depressed. ? You have ever been abused or do not feel safe at home. Summary  Adopting a healthy lifestyle and getting preventive care are important in promoting health and wellness.  Follow your health care provider's instructions about healthy diet, exercising, and getting tested or screened for diseases.  Follow your health care provider's instructions on monitoring your cholesterol and blood pressure. This information is not intended to replace advice given to you by your health care provider. Make sure you discuss any questions you have with your health care provider. Document Revised: 06/29/2018 Document Reviewed: 06/29/2018 Elsevier Patient Education  2021 [employer].

## 2020-11-05 ENCOUNTER — Telehealth: Payer: Self-pay

## 2020-11-05 NOTE — Telephone Encounter (Signed)
Patient had several things she had questions about related to visit yesterday:  She said she was told to Use the Estradiol cream every day x 14 days then twice weekly.  She asked how much she should insert when she is using it daily x 14 days?  She still has her uterus and said she was reading how it is not a good thing to be on Estrogen long term.

## 2020-11-07 ENCOUNTER — Encounter: Payer: Self-pay | Admitting: Nurse Practitioner

## 2020-11-09 ENCOUNTER — Encounter: Payer: Self-pay | Admitting: Emergency Medicine

## 2020-11-09 ENCOUNTER — Other Ambulatory Visit: Payer: Self-pay

## 2020-11-09 ENCOUNTER — Ambulatory Visit
Admission: EM | Admit: 2020-11-09 | Discharge: 2020-11-09 | Disposition: A | Discharge status: Home / Self Care | Payer: BC Managed Care – PPO | Attending: Family Medicine | Admitting: Family Medicine

## 2020-11-09 DIAGNOSIS — J069 Acute upper respiratory infection, unspecified: Secondary | ICD-10-CM

## 2020-11-09 NOTE — ED Provider Notes (Signed)
[facility]   [id number] 11/09/20 Arrival Time: 7209  ASSESSMENT & PLAN:  1. Viral URI    May use OTC AFRIN QHS 2-3 max. Discussed typical duration of viral illnesses.    Follow-up Information    Antony Contras, MD.   Specialty: Family Medicine Why: As needed. Contact information: [address] [place] [address] [facility] .   Specialty: Urgent Care Why: If worsening or failing to improve as anticipated. Contact information: [address] [place] [id number] [place] [id number] [phone]              Reviewed expectations re: course of current medical issues. Questions answered. Outlined signs and symptoms indicating need for more acute intervention. Understanding verbalized. After Visit Summary given.   SUBJECTIVE: History from: patient. Kristin Bass is a 60 y.o. female who reports nasal congestion, sinus pressure, sneezing, mild cough, watery eyes, ST; abrupt onset 2 d ago; afebrile. Zyrtec without much relief. Denies: difficulty breathing. Normal PO intake without n/v/d.    OBJECTIVE:  Vitals:   11/09/20 0910  BP: 105/73  Pulse: 89  Resp: 16  Temp: 98.2 F (36.8 C)  TempSrc: Oral  SpO2: 98%    General appearance: alert; no distress Eyes: PERRLA; EOMI; conjunctiva normal HENT: [place]; AT; with nasal congestion; TMs appear normal; throat mild cobblestoning Neck: supple  Lungs: speaks full sentences without difficulty; unlabored Extremities: no edema Skin: warm and dry Neurologic: normal gait Psychological: alert and cooperative; normal mood and affect   Allergies  Allergen Reactions  . Other Anaphylaxis and Hives    Covid Vaccine Therapist, music) caused Low BP  . Ciprofloxacin Other (See Comments)    Hypotension; taken at the same time as macrobid so pt is unaware which medication caused her reaction.  05/15/20 attempted oral challenge and  developed tingling and heaviness sensation of extremities and lightheadedness without hypotension or vital sign changes.    Santiago Bur [Nitrofurantoin] Other (See Comments)    Hypotension; taken at the same time as cipro so pt is unaware which medication caused her reaction.  Sarina Ill [Sulfamethoxazole-Trimethoprim]     Past Medical History:  Diagnosis Date  . Anxiety   . History of degenerative disc disease    mild  . Infertility, female   . Mild scoliosis   . Myofascial pain syndrome   . Precancerous lesion   . Urticaria   . Wrist fracture    Social History   Socioeconomic History  . Marital status: Married    Spouse name: Christean Grief  . Number of children: 1  . Years of education: AA  . Highest education level: Not on file  Occupational History  . Occupation: [employer]  Tobacco Use  . Smoking status: Former Research scientist (life sciences)  . Smokeless tobacco: Never Used  Vaping Use  . Vaping Use: Never used  Substance and Sexual Activity  . Alcohol use: No  . Drug use: No  . Sexual activity: Not Currently    Partners: Male    Comment: husband vasectomy  Other Topics Concern  . Not on file  Social History Narrative   Lives at home with husband, Christean Grief.   Caffeine use:  2 cups coffee per day   Social Determinants of Health   Financial Resource Strain: Not on file  Food Insecurity: Not on file  Transportation Needs: Not on file  Physical Activity: Not on file  Stress: Not  on file  Social Connections: Not on file  Intimate Partner Violence: Not on file   Family History  Problem Relation Age of Onset  . Colon cancer Mother   . Hypertension Father   . Anxiety disorder Father   . Diabetes Maternal Grandfather   . Hypertension Maternal Grandmother   . COPD Maternal Aunt   . Stroke Maternal Aunt   . Lung cancer Maternal Aunt   . Immunodeficiency Sister   . Allergic rhinitis Neg Hx   . Angioedema Neg Hx   . Asthma Neg Hx   . Atopy Neg Hx   . Eczema Neg Hx   .  Urticaria Neg Hx    Past Surgical History:  Procedure Laterality Date  . BREAST CYST EXCISION Left   . CYSTECTOMY     from neck  . DILATION AND CURETTAGE OF UTERUS     times 2  . FOOT SURGERY Left 11/11   hammer toe & plantar fascitis  . MOUTH SURGERY     bone graft  . TONSILLECTOMY    . VEIN LIGATION AND Dulce Sellar, MD 11/09/20 1001

## 2020-11-09 NOTE — ED Triage Notes (Signed)
Patient c/o sinus pressure x 2 days.   Patient denies any cough, watery eyes, sore throat or fever.   Patient endorses sneezing.   Patient endorses taking Zyrtec with no relief of symptoms.

## 2020-11-20 ENCOUNTER — Encounter: Payer: Self-pay | Admitting: Nurse Practitioner

## 2020-11-21 ENCOUNTER — Encounter: Payer: Self-pay | Admitting: Nurse Practitioner

## 2020-11-21 ENCOUNTER — Other Ambulatory Visit: Payer: Self-pay

## 2020-11-21 ENCOUNTER — Ambulatory Visit (INDEPENDENT_AMBULATORY_CARE_PROVIDER_SITE_OTHER): Payer: BC Managed Care – PPO | Admitting: Nurse Practitioner

## 2020-11-21 VITALS — BP 110/74 | HR 74 | Temp 98.4°F | Resp 16 | Wt 180.0 lb

## 2020-11-21 DIAGNOSIS — R3989 Other symptoms and signs involving the genitourinary system: Secondary | ICD-10-CM

## 2020-11-21 LAB — URINALYSIS, COMPLETE W/RFL CULTURE
Bacteria, UA: NONE SEEN /HPF
Bilirubin Urine: NEGATIVE
Glucose, UA: NEGATIVE
Hgb urine dipstick: NEGATIVE
Hyaline Cast: NONE SEEN /LPF
Ketones, ur: NEGATIVE
Leukocyte Esterase: NEGATIVE
Nitrites, Initial: NEGATIVE
Protein, ur: NEGATIVE
RBC / HPF: NONE SEEN /HPF (ref 0–2)
Specific Gravity, Urine: 1.01 (ref 1.001–1.035)
WBC, UA: NONE SEEN /HPF (ref 0–5)
pH: 7 (ref 5.0–8.0)

## 2020-11-21 LAB — NO CULTURE INDICATED

## 2020-11-21 NOTE — Patient Instructions (Signed)
Please continue to monitor, we can be in close contact via My Chart. Karma Ganja, [facility]

## 2020-11-21 NOTE — Progress Notes (Signed)
GYNECOLOGY  VISIT  CC:   Urine look pink this morning  HPI: 60 y.o. G30P0020 Married White or Caucasian female here for blood in urine. 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 The patient states she tested yesterday & hers was negative. Her husband tested this morning & he was positive. Patient states they don't have any symptoms of Covid.  Patients urine lab results were negative today. Patient states she doesn't have any symptoms of uti or vaginitis. Patient last used vaginal dilators 3 days ago. Patient will continue to use her vaginal creams but will discontinue dilator for now. Patient to call Tuesday to let us know if she is still noticing any bleeding.     GYNECOLOGIC HISTORY: Patient's last menstrual period was 01/18/2012. Contraception: post menopausal Menopausal hormone therapy: estrace vaginal cream  Patient Active Problem List   Diagnosis Date Noted  . Pain of right hip joint 12/25/2018  . 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  . 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 Back pain 09/09/2017  . OSA (obstructive sleep apnea) 12/07/2016  . Bilateral temporomandibular joint pain 08/23/2016  . Epidermoid cyst of skin 08/23/2016  . Snoring 08/23/2016  . 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 Sudden hearing loss, left 06/10/2016  . 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  . Radiculopathy of lumbosacral region 01/06/2015    Past Medical History:  Diagnosis Date  . Anxiety   . History of degenerative disc disease    mild  . Infertility, female   . Mild scoliosis   . Myofascial pain syndrome   . Precancerous lesion   . Urticaria   . Wrist fracture     Past Surgical History:  Procedure Laterality Date  . BREAST CYST EXCISION Left   . CYSTECTOMY     from neck  . DILATION AND CURETTAGE OF UTERUS     times 2  . FOOT SURGERY Left 11/11   hammer toe & plantar fascitis  . MOUTH SURGERY     bone graft  . TONSILLECTOMY    . VEIN  LIGATION AND STRIPPING      MEDS:   Current Outpatient Medications on File Prior to Visit  Medication Sig Dispense Refill  . acetaminophen (TYLENOL) 500 MG tablet Take 500 mg by mouth every 6 (six) hours as needed for headache.    . Calcium Carbonate-Vitamin D (CALCIUM + D PO) Take 1 tablet by mouth daily.    . cetirizine (ZYRTEC) 10 MG tablet TAKE 1 TABLET BY MOUTH DAILY. 30 tablet 0  . Docusate Calcium (STOOL SOFTENER PO) Apply 3 capsules topically at bedtime.     Marland Kitchen estradiol (ESTRACE) 0.1 MG/GM vaginal cream 1 gram vaginally twice weekly 42.5 g 2  . fluticasone (FLONASE) 50 MCG/ACT nasal spray Place 1 spray into both nostrils daily as needed for allergies.     Marland Kitchen ibuprofen (ADVIL) 200 MG tablet Take 200 mg by mouth every 6 (six) hours as needed for headache or moderate pain.    Marland Kitchen lidocaine (XYLOCAINE) 5 % ointment Apply 1 application topically 3 (three) times daily. 1.25 g 0  . Omega-3 Fatty Acids (FISH OIL PO) Take 2 capsules by mouth every evening.     . Probiotic Product (PROBIOTIC PO) Take 1 capsule by mouth every evening.     . TURMERIC PO Take 1 tablet by mouth every evening.     . venlafaxine XR (EFFEXOR-XR) 150 MG 24 hr capsule Take 150 mg by mouth daily.   0  .  EPINEPHrine 0.3 mg/0.3 mL IJ SOAJ injection Inject into the muscle. (Patient not taking: Reported on 11/21/2020)     No current facility-administered medications on file prior to visit.    ALLERGIES: Other, Ciprofloxacin, Macrobid [nitrofurantoin], and Septra [sulfamethoxazole-trimethoprim]  Family History  Problem Relation Age of Onset  . Colon cancer Mother   . Hypertension Father   . Anxiety disorder Father   . Diabetes Maternal Grandfather   . Hypertension Maternal Grandmother   . COPD Maternal Aunt   . Stroke Maternal Aunt   . Lung cancer Maternal Aunt   . Immunodeficiency Sister   . Allergic rhinitis Neg Hx   . Angioedema Neg Hx   . Asthma Neg Hx   . Atopy Neg Hx   . Eczema Neg Hx   . Urticaria Neg Hx       Review of Systems  PHYSICAL EXAMINATION:    BP 110/74   Pulse 74   Temp 98.4 F (36.9 C) (Oral)   Resp 16   Wt 180 lb (81.6 kg)   LMP 01/18/2012   BMI 27.17 kg/m     General appearance: alert, cooperative, no acute distress  Assessment: Urine appeared red this morning Exposure to COVID No Acute distress  Plan: Negative blood in urine today, normal urinalysis. F/U if symptoms persist    NP was involved in evaluating urinalysis but did not physically assess patient r/t Covid exposure to close contact at work and husband. Joy, CMA assessed and relayed information in compliance with Covid precautions.

## 2020-11-22 ENCOUNTER — Encounter: Payer: Self-pay | Admitting: Nurse Practitioner

## 2020-11-26 ENCOUNTER — Telehealth: Payer: Self-pay | Admitting: Genetic Counselor

## 2020-11-26 NOTE — Telephone Encounter (Signed)
Patient called to ask if she could schedule genetics appointment for FH of immune disorder/chromosome finding.  Discussed that out of scope of genetics at [facility].  Directed to [facility] or [employer].

## 2020-12-02 ENCOUNTER — Encounter: Payer: Self-pay | Admitting: Nurse Practitioner

## 2020-12-09 ENCOUNTER — Other Ambulatory Visit: Payer: Self-pay | Admitting: Allergy

## 2021-01-05 ENCOUNTER — Encounter: Payer: Self-pay | Admitting: Nurse Practitioner

## 2021-01-06 ENCOUNTER — Encounter: Payer: Self-pay | Admitting: Nurse Practitioner

## 2021-01-06 ENCOUNTER — Ambulatory Visit (INDEPENDENT_AMBULATORY_CARE_PROVIDER_SITE_OTHER): Payer: BC Managed Care – PPO | Admitting: Nurse Practitioner

## 2021-01-06 ENCOUNTER — Other Ambulatory Visit: Payer: Self-pay

## 2021-01-06 VITALS — BP 120/70 | HR 82 | Resp 16 | Wt 181.0 lb

## 2021-01-06 DIAGNOSIS — R309 Painful micturition, unspecified: Secondary | ICD-10-CM

## 2021-01-06 DIAGNOSIS — B3731 Acute candidiasis of vulva and vagina: Secondary | ICD-10-CM

## 2021-01-06 DIAGNOSIS — B373 Candidiasis of vulva and vagina: Secondary | ICD-10-CM | POA: Diagnosis not present

## 2021-01-06 DIAGNOSIS — N949 Unspecified condition associated with female genital organs and menstrual cycle: Secondary | ICD-10-CM

## 2021-01-06 LAB — WET PREP FOR TRICH, YEAST, CLUE

## 2021-01-06 MED ORDER — FLUCONAZOLE 150 MG PO TABS: ORAL_TABLET | ORAL | 0 refills | Status: DC

## 2021-01-06 NOTE — Progress Notes (Signed)
GYNECOLOGY  VISIT  CC:   Vaginitis  HPI: 60 y.o. G7P0020 Married White or Caucasian female here for vaginal irritation, dryness & some itching. Symptoms started these symptoms On Saturday (01/04/2021) when she got in a Jacuzzi tub  The last time she used estrogen cream is Saturday, only small amount because she is almost out and can't pick it up again until 01/11/2021  GYNECOLOGIC HISTORY: Patient's last menstrual period was 01/18/2012. Contraception: post menopausal Menopausal hormone therapy: estrace vaginal cream  Patient Active Problem List   Diagnosis Date Noted   Pain of right hip joint 12/25/2018   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   Back pain 09/09/2017   OSA (obstructive sleep apnea) 12/07/2016   Bilateral temporomandibular joint pain 08/23/2016   Epidermoid cyst of skin 08/23/2016   Snoring 08/23/2016   Tinnitus aurium, 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   Sudden hearing loss, left 06/10/2016   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   Radiculopathy of lumbosacral region 01/06/2015    Past Medical History:  Diagnosis Date   Anxiety    History of degenerative disc disease    mild   Infertility, female    Mild scoliosis    Myofascial pain syndrome    Precancerous lesion    Urticaria    Wrist fracture     Past Surgical History:  Procedure Laterality Date   BREAST CYST EXCISION Left    CYSTECTOMY     from neck   DILATION AND CURETTAGE OF UTERUS     times 2   FOOT SURGERY Left 11/11   hammer toe & plantar fascitis   MOUTH SURGERY     bone graft   TONSILLECTOMY     VEIN LIGATION AND STRIPPING      MEDS:   Current Outpatient Medications on File Prior to Visit  Medication Sig Dispense Refill   acetaminophen (TYLENOL) 500 MG tablet Take 500 mg by mouth every 6 (six) hours as needed for headache.     Bioflavonoid Products (BIOFLEX PO) Take by mouth.     Calcium Carbonate-Vitamin D (CALCIUM + D PO) Take 1 tablet by  mouth daily.     cetirizine (ZYRTEC) 10 MG tablet Take 1 tablet (10 mg total) by mouth daily. 30 tablet 4   CINNAMON PO Take by mouth.     Docusate Calcium (STOOL SOFTENER PO) Apply 3 capsules topically at bedtime.      EPINEPHrine 0.3 mg/0.3 mL IJ SOAJ injection Inject into the muscle.     estradiol (ESTRACE) 0.1 MG/GM vaginal cream 1 gram vaginally twice weekly 42.5 g 2   fluticasone (FLONASE) 50 MCG/ACT nasal spray Place 1 spray into both nostrils daily as needed for allergies.      ibuprofen (ADVIL) 200 MG tablet Take 200 mg by mouth every 6 (six) hours as needed for headache or moderate pain.     Omega-3 Fatty Acids (FISH OIL PO) Take 2 capsules by mouth every evening.      venlafaxine XR (EFFEXOR-XR) 150 MG 24 hr capsule Take 150 mg by mouth daily.   0   No current facility-administered medications on file prior to visit.    ALLERGIES: Other, Ciprofloxacin, Macrobid [nitrofurantoin], and Septra [sulfamethoxazole-trimethoprim]  Family History  Problem Relation Age of Onset   Colon cancer Mother    Hypertension Father    Anxiety disorder Father    Diabetes Maternal Grandfather    Hypertension Maternal Grandmother    COPD Maternal Aunt    Stroke  Maternal Aunt    Lung cancer Maternal Aunt    Immunodeficiency Sister    Allergic rhinitis Neg Hx    Angioedema Neg Hx    Asthma Neg Hx    Atopy Neg Hx    Eczema Neg Hx    Urticaria Neg Hx      Review of Systems  Constitutional: Negative.   HENT: Negative.    Eyes: Negative.   Respiratory: Negative.    Cardiovascular: Negative.   Gastrointestinal: Negative.   Endocrine: Negative.   Genitourinary:        Vaginal irritation, dryness & some itching  Musculoskeletal: Negative.   Skin: Negative.   Allergic/Immunologic: Negative.   Neurological: Negative.   Hematological: Negative.   Psychiatric/Behavioral: Negative.     PHYSICAL EXAMINATION:    BP 120/70   Pulse 82   Resp 16   Wt 181 lb (82.1 kg)   LMP 01/18/2012    BMI 27.32 kg/m     General appearance: alert, cooperative, no acute distress Lymph:  no inguinal LAD noted  Pelvic: External genitalia:  no lesions, redness, atrophy              Urethra:  normal appearing urethra with no masses, tenderness or lesions              Bartholins and Skenes: normal                 Vagina: atrophy, redness, white discharge, pt very tender with exam, difficulty tolerating adolescent speculum              Cervix: no lesions   Wet mount: + yeast, neg clue, neg trich UA: trace blood, appears to be contaminated specimen         Chaperone, Joy, CMA, was present for exam.  Assessment/Plan: Vaginal burning - Plan: WET PREP FOR TRICH, YEAST, CLUE  Pain with urination - Plan: Urinalysis,Complete w/RFL Culture  Candidiasis of vulva and vagina - Plan: fluconazole (DIFLUCAN) 150 MG tablet

## 2021-01-06 NOTE — Patient Instructions (Addendum)
Replens vaginal moisturizer or coconut oil (make suppositories)  Check out this website:  [id number]   Vaginal Yeast Infection, Adult  Vaginal yeast infection is a condition that causes vaginal discharge as well as soreness, swelling, and redness (inflammation) of the vagina. This is a common condition. Some women get this infectionfrequently. What are the causes? This condition is caused by a change in the normal balance of the yeast (candida) and bacteria that live in the vagina. This change causes an overgrowth ofyeast, which causes the inflammation. What increases the risk? The condition is more likely to develop in women who: Take antibiotic medicines. Have diabetes. Take birth control pills. Are pregnant. Douche often. Have a weak body defense system (immune system). Have been taking steroid medicines for a long time. Frequently wear tight clothing. What are the signs or symptoms? Symptoms of this condition include: White, thick, creamy vaginal discharge. Swelling, itching, redness, and irritation of the vagina. The lips of the vagina (vulva) may be affected as well. Pain or a burning feeling while urinating. Pain during sex. How is this diagnosed? This condition is diagnosed based on: Your medical history. A physical exam. A pelvic exam. Your health care provider will examine a sample of your vaginal discharge under a microscope. Your health care provider may send this sample for testing to confirm the diagnosis. How is this treated? This condition is treated with medicine. Medicines may be over-the-counter or prescription. You may be told to use one or more of the following: Medicine that is taken by mouth (orally). Medicine that is applied as a cream (topically). Medicine that is inserted directly into the vagina (suppository). Follow these instructions at home:  Lifestyle Do not have sex until your health care provider  approves. Tell your sex partner that you have a yeast infection. That person should go to his or her health care provider and ask if they should also be treated. Do not wear tight clothes, such as pantyhose or tight pants. Wear breathable cotton underwear. General instructions Take or apply over-the-counter and prescription medicines only as told by your health care provider. Eat more yogurt. This may help to keep your yeast infection from returning. Do not use tampons until your health care provider approves. Try taking a sitz bath to help with discomfort. This is a warm water bath that is taken while you are sitting down. The water should only come up to your hips and should cover your buttocks. Do this 3-4 times per day or as told by your health care provider. Do not douche. If you have diabetes, keep your blood sugar levels under control. Keep all follow-up visits as told by your health care provider. This is important. Contact a health care provider if: You have a fever. Your symptoms go away and then return. Your symptoms do not get better with treatment. Your symptoms get worse. You have new symptoms. You develop blisters in or around your vagina. You have blood coming from your vagina and it is not your menstrual period. You develop pain in your abdomen. Summary Vaginal yeast infection is a condition that causes discharge as well as soreness, swelling, and redness (inflammation) of the vagina. This condition is treated with medicine. Medicines may be over-the-counter or prescription. Take or apply over-the-counter and prescription medicines only as told by your health care provider. Do not douche. Do not have sex or use tampons until your health care provider approves. Contact a health care provider if your symptoms do not get better  with treatment or your symptoms go away and then return. This information is not intended to replace advice given to you by your health care provider.  Make sure you discuss any questions you have with your healthcare provider. Document Revised: 06/26/2020 Document Reviewed: 11/22/2017 Elsevier Patient Education  [place].   Genitourinary Syndrome of Menopause (GSM)  Genitourinary Syndrome of menopause is a term that describes the spectrum of changes caused by the lack of estrogen in menopause. Common Signs and Symptoms Include: Vaginal dryness, irritation/burning/itching. Abnormal discharge, vaginal pressure, pain with sex, decreased sexual arousal/desire, difficulty achieving orgasm, pain with urination, urgency, incontinence of urine, recurrent bladder infections, urethral prolapse and others. Diagnosis can usually be made with history and pelvic exam. Other testing may be considered to rule out other potential abnormalities. Symptoms can be progressive and chronic. The goal of treatment is symptom relief.  There are several different approaches to improving symptoms:  Vaginal Moisturizers and lubricants: Glycerin Moisturizer (Replens), Silicone based products (Uberlube), water-based products (Restore Moisturizing Gel by [employer]), Hyaluronic Acid vaginal products (such as HYALO GYN), and natural oils such as Olive Oil, Almond Oil and Coconut Oil (Since coconut oil comes in solid preparations, you can form them into suppositories and insert into the vagina as needed). These products are Over-the-Counter (OTC) at [employer] and retail stores and DO NOT contain hormones.  How to use vaginal and vulvar moisturizers Many vaginal moisturizers come with an applicator. You will need fill the applicator with the moisturizer and then insert it carefully into your vagina. You can put lubricant on the tip of the applicator to make it easier to insert into your vagina. You can also use vaginal moisturizers on your vulvar tissues, including your inner and outer labia (the folds of skin around your vagina). To put these moisturizers on your  vulva, put a small amount (pea or grape size) of moisturizer on your finger. Then, massage the moisturizer into your vaginal opening and onto your labia. If you recently finished cancer treatment, or are going through sudden menopause, you may need to use the moisturizers 3 to 5 times a week to relieve your symptoms. Vaginal and vulvar moisturizers should be used before you go to bed, so the product can be fully absorbed.  Lifestyle modifications: smoking cessation, pelvic floor physical therapy, Kegel's exercises, vaginal dilators  Non-hormonal therapies: Lidocaine (topical anesthetic) to decrease sensitivity, Oral Ospemifeme (requires prescription), Laser therapy (Pros and Cons should be discussed with provider)  Hormones Therapies: For moderate to severe GSM, vaginal estrogen is considered the most effective treatment. It can be used in combination with vaginal moisturizers and lubricants. Estrogen is delivered in small doses in the vagina with various preparations available including creams, rings, and tablets. Estrogen therapy may be contraindicated with certain hormone sensitive cancers. Vaginal DHEA and Testosterone have shown effectiveness in some cases to help with relieving vaginal atrophy and have shown mixed results with libido. Risks and benefits should be discussed prior to starting therapy.

## 2021-01-08 LAB — URINE CULTURE
MICRO NUMBER:: 12026822
Result:: NO GROWTH
SPECIMEN QUALITY:: ADEQUATE

## 2021-01-08 LAB — URINALYSIS, COMPLETE W/RFL CULTURE
Bilirubin Urine: NEGATIVE
Glucose, UA: NEGATIVE
Hyaline Cast: NONE SEEN /LPF
Ketones, ur: NEGATIVE
Leukocyte Esterase: NEGATIVE
Nitrites, Initial: NEGATIVE
Protein, ur: NEGATIVE
RBC / HPF: NONE SEEN /HPF (ref 0–2)
Specific Gravity, Urine: 1.01 (ref 1.001–1.035)
pH: 7 (ref 5.0–8.0)

## 2021-01-08 LAB — CULTURE INDICATED

## 2021-01-27 ENCOUNTER — Encounter: Payer: Self-pay | Admitting: Obstetrics & Gynecology

## 2021-01-27 ENCOUNTER — Ambulatory Visit (INDEPENDENT_AMBULATORY_CARE_PROVIDER_SITE_OTHER): Payer: BC Managed Care – PPO | Admitting: Obstetrics & Gynecology

## 2021-01-27 ENCOUNTER — Other Ambulatory Visit: Payer: Self-pay

## 2021-01-27 VITALS — BP 120/72 | HR 80 | Resp 14 | Ht 68.5 in | Wt 182.0 lb

## 2021-01-27 DIAGNOSIS — N949 Unspecified condition associated with female genital organs and menstrual cycle: Secondary | ICD-10-CM

## 2021-01-27 DIAGNOSIS — N9489 Other specified conditions associated with female genital organs and menstrual cycle: Secondary | ICD-10-CM

## 2021-01-27 LAB — WET PREP FOR TRICH, YEAST, CLUE

## 2021-01-27 MED ORDER — FLUCONAZOLE 150 MG PO TABS: 150.0000 mg | ORAL_TABLET | Freq: Every day | ORAL | 1 refills | Status: AC

## 2021-01-27 NOTE — Progress Notes (Signed)
    Kristin Bass Sep 21, 1960 [id number]        60 y.o.  G2P0A2  Married  RP: Vaginal burning  HPI: Treated for Yeast vaginitis with Fluconazole 1 tab PO x 2 at the end of June 2022.  Got better after treatment, but symptoms recurred now.  Vaginal burning with mild discharge.  Mild burning when passing urine.  No blood in urine.  Pain with IC.  Using Premarin cream.  No PMB.  BMs normal.  No fever.   OB History  Gravida Para Term Preterm AB Living  2       2 0  SAB IAB Ectopic Multiple Live Births      2        # Outcome Date GA Lbr Len/2nd Weight Sex Delivery Anes PTL Lv  2 Ectopic              Birth Comments: with twins  1 Ectopic              Birth Comments: with twins    Obstetric Comments  1 adopted children    Past medical history,surgical history, problem list, medications, allergies, family history and social history were all reviewed and documented in the EPIC chart.   Directed ROS with pertinent positives and negatives documented in the history of present illness/assessment and plan.  Exam:  Vitals:   01/27/21 0955  BP: 120/72  Pulse: 80  Resp: 14  Weight: 182 lb (82.6 kg)  Height: 5' 8.5" (1.74 m)   General appearance:  Normal  Abdomen: Normal  Gynecologic exam: Vulva normal.  Speculum (virgin speculum): Cervix/Vagina normal.  Mild vaginal discharge.  Wet prep done.    Wet prep: Neg  U/A: WNL with WBC 0-5, RBC Neg, Bacteria few.  Pending U. Culture.   Assessment/Plan:  60 y.o. G2P0020   1. Vaginal burning Recurrent vaginal burning with a negative wet prep today.  Recent yeast vaginitis treated with 2 fluconazole at the end of June.  Recommend probiotic daily and boric acid once a week to help restore her flora and prevent future yeast vaginitis.  If experiences worsening of symptoms, will start a treatment with fluconazole.  Usage reviewed and prescription sent to pharmacy.  Urine analysis was minimally perturbed.  Probably no bladder infection.  Will  wait on urine culture to decide if treatment needed. - WET PREP FOR TRICH, YEAST, CLUE - Urinalysis,Complete w/RFL Culture  Other orders - fluconazole (DIFLUCAN) 150 MG tablet; Take 1 tablet (150 mg total) by mouth daily for 3 days.   Kristin Bruins MD, 10:34 AM 01/27/2021

## 2021-01-27 NOTE — Telephone Encounter (Signed)
Patient would like OV for vaginal infection.

## 2021-01-28 NOTE — Telephone Encounter (Signed)
Office visit 01/27/21.

## 2021-01-29 LAB — URINALYSIS, COMPLETE W/RFL CULTURE
Bilirubin Urine: NEGATIVE
Glucose, UA: NEGATIVE
Hgb urine dipstick: NEGATIVE
Hyaline Cast: NONE SEEN /LPF
Ketones, ur: NEGATIVE
Leukocyte Esterase: NEGATIVE
Nitrites, Initial: NEGATIVE
Protein, ur: NEGATIVE
RBC / HPF: NONE SEEN /HPF (ref 0–2)
Specific Gravity, Urine: 1.01 (ref 1.001–1.035)
pH: 7.5 (ref 5.0–8.0)

## 2021-01-29 LAB — URINE CULTURE
MICRO NUMBER:: 12103107
SPECIMEN QUALITY:: ADEQUATE

## 2021-01-29 LAB — CULTURE INDICATED

## 2021-02-10 ENCOUNTER — Other Ambulatory Visit: Payer: Self-pay | Admitting: *Deleted

## 2021-02-10 MED ORDER — EPINEPHRINE 0.3 MG/0.3ML IJ SOAJ: 0.3000 mg | INTRAMUSCULAR | 1 refills | Status: DC | PRN

## 2021-02-14 ENCOUNTER — Telehealth: Payer: Self-pay | Admitting: Allergy

## 2021-02-14 NOTE — Telephone Encounter (Signed)
Please advise 

## 2021-02-14 NOTE — Telephone Encounter (Signed)
Patient was informed of Dr. Jeralyn Ruths recommendations and verbalized understanding

## 2021-02-14 NOTE — Telephone Encounter (Signed)
Patient states she has a funeral to attend this evening but she's afraid a lot of people there will not be wearing their mask. She will be wearing her N95 mask but wants to know if Dr. Nelva Bush thinks it will be safe for her to be in such a crowded, hot environment due to her conditions.

## 2021-02-17 DIAGNOSIS — D8944 Hereditary alpha tryptasemia: Secondary | ICD-10-CM

## 2021-02-17 HISTORY — DX: Hereditary alpha tryptasemia: D89.44

## 2021-06-05 DIAGNOSIS — D8944 Hereditary alpha tryptasemia: Secondary | ICD-10-CM | POA: Insufficient documentation

## 2021-07-02 ENCOUNTER — Emergency Department (HOSPITAL_COMMUNITY): Payer: No Typology Code available for payment source

## 2021-07-02 ENCOUNTER — Emergency Department (HOSPITAL_COMMUNITY)
Admission: EM | Admit: 2021-07-02 | Discharge: 2021-07-02 | Disposition: A | Discharge status: Home / Self Care | Payer: No Typology Code available for payment source | Attending: Emergency Medicine | Admitting: Emergency Medicine

## 2021-07-02 ENCOUNTER — Encounter (HOSPITAL_COMMUNITY): Payer: Self-pay | Admitting: Emergency Medicine

## 2021-07-02 DIAGNOSIS — S6000XA Contusion of unspecified finger without damage to nail, initial encounter: Secondary | ICD-10-CM

## 2021-07-02 DIAGNOSIS — S0990XA Unspecified injury of head, initial encounter: Secondary | ICD-10-CM | POA: Diagnosis present

## 2021-07-02 DIAGNOSIS — W010XXA Fall on same level from slipping, tripping and stumbling without subsequent striking against object, initial encounter: Secondary | ICD-10-CM

## 2021-07-02 DIAGNOSIS — S0083XA Contusion of other part of head, initial encounter: Secondary | ICD-10-CM | POA: Diagnosis not present

## 2021-07-02 DIAGNOSIS — S60416A Abrasion of right little finger, initial encounter: Secondary | ICD-10-CM | POA: Insufficient documentation

## 2021-07-02 DIAGNOSIS — Z87891 Personal history of nicotine dependence: Secondary | ICD-10-CM | POA: Insufficient documentation

## 2021-07-02 DIAGNOSIS — T148XXA Other injury of unspecified body region, initial encounter: Secondary | ICD-10-CM

## 2021-07-02 DIAGNOSIS — W06XXXA Fall from bed, initial encounter: Secondary | ICD-10-CM | POA: Diagnosis not present

## 2021-07-02 MED ORDER — ACETAMINOPHEN 500 MG PO TABS
1000.0000 mg | ORAL_TABLET | Freq: Once | ORAL | Status: AC
  Administered 2021-07-02: 20:00:00 1000 mg via ORAL
  Filled 2021-07-02: qty 2

## 2021-07-02 MED ORDER — BACITRACIN ZINC 500 UNIT/GM EX OINT
TOPICAL_OINTMENT | Freq: Two times a day (BID) | CUTANEOUS | Status: DC
  Filled 2021-07-02: qty 0.9

## 2021-07-02 NOTE — ED Triage Notes (Signed)
Per EMS-fell down 6-7 concrete stairs today-hit head, no LOC-hematoma to left forehead and having left wrist pain

## 2021-07-02 NOTE — Discharge Instructions (Addendum)
It was our pleasure to provide your ER care today - we hope that you feel better.  Icepack/coldpack to sore area. Keep abrasion very clean.   Take acetaminophen or ibuprofen as need for pain.   Follow up with primary care doctor in 1-2 weeks if symptoms fail to improve/resolve.  Return to ER if worse, new symptoms, new/severe pain, trouble breathing, or other concern.

## 2021-07-02 NOTE — ED Provider Notes (Signed)
Emergency Medicine Provider Triage Evaluation Note  Kristin Bass , a 60 y.o. female  was evaluated in triage.  Pt complains of pain in head, bilateral hands esp 4th finger right hand 2/2 mechanical, non-syncopal fall earlier today. Patient not on blood thinners, denies LOC.  Denies dizziness, confusion, nausea, vomiting.  Patient was able to get up and walk after the incident.  Patient does not complain of any pain in her hips, legs, balance issues.  Review of Systems  Positive: Fall, head, hand injuries Negative: LOC  Physical Exam  BP 128/78    Pulse 72    Temp 98.4 F (36.9 C) (Oral)    Resp 16    LMP 01/18/2012    SpO2 100%  Gen:   Awake, no distress   Resp:  Normal effort  MSK:   Moves extremities without difficulty  Other:  CN3-12 intact, intact strenght bil ue/le, ttp left wrist, right 4th finger, large hematoma left forehead  Medical Decision Making  Medically screening exam initiated at 5:16 PM.  Appropriate orders placed.  Kristin Bass was informed that the remainder of the evaluation will be completed by another provider, this initial triage assessment does not replace that evaluation, and the importance of remaining in the ED until their evaluation is complete.  Fall, not on thinners   Anselmo Pickler, PA-C 07/02/21 1718    Lajean Saver, MD 07/02/21 [phone]

## 2021-07-02 NOTE — ED Provider Notes (Signed)
[facility] DEPT Provider Note   CSN: [id number] Arrival date & time: 07/02/21  1643     History Chief Complaint  Patient presents with   Lytle Michaels    Caisley Baxendale is a 60 y.o. female.  Pt s/p mechanical fall earlier today, states missed stepped towards bottom of steps, fell forward. Symptoms acute onset, episodic, moderate. Hit head/forehead, no loc. No severe headaches since. No neck or back pain. No radicular pain. No numbness/weakness. Contusion to left small finger, abrasion to area. Contusion/pain to right ring finger. Denies chest pain or sob. No abd pain or nv. Mechanical fall, no faintness or dizziness prior to fall. No anticoag use. No pain meds/meds this evening. Indicates tetanus is up to date. Has been ambulatory post fall.   The history is provided by the patient and medical records.  Fall Pertinent negatives include no chest pain, no abdominal pain, no headaches and no shortness of breath.      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   Sudden hearing loss, left 06/10/2016   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   Radiculopathy of lumbosacral region 01/06/2015    Past Surgical History:  Procedure Laterality Date   BREAST CYST EXCISION Left    CYSTECTOMY     from neck   DILATION AND CURETTAGE OF UTERUS     times 2   FOOT SURGERY Left 11/11   hammer toe  & plantar fascitis   MOUTH SURGERY     bone graft   TONSILLECTOMY     VEIN LIGATION AND STRIPPING       OB History     Gravida  2   Para      Term      Preterm      AB  2   Living  0      SAB      IAB      Ectopic  2   Multiple      Live Births           Obstetric Comments  1 adopted children         Family History  Problem Relation Age of Onset   Colon cancer Mother    Hypertension Father    Anxiety disorder Father    Diabetes Maternal Grandfather    Hypertension Maternal Grandmother    COPD Maternal Aunt    Stroke Maternal Aunt    Lung cancer Maternal Aunt    Immunodeficiency Sister    Allergic rhinitis Neg Hx    Angioedema Neg Hx    Asthma Neg Hx    Atopy Neg Hx    Eczema Neg Hx    Urticaria Neg Hx     Social History   Tobacco Use   Smoking status: Former   Smokeless tobacco: Never  Media planner  Vaping Use: Never used  Substance Use Topics   Alcohol use: No   Drug use: No    Home Medications Prior to Admission medications   Medication Sig Start Date End Date Taking? Authorizing Provider  acetaminophen (TYLENOL) 500 MG tablet Take 500 mg by mouth every 6 (six) hours as needed for headache.    [provider]  Bioflavonoid Products (BIOFLEX PO) Take by mouth.    [provider]  Calcium Carbonate-Vitamin D (CALCIUM + D PO) Take 1 tablet by mouth daily.    [provider]  cetirizine (ZYRTEC) 10 MG tablet Take 1 tablet (10 mg total) by mouth daily. 12/09/20   Kennith Gain, MD  CINNAMON PO Take by mouth.    [provider]  Docusate Calcium (STOOL SOFTENER PO) Apply 3 capsules topically at bedtime.     [provider]  EPINEPHrine 0.3 mg/0.3 mL IJ SOAJ injection Inject 0.3 mg into the muscle as needed for anaphylaxis. 02/10/21   Kennith Gain, MD  estradiol (ESTRACE) 0.1 MG/GM vaginal cream 1 gram vaginally twice weekly 11/04/20   Karma Ganja, NP  fluticasone  [facility]) 50 MCG/ACT nasal spray Place 1 spray into both nostrils daily as needed for allergies.  03/06/19   [provider]  ibuprofen (ADVIL) 200 MG tablet Take 200 mg by mouth every 6 (six) hours as needed for headache or moderate pain.    [provider]  Omega-3 Fatty Acids (FISH OIL PO) Take 2 capsules by mouth every evening.     [provider]  venlafaxine XR (EFFEXOR-XR) 150 MG 24 hr capsule Take 150 mg by mouth daily.  12/28/16   [provider]    Allergies    Other, Ciprofloxacin, Macrobid [nitrofurantoin], and Septra [sulfamethoxazole-trimethoprim]  Review of Systems   Review of Systems  Constitutional:  Negative for fever.  HENT:  Negative for nosebleeds.   Eyes:  Negative for redness.  Respiratory:  Negative for shortness of breath.   Cardiovascular:  Negative for chest pain.  Gastrointestinal:  Negative for abdominal pain and vomiting.  Genitourinary:  Negative for flank pain.  Musculoskeletal:  Negative for back pain and neck pain.  Skin:  Negative for rash.  Neurological:  Negative for numbness and headaches.  Hematological:  Does not bruise/bleed easily.  Psychiatric/Behavioral:  Negative for confusion.    Physical Exam Updated Vital Signs BP 128/78    Pulse 72    Temp 98.4 F (36.9 C) (Oral)    Resp 16    LMP 01/18/2012    SpO2 100%   Physical Exam Vitals and nursing note reviewed.  Constitutional:      Appearance: Normal appearance. She is well-developed.  HENT:     Head:     Comments: Contusion forehead. Facial bones/orbits grossly intact. No malocclusion.     Nose: Nose normal.     Mouth/Throat:     Mouth: Mucous membranes are moist.  Eyes:     General: No scleral icterus.    Conjunctiva/sclera: Conjunctivae normal.  Neck:     Trachea: No tracheal deviation.  Cardiovascular:     Rate and Rhythm: Normal rate and regular rhythm.     Pulses: Normal pulses.     Heart sounds: Normal heart sounds. No murmur heard.   No  friction rub. No gallop.  Pulmonary:     Effort: Pulmonary effort is normal. No respiratory distress.     Breath sounds: Normal breath sounds.  Chest:     Chest  wall: No tenderness.  Abdominal:     General: There is no distension.     Palpations: Abdomen is soft.     Tenderness: There is no abdominal tenderness.  Genitourinary:    Comments: No cva tenderness.  Musculoskeletal:        General: No swelling.     Cervical back: Neck supple. No rigidity or tenderness. No muscular tenderness.     Comments: CTLS spine, non tender, aligned, no step off. Tenderness right ring finger and left small finger - rings now removed. Intact rom/tendon fxn. Normal cap refill distally. Superficial abrasion to dorsal aspect left small finger - no fb seen or felt. Otherwise good rom bil ext without other focal bony tenderness. No scaphoid tenderness.   Skin:    General: Skin is warm and dry.     Findings: No rash.  Neurological:     Mental Status: She is alert.     Comments: Alert, speech normal. GCS 15. Motor/sens grossly intact bil. Steady gait.   Psychiatric:        Mood and Affect: Mood normal.    ED Results / Procedures / Treatments   Labs (all labs ordered are listed, but only abnormal results are displayed) Labs Reviewed - No data to display  EKG None  Radiology CT Head Wo Contrast  Result Date: 07/02/2021 CLINICAL DATA:  Fall down stairs. EXAM: CT HEAD WITHOUT CONTRAST CT CERVICAL SPINE WITHOUT CONTRAST TECHNIQUE: Multidetector CT imaging of the head and cervical spine was performed following the standard protocol without intravenous contrast. Multiplanar CT image reconstructions of the cervical spine were also generated. COMPARISON:  None. FINDINGS: CT HEAD FINDINGS Brain: No acute intracranial hemorrhage. No focal mass lesion. No CT evidence of acute infarction. No midline shift or mass effect. No hydrocephalus. Basilar cisterns are patent. Vascular: No hyperdense vessel or unexpected  calcification. Skull: Normal. Negative for fracture or focal lesion. Sinuses/Orbits: Paranasal sinuses and mastoid air cells are clear. Orbits are clear. Other: Shallow scalp hematoma over the LEFT frontal bone measures 9 mm in depth (image 17/series 2). No associated skull fracture. CT CERVICAL SPINE FINDINGS Alignment: Normal alignment of the cervical vertebral bodies. Skull base and vertebrae: Normal craniocervical junction. No loss of vertebral body height or disc height. Normal facet articulation. No evidence of fracture. Soft tissues and spinal canal: No prevertebral soft tissue swelling. No perispinal or epidural hematoma. Disc levels:  Unremarkable Upper chest: Clear Other: None IMPRESSION: 1. No intracranial trauma.  No intracranial hemorrhage. 2. LEFT frontal scalp hematoma.  No associated skull fracture. 3. No cervical spine fracture. Electronically Signed   By: Suzy Bouchard M.D.   On: 07/02/2021 17:59   CT Cervical Spine Wo Contrast  Result Date: 07/02/2021 CLINICAL DATA:  Fall down stairs. EXAM: CT HEAD WITHOUT CONTRAST CT CERVICAL SPINE WITHOUT CONTRAST TECHNIQUE: Multidetector CT imaging of the head and cervical spine was performed following the standard protocol without intravenous contrast. Multiplanar CT image reconstructions of the cervical spine were also generated. COMPARISON:  None. FINDINGS: CT HEAD FINDINGS Brain: No acute intracranial hemorrhage. No focal mass lesion. No CT evidence of acute infarction. No midline shift or mass effect. No hydrocephalus. Basilar cisterns are patent. Vascular: No hyperdense vessel or unexpected calcification. Skull: Normal. Negative for fracture or focal lesion. Sinuses/Orbits: Paranasal sinuses and mastoid air cells are clear. Orbits are clear. Other: Shallow scalp hematoma over the LEFT frontal bone measures 9 mm in depth (image 17/series 2). No associated skull fracture. CT CERVICAL SPINE FINDINGS  Alignment: Normal alignment of the cervical  vertebral bodies. Skull base and vertebrae: Normal craniocervical junction. No loss of vertebral body height or disc height. Normal facet articulation. No evidence of fracture. Soft tissues and spinal canal: No prevertebral soft tissue swelling. No perispinal or epidural hematoma. Disc levels:  Unremarkable Upper chest: Clear Other: None IMPRESSION: 1. No intracranial trauma.  No intracranial hemorrhage. 2. LEFT frontal scalp hematoma.  No associated skull fracture. 3. No cervical spine fracture. Electronically Signed   By: Suzy Bouchard M.D.   On: 07/02/2021 17:59   DG Hand Complete Left  Result Date: 07/02/2021 CLINICAL DATA:  Fall, pain in bilateral hands EXAM: RIGHT HAND - COMPLETE 3+ VIEW; LEFT HAND - COMPLETE 3+ VIEW COMPARISON:  None. FINDINGS: Right hand: There is no evidence of fracture or dislocation. There is no evidence of significant arthropathy or other focal bone abnormality. Mild soft tissue swelling about the proximal interphalangeal joint of the fifth digit. LEFT HAND: There is no evidence of fracture or dislocation. There is no evidence of significant arthropathy or other focal bone abnormality. Mild soft tissue swelling about the proximal interphalangeal joint of the fourth digit. IMPRESSION: 1.  No evidence of fracture or dislocation. 2. Mild soft tissue swelling of the fifth digit on the right and fourth digit on the left. Electronically Signed   By: Keane Police D.O.   On: 07/02/2021 18:10   DG Hand Complete Right  Result Date: 07/02/2021 CLINICAL DATA:  Fall, pain in bilateral hands EXAM: RIGHT HAND - COMPLETE 3+ VIEW; LEFT HAND - COMPLETE 3+ VIEW COMPARISON:  None. FINDINGS: Right hand: There is no evidence of fracture or dislocation. There is no evidence of significant arthropathy or other focal bone abnormality. Mild soft tissue swelling about the proximal interphalangeal joint of the fifth digit. LEFT HAND: There is no evidence of fracture or dislocation. There is no evidence  of significant arthropathy or other focal bone abnormality. Mild soft tissue swelling about the proximal interphalangeal joint of the fourth digit. IMPRESSION: 1.  No evidence of fracture or dislocation. 2. Mild soft tissue swelling of the fifth digit on the right and fourth digit on the left. Electronically Signed   By: Keane Police D.O.   On: 07/02/2021 18:10   CT Maxillofacial Wo Contrast  Result Date: 07/02/2021 CLINICAL DATA:  Blunt facial trauma EXAM: CT MAXILLOFACIAL WITHOUT CONTRAST TECHNIQUE: Multidetector CT imaging of the maxillofacial structures was performed. Multiplanar CT image reconstructions were also generated. COMPARISON:  None. FINDINGS: Osseous: No fracture or mandibular dislocation. No destructive process. Orbits: Negative. No traumatic or inflammatory finding. Sinuses: Trace amount of fluid in the left frontal sinus. Remaining paranasal sinuses are clear. Soft tissues: Left frontal/parietal soft tissue swelling and hematoma. Limited intracranial: No significant or unexpected finding. IMPRESSION: 1.  No evidence of fracture or dislocation. 2.  Left forehead and frontal hematoma. Electronically Signed   By: Keane Police D.O.   On: 07/02/2021 17:58    Procedures Procedures   Medications Ordered in ED Medications  acetaminophen (TYLENOL) tablet 1,000 mg (has no administration in time range)  bacitracin ointment (has no administration in time range)    ED Course  I have reviewed the triage vital signs and the nursing notes.  Pertinent labs & imaging results that were available during my care of the patient were reviewed by me and considered in my medical decision making (see chart for details).    MDM Rules/Calculators/A&P  Imaging studies ordered.   Reviewed nursing notes and prior charts for additional history.   Acetaminophen po.   Abrasion cleaned by staff, bacitracin and dressing.   Discussed xrays/imaging w pt.   Xrays  reviewed/interpreted by me - no fx.   CT reviewed/interpreted by me - no hem.  Pt appears stable for d/c.   Return precautions provided.   Final Clinical Impression(s) / ED Diagnoses Final diagnoses:  None    Rx / DC Orders ED Discharge Orders     None        Lajean Saver, MD 07/02/21 1919

## 2021-07-05 ENCOUNTER — Ambulatory Visit
Admission: EM | Admit: 2021-07-05 | Discharge: 2021-07-05 | Disposition: A | Discharge status: Home / Self Care | Payer: BC Managed Care – PPO | Attending: Internal Medicine | Admitting: Internal Medicine

## 2021-07-05 ENCOUNTER — Other Ambulatory Visit: Payer: Self-pay

## 2021-07-05 DIAGNOSIS — S0003XA Contusion of scalp, initial encounter: Secondary | ICD-10-CM | POA: Diagnosis not present

## 2021-07-05 DIAGNOSIS — S0012XA Contusion of left eyelid and periocular area, initial encounter: Secondary | ICD-10-CM

## 2021-07-05 NOTE — ED Provider Notes (Addendum)
[facility]    CSN: [id number] Arrival date & time: 07/05/21  0841      History   Chief Complaint Chief Complaint  Patient presents with   Facial Swelling    left    HPI Kristin Bass is a 60 y.o. female.   Patient presents with left eye bruising that started approximately 3 to 4 days ago.  Patient was seen in ED on 07/02/2021 after a fall that occurred where she landed on her face.  She had several CT scans performed that were negative for fracture.  She is following up in urgent care today due to persistent bruising around the left eye and due to recommendation from PCP.  She does have an appointment with PCP on Monday.  Denies any blurry vision or pain to the eye.  Denies any issues with extraocular movements.  She does have a slight watery drainage that is intermittent.  Denies any bloody drainage.  Patient also reports that she still has pain to the scalp from fall.  Also has mild intermittent headache that has been persistent since fall.  Patient also complaining of left rib pain that was not present when she originally presented to the ED but has developed since she left ED.  Patient reports that she thinks that she fell on her left side.  Denies any difficulty breathing or shortness of breath.    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   Sudden hearing loss, left 06/10/2016   Hereditary and idiopathic  peripheral neuropathy 01/06/2015   Radiculopathy of lumbosacral region 01/06/2015    Past Surgical History:  Procedure Laterality Date   BREAST CYST EXCISION Left    CYSTECTOMY     from neck   DILATION AND CURETTAGE OF UTERUS     times 2   FOOT SURGERY Left 11/11   hammer toe & plantar fascitis   MOUTH SURGERY     bone graft   TONSILLECTOMY     VEIN LIGATION AND STRIPPING      OB History     Gravida  2   Para      Term      Preterm      AB  2   Living  0      SAB      IAB      Ectopic  2   Multiple      Live Births           Obstetric Comments  1 adopted children          Home Medications    Prior to Admission medications   Medication Sig Start Date End Date Taking? Authorizing Provider  acetaminophen (TYLENOL) 500 MG tablet Take 500 mg by mouth every 6 (six) hours as needed for headache.    [provider]  Bioflavonoid  Products (BIOFLEX PO) Take by mouth.    [provider]  Calcium Carbonate-Vitamin D (CALCIUM + D PO) Take 1 tablet by mouth daily.    [provider]  cetirizine (ZYRTEC) 10 MG tablet Take 1 tablet (10 mg total) by mouth daily. 12/09/20   Kennith Gain, MD  CINNAMON PO Take by mouth.    [provider]  Docusate Calcium (STOOL SOFTENER PO) Apply 3 capsules topically at bedtime.     [provider]  EPINEPHrine 0.3 mg/0.3 mL IJ SOAJ injection Inject 0.3 mg into the muscle as needed for anaphylaxis. 02/10/21   Kennith Gain, MD  estradiol (ESTRACE) 0.1 MG/GM vaginal cream 1 gram vaginally twice weekly 11/04/20   Karma Ganja, NP  fluticasone [facility]) 50 MCG/ACT nasal spray Place 1 spray into both nostrils daily as needed for allergies.  03/06/19   [provider]  ibuprofen (ADVIL) 200 MG tablet Take 200 mg by mouth every 6 (six) hours as needed for headache or moderate pain.    [provider]  Omega-3 Fatty Acids (FISH OIL PO) Take 2 capsules by  mouth every evening.     [provider]  venlafaxine XR (EFFEXOR-XR) 150 MG 24 hr capsule Take 150 mg by mouth daily.  12/28/16   [provider]    Family History Family History  Problem Relation Age of Onset   Colon cancer Mother    Hypertension Father    Anxiety disorder Father    Diabetes Maternal Grandfather    Hypertension Maternal Grandmother    COPD Maternal Aunt    Stroke Maternal Aunt    Lung cancer Maternal Aunt    Immunodeficiency Sister    Allergic rhinitis Neg Hx    Angioedema Neg Hx    Asthma Neg Hx    Atopy Neg Hx    Eczema Neg Hx    Urticaria Neg Hx     Social History Social History   Tobacco Use   Smoking status: Former   Smokeless tobacco: Never  Scientific laboratory technician Use: Never used  Substance Use Topics   Alcohol use: No   Drug use: No     Allergies   Other, Ciprofloxacin, Macrobid [nitrofurantoin], and Septra [sulfamethoxazole-trimethoprim]   Review of Systems Review of Systems Per HPI  Physical Exam Triage Vital Signs ED Triage Vitals  Enc Vitals Group     BP 07/05/21 0911 119/83     Pulse Rate 07/05/21 0911 76     Resp 07/05/21 0911 18     Temp 07/05/21 0911 98.2 F (36.8 C)     Temp Source 07/05/21 0911 Oral     SpO2 07/05/21 0911 98 %     Weight --      Height --      Head Circumference --      Peak Flow --      Pain Score 07/05/21 0915 0     Pain Loc --      Pain Edu? --      Excl. in [place]? --    No data found.  Updated Vital Signs BP 119/83 (BP Location: Left Arm)    Pulse 76    Temp 98.2 F (36.8 C) (Oral)    Resp 18    LMP 01/18/2012    SpO2 98%   Visual Acuity Right Eye Distance: 20/20 Left Eye Distance: 20/13 Bilateral Distance: 20/13 (Patient wearing glasses during exam)  Right Eye Near:   Left Eye Near:  Bilateral Near:     Physical Exam Constitutional:      General: She is not in acute distress.    Appearance: Normal appearance. She is not toxic-appearing or diaphoretic.  HENT:      Head: Normocephalic and atraumatic.  Eyes:     General: Vision grossly intact. Gaze aligned appropriately.        Right eye: No foreign body, discharge or hordeolum.        Left eye: No foreign body, discharge or hordeolum.     Extraocular Movements: Extraocular movements intact.     Conjunctiva/sclera: Conjunctivae normal.     Pupils: Pupils are equal, round, and reactive to light.     Comments: Patient has circumferential bruising and mild swelling noted to the left eyelids.  No abnormalities noted to cornea, sclera, conjunctivae.  No bleeding noted.  No discharge noted.  No issues with extraocular movements.     Pulmonary:     Effort: Pulmonary effort is normal.  Skin:    Comments: Patient has healing hematoma to left scalp.  Neurological:     General: No focal deficit present.     Mental Status: She is alert and oriented to person, place, and time. Mental status is at baseline.     Cranial Nerves: Cranial nerves 2-12 are intact.     Sensory: Sensation is intact.     Motor: Motor function is intact.     Coordination: Coordination is intact.     Gait: Gait is intact.  Psychiatric:        Mood and Affect: Mood normal.        Behavior: Behavior normal.        Thought Content: Thought content normal.        Judgment: Judgment normal.     UC Treatments / Results  Labs (all labs ordered are listed, but only abnormal results are displayed) Labs Reviewed - No data to display  EKG   Radiology No results found.  Procedures Procedures (including critical care time)  Medications Ordered in UC Medications - No data to display  Initial Impression / Assessment and Plan / UC Course  I have reviewed the triage vital signs and the nursing notes.  Pertinent labs & imaging results that were available during my care of the patient were reviewed by me and considered in my medical decision making (see chart for details).    No red flags or no worrisome etiologies or complications  noted to left eye on physical exam.  Patient does have hematoma to left eye that should reabsorb on its own. Neuro exam normal.  Discussed this with patient.  Discussed ice application as well.  CT scans were normal at ED so do not think that further imaging is necessary for head.  Visual acuity normal as well.  Suggested left rib x-ray due to left rib pain after fall but patient declined.  Risks associated with not doing rib x-ray were discussed with patient.  Patient voiced understanding.  Patient to follow-up with PCP for further evaluation and management.  Discussed strict return precautions.  Patient verbalized understanding and was agreeable with plan. Final Clinical Impressions(s) / UC Diagnoses   Final diagnoses:  Black eye of left side, initial encounter  Hematoma of scalp, initial encounter     Discharge Instructions      The bruising should reabsorb on its own.  Please use cool compresses to help facilitate this.  Go to the emergency department if you develop blurry vision  or pain to the eye.  Follow-up with primary care doctor.    ED Prescriptions   None    PDMP not reviewed this encounter.   Teodora Medici, [place] 07/05/21 [place], [place], [place] 07/05/21 [phone]

## 2021-07-05 NOTE — Discharge Instructions (Signed)
The bruising should reabsorb on its own.  Please use cool compresses to help facilitate this.  Go to the emergency department if you develop blurry vision or pain to the eye.  Follow-up with primary care doctor.

## 2021-07-05 NOTE — ED Triage Notes (Signed)
12/14 patient was worked up at the ED for a fall. Pt reports that her scans were fine, but that her left eye is worse with swelling and discoloration. Confirms eye drainage. No decrease in visual acuity.  PCP appointment scheduled Monday.

## 2021-08-04 DIAGNOSIS — M79644 Pain in right finger(s): Secondary | ICD-10-CM | POA: Insufficient documentation

## 2021-08-05 DIAGNOSIS — M25532 Pain in left wrist: Secondary | ICD-10-CM | POA: Insufficient documentation

## 2021-08-07 DIAGNOSIS — S63599A Other specified sprain of unspecified wrist, initial encounter: Secondary | ICD-10-CM | POA: Insufficient documentation

## 2021-08-16 ENCOUNTER — Encounter: Payer: Self-pay | Admitting: Obstetrics & Gynecology

## 2021-08-18 ENCOUNTER — Encounter: Payer: Self-pay | Admitting: Neurology

## 2021-09-02 ENCOUNTER — Other Ambulatory Visit: Payer: Self-pay

## 2021-09-02 ENCOUNTER — Emergency Department (HOSPITAL_COMMUNITY)
Admission: EM | Admit: 2021-09-02 | Discharge: 2021-09-03 | Disposition: A | Discharge status: Home / Self Care | Payer: BC Managed Care – PPO | Attending: Emergency Medicine | Admitting: Emergency Medicine

## 2021-09-02 ENCOUNTER — Encounter (HOSPITAL_COMMUNITY): Payer: Self-pay

## 2021-09-02 ENCOUNTER — Emergency Department (HOSPITAL_COMMUNITY): Payer: BC Managed Care – PPO

## 2021-09-02 DIAGNOSIS — M79672 Pain in left foot: Secondary | ICD-10-CM | POA: Insufficient documentation

## 2021-09-02 DIAGNOSIS — R42 Dizziness and giddiness: Secondary | ICD-10-CM | POA: Insufficient documentation

## 2021-09-02 DIAGNOSIS — R791 Abnormal coagulation profile: Secondary | ICD-10-CM | POA: Insufficient documentation

## 2021-09-02 DIAGNOSIS — Z79899 Other long term (current) drug therapy: Secondary | ICD-10-CM | POA: Diagnosis not present

## 2021-09-02 LAB — CBC WITH DIFFERENTIAL/PLATELET
Abs Immature Granulocytes: 0.03 10*3/uL (ref 0.00–0.07)
Basophils Absolute: 0 10*3/uL (ref 0.0–0.1)
Basophils Relative: 0 %
Eosinophils Absolute: 0 10*3/uL (ref 0.0–0.5)
Eosinophils Relative: 0 %
HCT: 40 % (ref 36.0–46.0)
Hemoglobin: 13.1 g/dL (ref 12.0–15.0)
Immature Granulocytes: 0 %
Lymphocytes Relative: 8 %
Lymphs Abs: 0.9 10*3/uL (ref 0.7–4.0)
MCH: 32.1 pg (ref 26.0–34.0)
MCHC: 32.8 g/dL (ref 30.0–36.0)
MCV: 98 fL (ref 80.0–100.0)
Monocytes Absolute: 0.9 10*3/uL (ref 0.1–1.0)
Monocytes Relative: 7 %
Neutro Abs: 10.1 10*3/uL — ABNORMAL HIGH (ref 1.7–7.7)
Neutrophils Relative %: 85 %
Platelets: 200 10*3/uL (ref 150–400)
RBC: 4.08 MIL/uL (ref 3.87–5.11)
RDW: 12.9 % (ref 11.5–15.5)
WBC: 11.9 10*3/uL — ABNORMAL HIGH (ref 4.0–10.5)
nRBC: 0 % (ref 0.0–0.2)

## 2021-09-02 LAB — BASIC METABOLIC PANEL
Anion gap: 6 (ref 5–15)
BUN: 20 mg/dL (ref 6–20)
CO2: 26 mmol/L (ref 22–32)
Calcium: 9.2 mg/dL (ref 8.9–10.3)
Chloride: 103 mmol/L (ref 98–111)
Creatinine, Ser: 0.64 mg/dL (ref 0.44–1.00)
GFR, Estimated: >60 mL/min (ref >60)
Glucose, Bld: 116 mg/dL — ABNORMAL HIGH (ref 70–99)
Potassium: 4.4 mmol/L (ref 3.5–5.1)
Sodium: 135 mmol/L (ref 135–145)

## 2021-09-02 MED ORDER — IBUPROFEN 200 MG PO TABS
600.0000 mg | ORAL_TABLET | Freq: Once | ORAL | Status: AC
  Administered 2021-09-02: 600 mg via ORAL
  Filled 2021-09-02: qty 3

## 2021-09-02 NOTE — ED Provider Triage Note (Addendum)
Emergency Medicine Provider Triage Evaluation Note  Kristin Bass , a 61 y.o. female  was evaluated in triage.  Pt complains of left foot pain onset today.  Patient states she was heading to a work-related appointment when she suddenly developed left foot pain.  Patient was evaluated at [facility] prior to arrival and states was instructed to come to the emergency room for further evaluation.  States her x-rays in the urgent care were negative. While in the triage room patient complained of lightheadedness and feeling hot all over.  Patient has history of hereditary alpha tryptesemia.  She has EpiPen on hand for anaphylactic symptoms.  Patient currently is without concern for anaphylaxis.  Denies throat swelling, difficulty breathing.  O2 sats 97%.  Soft blood pressure.  We will keep in triage room and monitor.  Will order basic blood work and left foot x-ray.  Review of Systems  Positive: As above Negative: As above  Physical Exam  BP 91/60 (BP Location: Left Arm)    Pulse 74    Temp (!) 97.5 F (36.4 C) (Oral)    Resp (!) 22    LMP 01/18/2012    SpO2 97%  Gen:   Awake, no distress   Resp:  Normal effort  MSK:   Moves extremities without difficulty  Other:  Left foot diffusely tender.  Left DP pulse palpable.  Brisk cap refill on left foot.  Medical Decision Making  Medically screening exam initiated at 7:51 PM.  Appropriate orders placed.  Kristin Bass was informed that the remainder of the evaluation will be completed by another provider, this initial triage assessment does not replace that evaluation, and the importance of remaining in the ED until their evaluation is complete.  Patient is refusing left foot x-ray.  States this was done at the urgent care.  Patient asked husband to send paperwork from urgent care however husband unable to have this sent over for her.  Discussed with patient to have ultrasound scheduled and done tomorrow.  Patient stating she is in significant pain and wants  blood work done here in the emergency room to evaluate for infection.   Evlyn Courier, PA-C 09/02/21 1955    Evlyn Courier, PA-C 09/02/21 2220

## 2021-09-02 NOTE — ED Triage Notes (Signed)
Pt reports with left foot pain and swelling since today around 4 pm. Pt also reports that she is having a reaction from some condition that she has.

## 2021-09-03 ENCOUNTER — Ambulatory Visit (HOSPITAL_BASED_OUTPATIENT_CLINIC_OR_DEPARTMENT_OTHER)
Admission: RE | Admit: 2021-09-03 | Discharge: 2021-09-03 | Disposition: A | Discharge status: Home / Self Care | Payer: BC Managed Care – PPO | Source: Ambulatory Visit | Attending: Emergency Medicine | Admitting: Emergency Medicine

## 2021-09-03 DIAGNOSIS — M79605 Pain in left leg: Secondary | ICD-10-CM

## 2021-09-03 DIAGNOSIS — M79672 Pain in left foot: Secondary | ICD-10-CM | POA: Diagnosis not present

## 2021-09-03 DIAGNOSIS — M79606 Pain in leg, unspecified: Secondary | ICD-10-CM | POA: Insufficient documentation

## 2021-09-03 LAB — D-DIMER, QUANTITATIVE: D-Dimer, Quant: 0.56 ug/mL-FEU — ABNORMAL HIGH (ref 0.00–0.50)

## 2021-09-03 MED ORDER — IBUPROFEN 800 MG PO TABS
800.0000 mg | ORAL_TABLET | Freq: Once | ORAL | Status: AC
Start: 2021-09-03 — End: 2021-09-03
  Administered 2021-09-03: 800 mg via ORAL
  Filled 2021-09-03: qty 1

## 2021-09-03 NOTE — ED Provider Notes (Addendum)
[facility] DEPT Provider Note   CSN: [id number] Arrival date & time: 09/02/21  1832     History  Chief Complaint  Patient presents with   Foot Pain    Kristin Bass is a 61 y.o. female.  HPI     This is a 61 year old female who presents with foot pain.  Patient reports that she was getting physical therapy earlier today at the orthopedist on her left arm when she developed acute left foot pain.  She states she had pain with ambulation and difficulty moving her foot secondary to pain.  This did improve with some ibuprofen.  She reports having x-rays done at the orthopedist office which we are reportedly negative.  She states she was referred here to "get blood work and rule out gout or blood clot."  Patient denies any history of gout.  Denies any new injury.  Denies calf pain, shortness of breath, chest pain.  Patient notes that while she was in triage she had an episode of feeling hot and flushed with lightheadedness.  She occasionally will get these "spells" which she attributes to her hereditary alpha tryptesemia.  Patient pulled up orthopedic notes for me to review.  Cute onset foot pain.  X-rays based on notes were negative.  Sent for further evaluation for possible DVT or vascular component of pain.  Gout also consideration.  Home Medications Prior to Admission medications   Medication Sig Start Date End Date Taking? Authorizing Provider  acetaminophen (TYLENOL) 500 MG tablet Take 500 mg by mouth every 6 (six) hours as needed for headache.    [provider]  Bioflavonoid Products (BIOFLEX PO) Take by mouth.    [provider]  Calcium Carbonate-Vitamin D (CALCIUM + D PO) Take 1 tablet by mouth daily.    [provider]  cetirizine (ZYRTEC) 10 MG tablet Take 1 tablet (10 mg total) by mouth daily. 12/09/20   Kennith Gain, MD  CINNAMON PO Take by mouth.    [provider]  Docusate Calcium (STOOL  SOFTENER PO) Apply 3 capsules topically at bedtime.     [provider]  EPINEPHrine 0.3 mg/0.3 mL IJ SOAJ injection Inject 0.3 mg into the muscle as needed for anaphylaxis. 02/10/21   Kennith Gain, MD  estradiol (ESTRACE) 0.1 MG/GM vaginal cream 1 gram vaginally twice weekly 11/04/20   Karma Ganja, NP  fluticasone [facility]) 50 MCG/ACT nasal spray Place 1 spray into both nostrils daily as needed for allergies.  03/06/19   [provider]  ibuprofen (ADVIL) 200 MG tablet Take 200 mg by mouth every 6 (six) hours as needed for headache or moderate pain.    [provider]  Omega-3 Fatty Acids (FISH OIL PO) Take 2 capsules by mouth every evening.     [provider]  venlafaxine XR (EFFEXOR-XR) 150 MG 24 hr capsule Take 150 mg by mouth daily.  12/28/16   [provider]      Allergies    Other, Ciprofloxacin, Macrobid [nitrofurantoin], and Septra [sulfamethoxazole-trimethoprim]    Review of Systems   Review of Systems  Constitutional:  Negative for fever.  Respiratory:  Negative for shortness of breath.   Cardiovascular:  Negative for chest pain.  Musculoskeletal:        Foot pain  All other systems reviewed and are negative.  Physical Exam Updated Vital Signs BP 119/78    Pulse 64    Temp (!) 97.5 F (36.4 C) (Oral)  Resp 15    LMP 01/18/2012    SpO2 100%  Physical Exam Vitals and nursing note reviewed.  Constitutional:      Appearance: She is well-developed. She is obese. She is not ill-appearing.  HENT:     Head: Normocephalic and atraumatic.     Mouth/Throat:     Mouth: Mucous membranes are moist.  Eyes:     Pupils: Pupils are equal, round, and reactive to light.  Cardiovascular:     Rate and Rhythm: Normal rate and regular rhythm.     Heart sounds: Normal heart sounds.  Pulmonary:     Effort: Pulmonary effort is normal. No respiratory distress.     Breath sounds: No wheezing.  Abdominal:     Palpations: Abdomen is  soft.  Musculoskeletal:        General: No tenderness.     Cervical back: Neck supple.     Right lower leg: No edema.     Left lower leg: No edema.     Comments: Tenderness palpation anterior foot just distal to the ankle joint, no lateral or medial malleolus tenderness, normal range of motion, no overlying skin changes, 2+ DP pulse, neurovascular intact distally, foot is warm and well-perfused  Skin:    General: Skin is warm and dry.  Neurological:     Mental Status: She is alert and oriented to person, place, and time.  Psychiatric:        Mood and Affect: Mood normal.    ED Results / Procedures / Treatments   Labs (all labs ordered are listed, but only abnormal results are displayed) Labs Reviewed  CBC WITH DIFFERENTIAL/PLATELET - Abnormal; Notable for the following components:      Result Value   WBC 11.9 (*)    Neutro Abs 10.1 (*)    All other components within normal limits  BASIC METABOLIC PANEL - Abnormal; Notable for the following components:   Glucose, Bld 116 (*)    All other components within normal limits  D-DIMER, QUANTITATIVE - Abnormal; Notable for the following components:   D-Dimer, Quant 0.56 (*)    All other components within normal limits    EKG None  Radiology No results found.  Procedures Procedures    Medications Ordered in ED Medications  ibuprofen (ADVIL) tablet 600 mg (600 mg Oral Given 09/02/21 2223)  ibuprofen (ADVIL) tablet 800 mg (800 mg Oral Given 09/03/21 0526)    ED Course/ Medical Decision Making/ A&P                           Medical Decision Making Amount and/or Complexity of Data Reviewed Labs: ordered. ECG/medicine tests: ordered.  Risk Prescription drug management.   This patient presents to the ED for concern of left foot pain, this involves an extensive number of treatment options, and is a complaint that carries with it a high risk of complications and morbidity.  The differential diagnosis includes contusion,  fracture, inflammation, less likely DVT or vascular insufficiency  MDM:    This is a 61 year old female who presents with left foot pain.  Reports that this was atraumatic and sudden in onset.  She was doing physical therapy at that time.  She reportedly had negative imaging at the orthopedist.  I cannot view these but patient refuses repeat imaging here.  She has point tenderness over the dorsum of the foot.  This is not at the ankle joint or at the MTP.  Would  make gout or inflammatory arthritis less likely although still a consideration.  She did have significant improvement with NSAID.  She is concerned about DVT.  She has no leg swelling or tenderness.  I sent a screening D-dimer.  This is just slightly elevated at 0.56.  We will schedule her for outpatient ultrasound imaging.  She can get that later this morning.  Given that this is very low likelihood, do not feel she needs Lovenox.  Discussed with patient reassuring work-up.  Continue anti-inflammatories. (Labs, imaging)  Labs: I Ordered, and personally interpreted labs.  The pertinent results include: Reassuring CBC and BMP  Imaging Studies ordered: I ordered imaging studies including N/A I independently visualized and interpreted imaging. I agree with the radiologist interpretation  Additional history obtained from patient.  External records from outside source obtained and reviewed including prior records  Critical Interventions: Ibuprofen  Consultations: I requested consultation with the NA,  and discussed lab and imaging findings as well as pertinent plan - they recommend: N/A  Cardiac Monitoring: The patient was maintained on a cardiac monitor.  I personally viewed and interpreted the cardiac monitored which showed an underlying rhythm of: Normal sinus rhythm  Reevaluation: After the interventions noted above, I reevaluated the patient and found that they have :improved   Considered admission for: N/A  Social  Determinants of Health: Lives independently  Disposition: Discharge  Co morbidities that complicate the patient evaluation  Past Medical History:  Diagnosis Date   Anxiety    History of degenerative disc disease    mild   Infertility, female    Mild scoliosis    Myofascial pain syndrome    Precancerous lesion    Urticaria    Wrist fracture      Medicines Meds ordered this encounter  Medications   ibuprofen (ADVIL) tablet 600 mg   ibuprofen (ADVIL) tablet 800 mg    I have reviewed the patients home medicines and have made adjustments as needed  Problem List / ED Course: Problem List Items Addressed This Visit   None Visit Diagnoses     Foot pain, left    -  Primary                   Final Clinical Impression(s) / ED Diagnoses Final diagnoses:  Foot pain, left    Rx / DC Orders ED Discharge Orders          Ordered    LE VENOUS        09/03/21 0454              Merryl Hacker, MD 09/03/21 0981    Merryl Hacker, MD 09/03/21 [id number]

## 2021-09-03 NOTE — Progress Notes (Signed)
Lower extremity venous has been completed.   Preliminary results in CV Proc.   Jinny Blossom Ty Buntrock 09/03/2021 11:17 AM

## 2021-09-03 NOTE — Discharge Instructions (Signed)
You were seen today for foot pain.  The cause is not obvious.  You could have a contusion.  Take anti-inflammatories as needed for pain.  Your screening test for blood clots was just slightly positive.  You will be scheduled for outpatient ultrasound imaging at [facility].

## 2021-09-04 DIAGNOSIS — M7989 Other specified soft tissue disorders: Secondary | ICD-10-CM | POA: Insufficient documentation

## 2021-09-09 ENCOUNTER — Encounter (HOSPITAL_COMMUNITY): Payer: Self-pay | Admitting: Student

## 2021-09-17 ENCOUNTER — Encounter: Payer: Self-pay | Admitting: Obstetrics & Gynecology

## 2021-09-17 DIAGNOSIS — N941 Unspecified dyspareunia: Secondary | ICD-10-CM

## 2021-09-18 DIAGNOSIS — M722 Plantar fascial fibromatosis: Secondary | ICD-10-CM | POA: Insufficient documentation

## 2021-09-18 DIAGNOSIS — S93602A Unspecified sprain of left foot, initial encounter: Secondary | ICD-10-CM | POA: Insufficient documentation

## 2021-09-18 MED ORDER — ESTRADIOL 0.1 MG/GM VA CREA: TOPICAL_CREAM | VAGINAL | 0 refills | Status: DC

## 2021-09-18 NOTE — Telephone Encounter (Signed)
Please schedule annual exam then route back to me so I can route to provider for refill. ?

## 2021-09-22 ENCOUNTER — Encounter: Payer: Self-pay | Admitting: Obstetrics & Gynecology

## 2021-09-22 DIAGNOSIS — N941 Unspecified dyspareunia: Secondary | ICD-10-CM

## 2021-09-23 MED ORDER — ESTRADIOL 0.1 MG/GM VA CREA: TOPICAL_CREAM | VAGINAL | 0 refills | Status: DC

## 2021-09-25 ENCOUNTER — Encounter: Payer: Self-pay | Admitting: Neurology

## 2021-10-08 ENCOUNTER — Ambulatory Visit: Payer: BC Managed Care – PPO | Admitting: Neurology

## 2021-10-27 ENCOUNTER — Ambulatory Visit: Payer: BC Managed Care – PPO | Admitting: Neurology

## 2021-11-18 IMAGING — CT CT CERVICAL SPINE W/O CM
3 of 4 series · 13 of 33 positions shown, 16 images · non-contrast
Comparison: CT 09/26/2018

CLINICAL DATA: Fell and hit head on bookshelf laceration to left
forehead

EXAM:
CT HEAD WITHOUT CONTRAST
CT CERVICAL SPINE WITHOUT CONTRAST
TECHNIQUE: Multidetector CT imaging of the head and cervical spine was
performed following the standard protocol without intravenous
contrast. Multiplanar CT image reconstructions of the cervical spine
were also generated.

[Series 4: c_spine 2.0 st · axial · 0.39mm/px · z∈[-261,-123]mm · 5 of 105 slices shown, 7 images]
[im 18/105  soft-tissue]
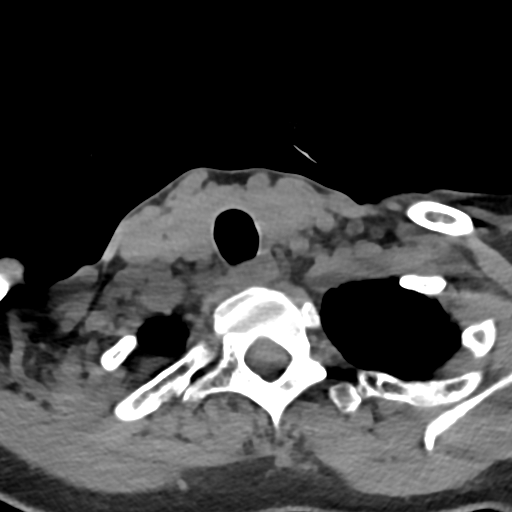
[im 18/105  bone]
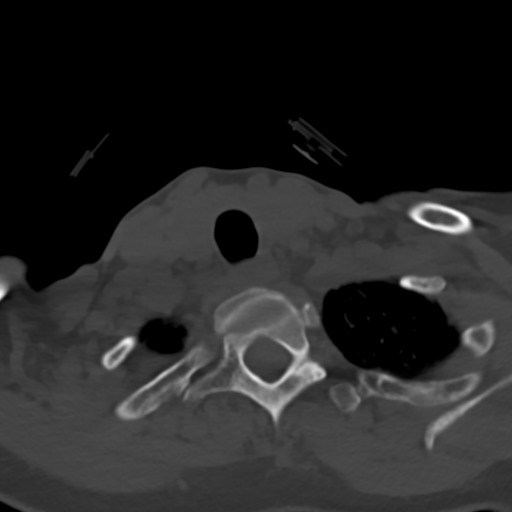
[im 35/105  bone]
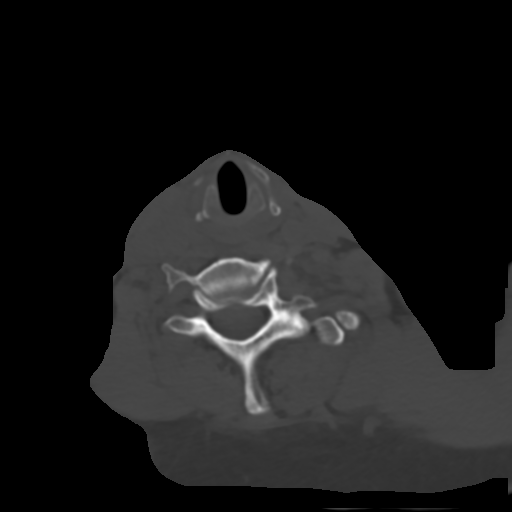
[im 53/105  bone]
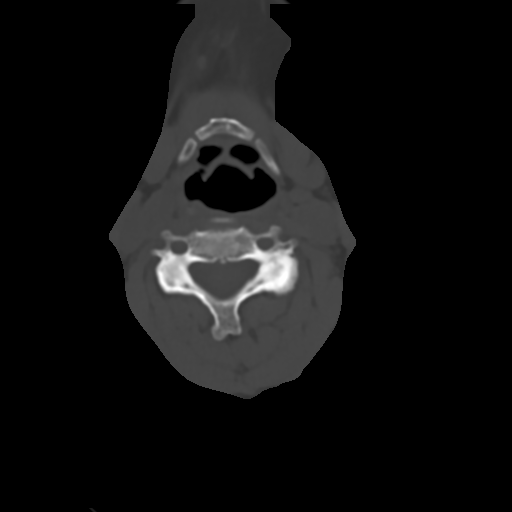
[im 70/105  bone]
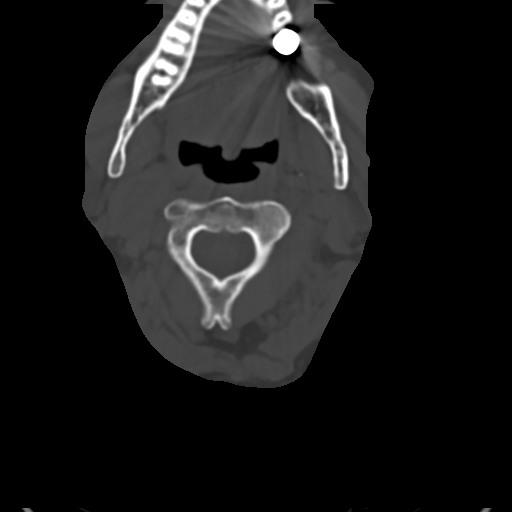
[im 87/105  soft-tissue]
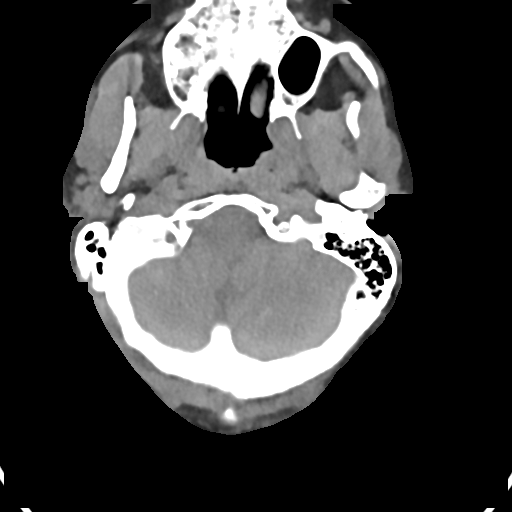
[im 87/105  bone]
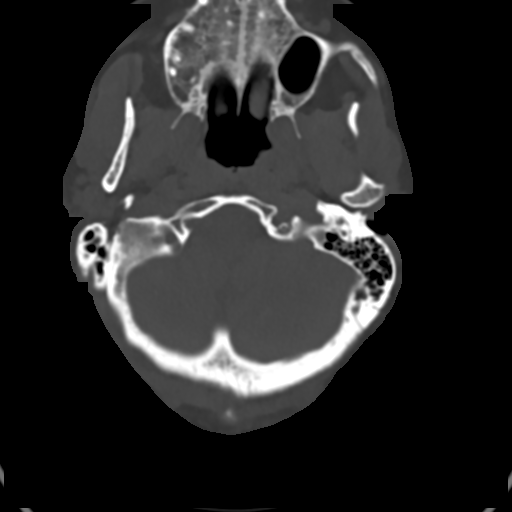

[Series 6: c_spine 2.0 sag bone · sagittal · 0.30mm/px · 5 of 61 slices shown, 6 images]
[im 21/61  bone]
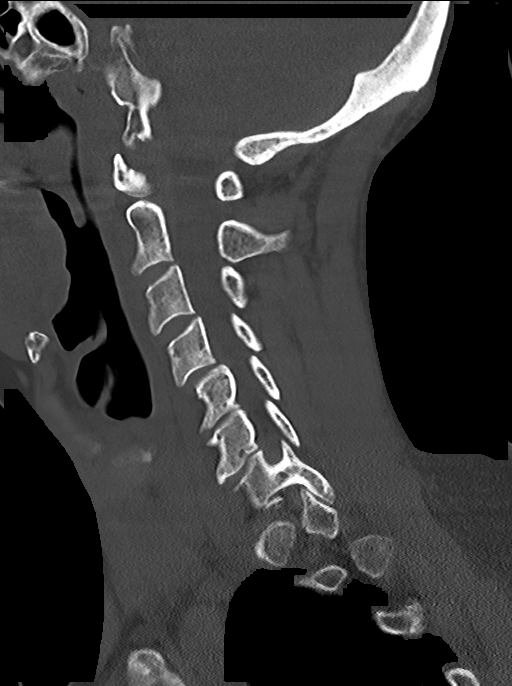
[im 26/61  bone]
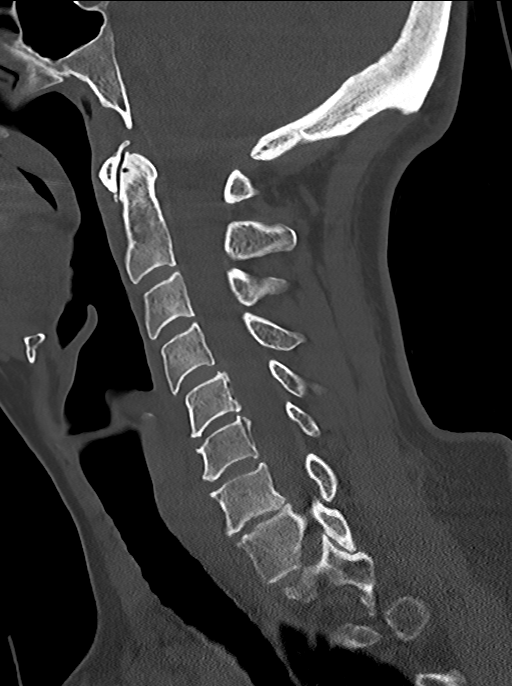
[im 31/61  soft-tissue]
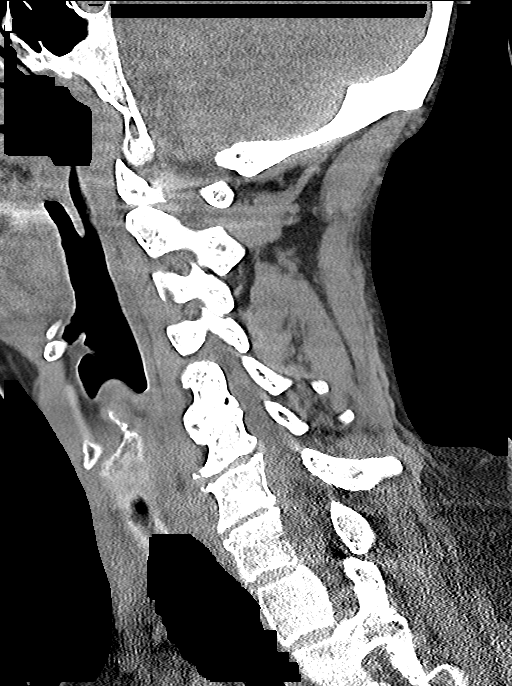
[im 31/61  bone]
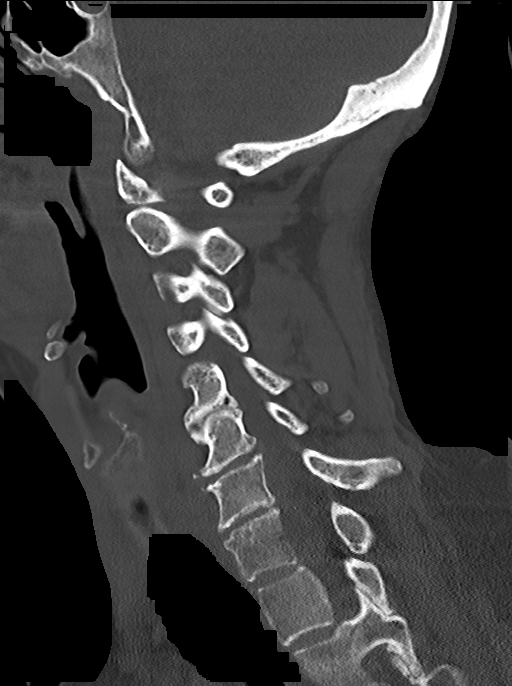
[im 36/61  bone]
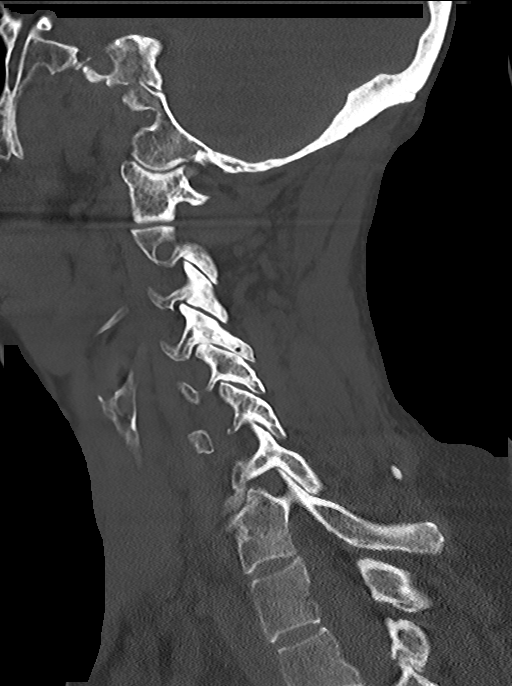
[im 41/61  bone]
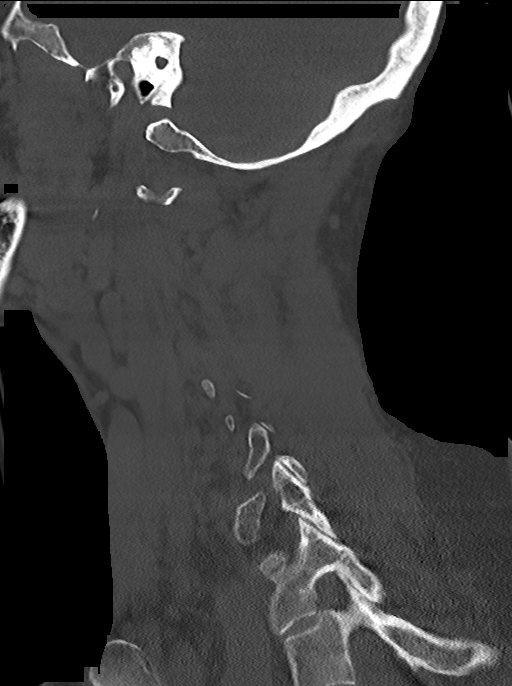

[Series 7: c_spine 2.0 cor bone · coronal · 0.30mm/px · 3 of 61 slices shown]
[im 13/61  bone]
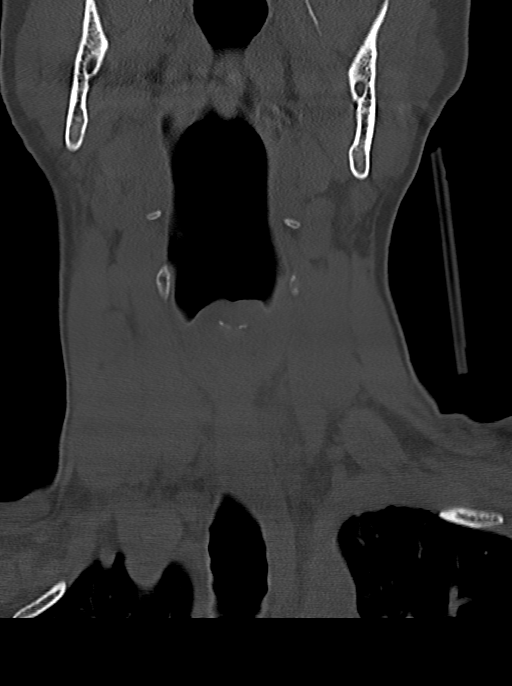
[im 25/61  bone]
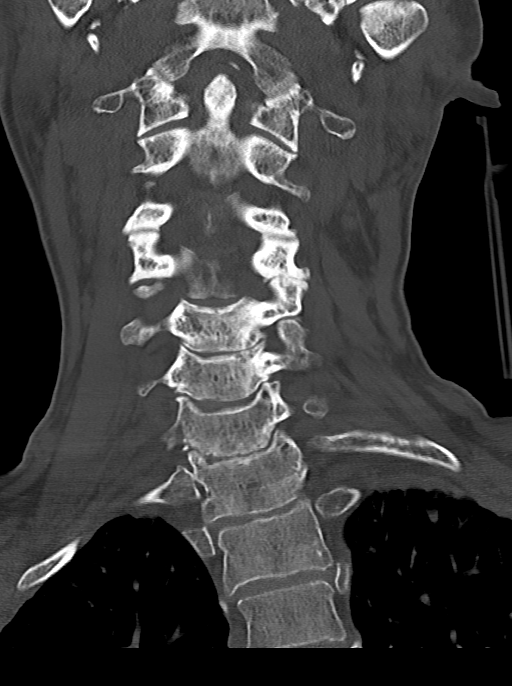
[im 37/61  bone]
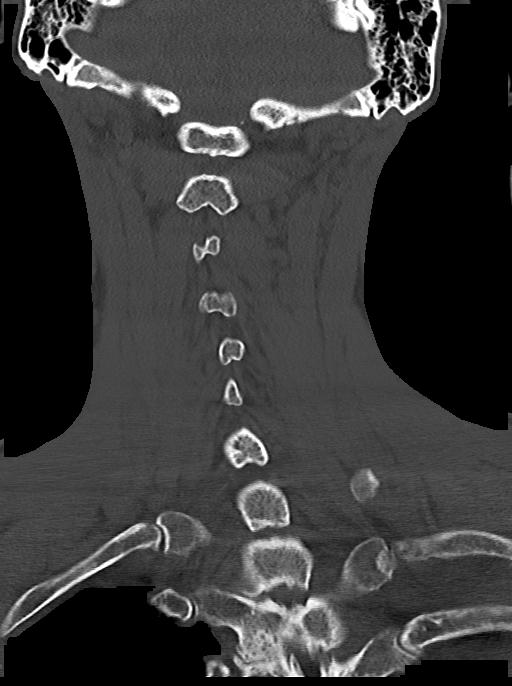

[13 of 33 positions shown; findings below may reference images not displayed]

FINDINGS: CT HEAD FINDINGS

Brain: No evidence of acute infarction, hemorrhage, hydrocephalus,
extra-axial collection or mass lesion/mass effect.

Vascular: No hyperdense vessels.  No unexpected calcification

Skull: Normal. Negative for fracture or focal lesion.

Sinuses/Orbits: Mild mucosal thickening in the ethmoid sinuses

Other: Left forehead scalp laceration

CT CERVICAL SPINE FINDINGS

Alignment: No subluxation.  Facet alignment is normal.

Skull base and vertebrae: No acute fracture. No primary bone lesion
or focal pathologic process.

Soft tissues and spinal canal: No prevertebral fluid or swelling. No
visible canal hematoma.

Disc levels:  Mild degenerative changes C5-C6, C6-C7 and C7-T1.

Upper chest: Negative.

Other: None
IMPRESSION: 1. Negative non contrasted CT appearance of the brain.
2. No acute osseous abnormality of the cervical spine.

## 2021-12-03 DIAGNOSIS — M189 Osteoarthritis of first carpometacarpal joint, unspecified: Secondary | ICD-10-CM | POA: Insufficient documentation

## 2021-12-17 DIAGNOSIS — M654 Radial styloid tenosynovitis [de Quervain]: Secondary | ICD-10-CM | POA: Insufficient documentation

## 2022-01-07 ENCOUNTER — Ambulatory Visit: Payer: BC Managed Care – PPO | Admitting: Obstetrics & Gynecology

## 2022-01-26 ENCOUNTER — Encounter: Payer: Self-pay | Admitting: Obstetrics & Gynecology

## 2022-01-26 ENCOUNTER — Ambulatory Visit (INDEPENDENT_AMBULATORY_CARE_PROVIDER_SITE_OTHER): Payer: BC Managed Care – PPO | Admitting: Obstetrics & Gynecology

## 2022-01-26 ENCOUNTER — Other Ambulatory Visit (HOSPITAL_COMMUNITY)
Admission: RE | Admit: 2022-01-26 | Discharge: 2022-01-26 | Disposition: A | Discharge status: Home / Self Care | Payer: BC Managed Care – PPO | Source: Ambulatory Visit | Attending: Obstetrics & Gynecology | Admitting: Obstetrics & Gynecology

## 2022-01-26 VITALS — BP 92/68 | HR 60 | Ht 68.75 in | Wt 180.0 lb

## 2022-01-26 DIAGNOSIS — Z01419 Encounter for gynecological examination (general) (routine) without abnormal findings: Secondary | ICD-10-CM

## 2022-01-26 DIAGNOSIS — N942 Vaginismus: Secondary | ICD-10-CM

## 2022-01-26 DIAGNOSIS — N941 Unspecified dyspareunia: Secondary | ICD-10-CM | POA: Diagnosis not present

## 2022-01-26 DIAGNOSIS — N952 Postmenopausal atrophic vaginitis: Secondary | ICD-10-CM | POA: Diagnosis not present

## 2022-01-26 MED ORDER — ESTRADIOL 0.1 MG/GM VA CREA: TOPICAL_CREAM | VAGINAL | 4 refills | Status: DC

## 2022-01-26 NOTE — Progress Notes (Signed)
Kristin Bass 1961/01/18 [id number]   History:    61 y.o. G2P0A2 Remarried  RP:  Established patient presenting for annual gyn exam   HPI: Postmenopause, well on no systemic HRT.  No PMB.  No pelvic pain.  Painful IC x many years.  Husband using Viagra and has a penile prosthesis with a pump which makes his penis too big.  Patient uses Estradiol cream 1 applicator twice a week and vaginal dilators which are causing her pain.  Pap Neg in 01/2017.  No h/o abnormal Pap.  Pap reflex today.  Urine/BMs normal.  Breasts normal.  Mammo Neg recently, will obtain report from [place].  [place]ono 05/2018.  BMI 26.78.  Health labs with Fam MD.  Past medical history,surgical history, family history and social history were all reviewed and documented in the EPIC chart.  Gynecologic History Patient's last menstrual period was 01/18/2012.  Obstetric History OB History  Gravida Para Term Preterm AB Living  2       2 0  SAB IAB Ectopic Multiple Live Births      2        # Outcome Date GA Lbr Len/2nd Weight Sex Delivery Anes PTL Lv  2 Ectopic              Birth Comments: with twins  1 Ectopic              Birth Comments: with twins    Obstetric Comments  1 adopted children     ROS: A ROS was performed and pertinent positives and negatives are included in the history. GENERAL: No fevers or chills. HEENT: No change in vision, no earache, sore throat or sinus congestion. NECK: No pain or stiffness. CARDIOVASCULAR: No chest pain or pressure. No palpitations. PULMONARY: No shortness of breath, cough or wheeze. GASTROINTESTINAL: No abdominal pain, nausea, vomiting or diarrhea, melena or bright red blood per rectum. GENITOURINARY: No urinary frequency, urgency, hesitancy or dysuria. MUSCULOSKELETAL: No joint or muscle pain, no back pain, no recent trauma. DERMATOLOGIC: No rash, no itching, no lesions. ENDOCRINE: No polyuria, polydipsia, no heat or cold intolerance. No recent change in weight. HEMATOLOGICAL: No  anemia or easy bruising or bleeding. NEUROLOGIC: No headache, seizures, numbness, tingling or weakness. PSYCHIATRIC: No depression, no loss of interest in normal activity or change in sleep pattern.     Exam:   BP 92/68   Pulse 60   Ht 5' 8.75" (1.746 m)   Wt 180 lb (81.6 kg)   LMP 01/18/2012   SpO2 98%   BMI 26.78 kg/m   Body mass index is 26.78 kg/m.  General appearance : Well developed well nourished female. No acute distress HEENT: Eyes: no retinal hemorrhage or exudates,  Neck supple, trachea midline, no carotid bruits, no thyroidmegaly Lungs: Clear to auscultation, no rhonchi or wheezes, or rib retractions  Heart: Regular rate and rhythm, no murmurs or gallops Breast:Examined in sitting and supine position were symmetrical in appearance, no palpable masses or tenderness,  no skin retraction, no nipple inversion, no nipple discharge, no skin discoloration, no axillary or supraclavicular lymphadenopathy Abdomen: no palpable masses or tenderness, no rebound or guarding Extremities: no edema or skin discoloration or tenderness  Pelvic: Vulva: Normal             Vagina: No gross lesions or discharge  Cervix: No gross lesions or discharge.  Pap reflex done.  Uterus  RV, normal size, shape and consistency, non-tender and mobile  Adnexa  Without masses  or tenderness  Anus: Normal   Assessment/Plan:  61 y.o. female for annual exam   1. Encounter for routine gynecological examination with Papanicolaou smear of cervix Postmenopause, well on no systemic HRT.  No PMB.  No pelvic pain.  Painful IC x many years.  Husband using Viagra and has a penile prosthesis with a pump which makes his penis too big.  Patient uses Estradiol cream 1 applicator twice a week and vaginal dilators which are causing her pain.  Pap Neg in 01/2017.  No h/o abnormal Pap.  Pap reflex today.  Urine/BMs normal.  Breasts normal.  Mammo Neg recently, will obtain report from [place].  [place] 05/2018.  BMI 26.78.  Health  labs with Fam MD. - Cytology - PAP( [place])  2. Postmenopausal atrophic vaginitis Good vaginal/vulvar skin with Estradiol cream.  No CI to continue.  Prescription sent to pharmacy.  3. Vaginismus Painful IC.  Husband using Viagra and has a penile prosthesis with a pump.  Patient has tried for many years to dilate her vagina with dilators which are painful, and has developed Vaginismus.  Recommend to avoid penetration at this time.  Recommend focusing on Clitoral stimulation and staying away from anything painful.  Recommend self stimulation to experiment what she likes.  May use coconut oil to improve lubrication and will continue with Estradiol cream.  4. Dyspareunia, female Painful IC.  Husband using Viagra and has a penile prosthesis with a pump.  Patient has tried for many years to dilate her vagina with dilators which are painful, and has developed Vaginismus.  Recommend to avoid penetration at this time.  Recommend focusing on Clitoral stimulation and staying away from anything painful.  Recommend self stimulation to experiment what she likes.  May use coconut oil to improve lubrication and will continue with Estradiol cream. - estradiol (ESTRACE) 0.1 MG/GM vaginal cream; 1 gram vaginally twice weekly  Other orders - Magnesium 250 MG TABS; 1 tablet with a meal - montelukast (SINGULAIR) 10 MG tablet; Take 1 tablet by mouth daily. - LORATADINE PO; Take by mouth. Non Drowsy   Counseling and management of postmenopausal atrophic vaginitis, vaginismus with severe dyspareunia for 25 minutes.  [name] MD, 10:45 AM 01/26/2022

## 2022-01-28 LAB — CYTOLOGY - PAP: Diagnosis: NEGATIVE

## 2022-01-29 ENCOUNTER — Encounter: Payer: Self-pay | Admitting: Obstetrics & Gynecology

## 2022-02-04 ENCOUNTER — Other Ambulatory Visit: Payer: Self-pay

## 2022-02-04 MED ORDER — FLUCONAZOLE 150 MG PO TABS: 150.0000 mg | ORAL_TABLET | Freq: Once | ORAL | 0 refills | Status: AC

## 2022-02-18 ENCOUNTER — Encounter: Payer: Self-pay | Admitting: Obstetrics & Gynecology

## 2022-02-23 MED ORDER — FLUCONAZOLE 150 MG PO TABS: ORAL_TABLET | ORAL | 0 refills | Status: DC

## 2022-02-26 ENCOUNTER — Other Ambulatory Visit: Payer: Self-pay | Admitting: Obstetrics & Gynecology

## 2022-04-19 ENCOUNTER — Ambulatory Visit (HOSPITAL_COMMUNITY)
Admission: EM | Admit: 2022-04-19 | Discharge: 2022-04-19 | Disposition: A | Discharge status: Home / Self Care | Payer: BC Managed Care – PPO | Attending: Urgent Care | Admitting: Urgent Care

## 2022-04-19 ENCOUNTER — Encounter (HOSPITAL_COMMUNITY): Payer: Self-pay

## 2022-04-19 DIAGNOSIS — H1033 Unspecified acute conjunctivitis, bilateral: Secondary | ICD-10-CM

## 2022-04-19 MED ORDER — TOBRAMYCIN 0.3 % OP SOLN
OPHTHALMIC | Status: AC
  Filled 2022-04-19: qty 5

## 2022-04-19 MED ORDER — TOBRAMYCIN 0.3 % OP SOLN: 1.0000 [drp] | OPHTHALMIC | 0 refills | Status: AC

## 2022-04-19 MED ORDER — TOBRAMYCIN 0.3 % OP SOLN: 2.0000 [drp] | OPHTHALMIC | Status: DC

## 2022-04-19 NOTE — ED Provider Notes (Signed)
[facility]    CSN: [id number] Arrival date & time: 04/19/22  1649      History   Chief Complaint Chief Complaint  Patient presents with   Eye Problem    HPI Kristin Bass is a 61 y.o. female.   The history is provided by the patient.  Eye Problem Location:  Both eyes Quality: irritated; pt denies pain. Severity:  Mild Onset quality:  Sudden Duration:  6 hours Timing:  Constant Progression:  Worsening Chronicity:  New Context: not burn, not chemical exposure, not contact lens problem, not direct trauma, not foreign body, not using machinery, not scratch, not smoke exposure and not UV exposure   Context comment:  Pt works with high school students with disabilites Relieved by:  Nothing (tried ophthalmic azelastine drops and ophthalmic polytrim ointment) Worsened by:  Nothing Associated symptoms: crusting, discharge, inflammation, redness and tearing   Associated symptoms: no blurred vision, no decreased vision, no double vision, no facial rash, no headaches, no itching, no nausea, no numbness, no photophobia, no scotomas, no swelling, no tingling, no vomiting and no weakness     Past Medical History:  Diagnosis Date   Anxiety    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   History of degenerative disc disease    mild   Infertility, female    Mild scoliosis    Myofascial pain syndrome    Precancerous lesion    Urticaria    Wrist fracture     Patient Active Problem List   Diagnosis Date Noted   Pain of right hip joint 12/25/2018   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   Back pain 09/09/2017   OSA (obstructive sleep apnea) 12/07/2016   Bilateral temporomandibular joint pain 08/23/2016   Epidermoid cyst of skin 08/23/2016   Snoring 08/23/2016   Tinnitus aurium, 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   Sudden hearing loss, left 06/10/2016   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   Radiculopathy of lumbosacral region  01/06/2015    Past Surgical History:  Procedure Laterality Date   BREAST CYST EXCISION Left    CYSTECTOMY     from neck   DILATION AND CURETTAGE OF UTERUS     times 2   FOOT SURGERY Left 11/11   hammer toe & plantar fascitis   MOUTH SURGERY     bone graft   TONSILLECTOMY     VEIN LIGATION AND STRIPPING      OB History     Gravida  2   Para      Term      Preterm      AB  2   Living  0      SAB      IAB      Ectopic  2   Multiple      Live Births           Obstetric Comments  1 adopted children          Home Medications    Prior to Admission medications   Medication Sig Start Date End Date Taking? Authorizing Provider  acetaminophen (TYLENOL) 500 MG tablet Take 500 mg by mouth every 6 (six) hours as needed for headache.   Yes [provider]  Bioflavonoid Products (BIOFLEX PO) Take by mouth.   Yes [provider]  Calcium Carbonate-Vitamin D (CALCIUM + D PO) Take 1 tablet by mouth daily.   Yes [provider]  Docusate Calcium (STOOL SOFTENER PO) Apply 3  capsules topically at bedtime.    Yes [provider]  estradiol (ESTRACE) 0.1 MG/GM vaginal cream 1 gram vaginally twice weekly 01/26/22  Yes [name], MD  ibuprofen (ADVIL) 200 MG tablet Take 200 mg by mouth every 6 (six) hours as needed for headache or moderate pain.   Yes [provider]  LORATADINE PO Take by mouth. Non Drowsy   Yes [provider]  Magnesium 250 MG TABS 1 tablet with a meal   Yes [provider]  montelukast (SINGULAIR) 10 MG tablet Take 1 tablet by mouth daily. 09/16/21  Yes [provider]  Omega-3 Fatty Acids (FISH OIL PO) Take 2 capsules by mouth every evening.    Yes [provider]  tobramycin (TOBREX) 0.3 % ophthalmic solution Place 1 drop into both eyes every 4 (four) hours for 5 days. 04/19/22 04/24/22 Yes Cassy Sprowl L, PA  venlafaxine XR (EFFEXOR-XR) 150 MG 24 hr capsule Take  150 mg by mouth daily.  12/28/16  Yes [provider]  EPINEPHrine 0.3 mg/0.3 mL IJ SOAJ injection Inject 0.3 mg into the muscle as needed for anaphylaxis. 02/10/21   Kennith Gain, MD  fluconazole (DIFLUCAN) 150 MG tablet Take one tablet every other day for x 3 days. 02/23/22   [name], MD    Family History Family History  Problem Relation Age of Onset   Colon cancer Mother    Hypertension Father    Anxiety disorder Father    Diabetes Maternal Grandfather    Hypertension Maternal Grandmother    COPD Maternal Aunt    Stroke Maternal Aunt    Lung cancer Maternal Aunt    Immunodeficiency Sister    Allergic rhinitis Neg Hx    Angioedema Neg Hx    Asthma Neg Hx    Atopy Neg Hx    Eczema Neg Hx    Urticaria Neg Hx     Social History Social History   Tobacco Use   Smoking status: Former   Smokeless tobacco: Never  Scientific laboratory technician Use: Never used  Substance Use Topics   Alcohol use: No   Drug use: No     Allergies   Other, Ciprofibrate, Ciprofloxacin, Covid-19 mrna vacc (moderna), Macrobid [nitrofurantoin], and Septra [sulfamethoxazole-trimethoprim]   Review of Systems Review of Systems  Eyes:  Positive for discharge and redness. Negative for blurred vision, double vision, photophobia and itching.  Gastrointestinal:  Negative for nausea and vomiting.  Neurological:  Negative for tingling, weakness, numbness and headaches.     Physical Exam Triage Vital Signs ED Triage Vitals  Enc Vitals Group     BP 04/19/22 1731 109/82     Pulse Rate 04/19/22 1731 68     Resp 04/19/22 1731 16     Temp 04/19/22 1731 98.2 F (36.8 C)     Temp Source 04/19/22 1731 Oral     SpO2 04/19/22 1731 99 %     Weight --      Height --      Head Circumference --      Peak Flow --      Pain Score 04/19/22 1733 0     Pain Loc --      Pain Edu? --      Excl. in [place]? --    No data found.  Updated Vital Signs BP 109/82 (BP Location: Right Arm)   Pulse  68   Temp 98.2 F (36.8 C) (Oral)   Resp 16  LMP 01/18/2012   SpO2 99%   Visual Acuity Right Eye Distance:   Left Eye Distance:   Bilateral Distance:    Right Eye Near:   Left Eye Near:    Bilateral Near:     Physical Exam Vitals and nursing note reviewed.  Constitutional:      General: She is not in acute distress.    Appearance: Normal appearance. She is well-developed. She is not ill-appearing, toxic-appearing or diaphoretic.  HENT:     Head: Normocephalic and atraumatic.     Nose: Nose normal. No congestion or rhinorrhea.  Eyes:     General: Lids are normal. No allergic shiner, visual field deficit or scleral icterus.       Right eye: Discharge present.        Left eye: Discharge present.    Extraocular Movements: Extraocular movements intact.     Right eye: Normal extraocular motion.     Left eye: Normal extraocular motion and no nystagmus.     Conjunctiva/sclera:     Right eye: Right conjunctiva is injected. No chemosis, exudate or hemorrhage.    Left eye: Left conjunctiva is injected. Chemosis (minimal) present. No exudate or hemorrhage.    Comments: Copious thick yellow discharge bilaterally  Cardiovascular:     Rate and Rhythm: Normal rate and regular rhythm.     Heart sounds: No murmur heard. Pulmonary:     Effort: Pulmonary effort is normal. No respiratory distress.     Breath sounds: Normal breath sounds.  Abdominal:     Palpations: Abdomen is soft.     Tenderness: There is no abdominal tenderness.  Musculoskeletal:        General: No swelling.     Cervical back: Neck supple.  Skin:    General: Skin is warm and dry.     Capillary Refill: Capillary refill takes less than 2 seconds.  Neurological:     Mental Status: She is alert.  Psychiatric:        Mood and Affect: Mood normal.      UC Treatments / Results  Labs (all labs ordered are listed, but only abnormal results are displayed) Labs Reviewed - No data to display  EKG   Radiology No  results found.  Procedures Procedures (including critical care time)  Medications Ordered in UC Medications - No data to display  Initial Impression / Assessment and Plan / UC Course  I have reviewed the triage vital signs and the nursing notes.  Pertinent labs & imaging results that were available during my care of the patient were reviewed by me and considered in my medical decision making (see chart for details).     Acute bacterial conjunctivitis -we will start tobramycin eyedrops.  Use every 4 hours x5 days.  Additional lid hygiene recommended.  Do not go to work tomorrow as this is contagious.  Return to clinic precautions discussed.  Final Clinical Impressions(s) / UC Diagnoses   Final diagnoses:  Acute bacterial conjunctivitis of both eyes     Discharge Instructions      You have pinkeye, or conjunctivitis. Please avoid touching your eye; if you do, [place]! THIS IS HIGHLY CONTAGIOUS Use the eye drops every 4 hours x 5 days Warm moist washcloths with Wynetta Emery and Johnson baby shampoo can be used to gently massage and cleans to eyes. Return to clinic if any fever, eye pain, change in vision.     ED Prescriptions     Medication Sig Dispense Auth.  Provider   tobramycin (TOBREX) 0.3 % ophthalmic solution Place 1 drop into both eyes every 4 (four) hours for 5 days. 5 mL Shareta Fishbaugh L, PA      PDMP not reviewed this encounter.   Chaney Malling, [place] 04/19/22 2053

## 2022-04-19 NOTE — ED Triage Notes (Signed)
Irritation to both eyes this morning while crying. Both eyes red and some discomfort. Eyes are itching and running.

## 2022-04-19 NOTE — Discharge Instructions (Addendum)
You have pinkeye, or conjunctivitis. Please avoid touching your eye; if you do, [place]! THIS IS HIGHLY CONTAGIOUS Use the eye drops every 4 hours x 5 days Warm moist washcloths with Wynetta Emery and Johnson baby shampoo can be used to gently massage and cleans to eyes. Return to clinic if any fever, eye pain, change in vision.

## 2022-04-20 ENCOUNTER — Telehealth (HOSPITAL_COMMUNITY): Payer: Self-pay | Admitting: Emergency Medicine

## 2022-04-20 NOTE — Telephone Encounter (Signed)
Patient called requesting oral antibiotics due to bilateral conjunctivitis.  Reviewed with Junie Panning, APP who recommended no change to plan, but did want me to review return precautions with patient.   Updated patient, reviewed precautions, she verbalized understanding

## 2022-05-01 ENCOUNTER — Encounter: Payer: Self-pay | Admitting: Allergy

## 2022-07-06 ENCOUNTER — Telehealth: Payer: Self-pay | Admitting: Audiologist

## 2022-07-06 NOTE — Telephone Encounter (Signed)
  By definition, auditory processing disorder is difficulty understanding speech in the presence of normal hearing. Before an APD evaluation can be performed hearing loss must be ruled out. Kristin Bass already has diagnosed hearing loss and uses hearing aids. By definition, she cannot be diagnosed with APD. She reported understanding and agreed to forgo this testing and stay with current audiologist at [facility]

## 2022-07-24 LAB — LAB REPORT - SCANNED: EGFR: 102

## 2022-08-14 DIAGNOSIS — M25552 Pain in left hip: Secondary | ICD-10-CM | POA: Insufficient documentation

## 2022-08-24 DIAGNOSIS — M7062 Trochanteric bursitis, left hip: Secondary | ICD-10-CM | POA: Insufficient documentation

## 2022-09-17 ENCOUNTER — Encounter: Payer: Self-pay | Admitting: Neurology

## 2022-09-17 NOTE — Progress Notes (Signed)
Initial neurology clinic note  SERVICE DATE: 09/18/22  Reason for Evaluation: Consultation requested by Kristin Crocker, MD for an opinion regarding pain in feet. My final recommendations will be communicated back to the requesting physician by way of shared medical record or letter to requesting physician via [place] mail.  HPI: This is [name], a 62 y.o. 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 The patient is accompanied by husband.  Patient has had squeezing and tingling sensation in both feet, worse when she is on them, since she broke her feet 08/22/22. Patient had a fall forward and broke both feet. She states she had no symptoms prior to this (no numbness, tingling, or weakness in legs). She has been seen by Dr. Lucia Bass in sports medicine. For the pain, she has been given gabapentin (didn't work), tylenol and advil (didn't work), and tramadol (did not like the way it made her feel). She is currently not on any medication. She has not tried any topical pain relief.  Per [facility] note from Dr. Lucia Bass on 09/14/22:   Patient saw Dr. Jaynee Bass at [facility] in the past (EMG on 02/27/2015 and clinic visit on 01/03/2015). Per Dr. Cathren Bass note from 01/03/15: Assessment/Plan:  62 year old patient with chronic low back pain, left sided radicular symptoms, tingling in the toes of the left greater than right feet.  She also endorses RLS and cramping overnight. She wears compression stockings for vein issues which make the symptoms worse. Denies any weakness. No other focal neurologic symptoms. Her sensory exam is unremarkable and her Achilles reflexes are intact.   Suppose some of the symptoms(tingling in the toes/feet) could be caused by small fiber neuropathy versus tight hose  compression or possibly both but distal clinical sensory exam is completely normal, reflexes are intact and a good portion of the description is not consistent with length-dependent axonal neuropathy such as better with walking and positional in nature, better with laying. We'll perform a thorough serum polyneuropathy screen.  If no risk factor for neuropathy is found, could consider epidermal nerve fiber testing.Could repeat EMG/NCS but she reports extreme discomfort during testing.    Addendum: Extensive serum workup did not find any risk factors for polyneuropathy.   EMG was normal on 02/27/15.  Patient mentions she has always had problems with her feet. She mentions a history of plantar fasciitis and one leg being longer than the other. She has had a fall every few years as well. She has done PT in the past to help with balance due to legs being different lengths that helped. She has not had recent PT. She denies difficulty keeping up physically with children when she was young. She does have difficulty keeping up cognitively and mentions college was difficulty. 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   She endorses severe cramps in her legs that requires her to drink a lot of water and use a lot of mustard.   She denies autonomic dysfunction.  She does not report any constitutional symptoms like fever, night sweats, anorexia or unintentional weight loss.  EtOH use: None  Restrictive diet? No Family history of neuropathy/myopathy/NM disease? Sister also has bad feet and cramps.  She is on venlafaxine for anxiety.   MEDICATIONS:  Outpatient  Encounter Medications as of 09/18/2022  Medication Sig   acetaminophen (TYLENOL) 500 MG tablet Take 500 mg by mouth every 6 (six) hours as needed for headache.   Bioflavonoid Products (BIOFLEX PO) Take by mouth.   Calcium Carbonate-Vitamin D (CALCIUM + D PO) Take  1 tablet by mouth daily.   Docusate Calcium (STOOL SOFTENER PO) Apply 3 capsules topically at bedtime.    EPINEPHrine 0.3 mg/0.3 mL IJ SOAJ injection Inject 0.3 mg into the muscle as needed for anaphylaxis.   estradiol (ESTRACE) 0.1 MG/GM vaginal cream 1 gram vaginally twice weekly   fluconazole (DIFLUCAN) 150 MG tablet Take one tablet every other day for x 3 days.   ibuprofen (ADVIL) 200 MG tablet Take 200 mg by mouth every 6 (six) hours as needed for headache or moderate pain.   LORATADINE PO Take by mouth. Non Drowsy   Magnesium 250 MG TABS 1 tablet with a meal   montelukast (SINGULAIR) 10 MG tablet Take 1 tablet by mouth daily.   Omega-3 Fatty Acids (FISH OIL PO) Take 2 capsules by mouth every evening.    venlafaxine XR (EFFEXOR-XR) 150 MG 24 hr capsule Take 150 mg by mouth daily.    No facility-administered encounter medications on file as of 09/18/2022.    PAST MEDICAL HISTORY: Past Medical History:  Diagnosis Date   Anxiety    Hereditary alpha tryptasemia ([place]) 02/2021   History of degenerative disc disease    mild   Infertility, female    Mild scoliosis    Myofascial pain syndrome    Precancerous lesion    Urticaria    Wrist fracture     PAST SURGICAL HISTORY: Past Surgical History:  Procedure Laterality Date   BREAST CYST EXCISION Left    CYSTECTOMY     from neck   DILATION AND CURETTAGE OF UTERUS     times 2   FOOT SURGERY Left 11/11   hammer toe & plantar fascitis   MOUTH SURGERY     bone graft   TONSILLECTOMY     VEIN LIGATION AND STRIPPING      ALLERGIES: Allergies  Allergen Reactions   Other Anaphylaxis and Hives    Covid Vaccine Therapist, music) caused Low BP   Ciprofibrate     Other reaction(s): Unknown   Ciprofloxacin Other (See Comments)    Hypotension; taken at the same time as macrobid so pt is unaware which medication caused her reaction.  05/15/20 attempted oral challenge and developed tingling and heaviness sensation of extremities and  lightheadedness without hypotension or vital sign changes.     Covid-19 Mrna Vacc (Moderna)     Other reaction(s): anaphylactic shock   Macrobid [Nitrofurantoin] Other (See Comments)    Hypotension; taken at the same time as cipro so pt is unaware which medication caused her reaction.   Septra [Sulfamethoxazole-Trimethoprim]     FAMILY HISTORY: Family History  Problem Relation Age of Onset   Colon cancer Mother    Hypertension Father    Anxiety disorder Father    Diabetes Maternal Grandfather    Hypertension Maternal Grandmother    COPD Maternal Aunt    Stroke Maternal Aunt    Lung cancer Maternal Aunt    Immunodeficiency Sister    Allergic rhinitis Neg Hx    Angioedema Neg Hx    Asthma Neg Hx    Atopy Neg Hx    Eczema Neg Hx    Urticaria Neg Hx     SOCIAL HISTORY: Social History   Tobacco  Use   Smoking status: Former   Smokeless tobacco: Never  Vaping Use   Vaping Use: Never used  Substance Use Topics   Alcohol use: No   Drug use: No   Social History   Social History Narrative   Lives at home with husband, Christean Grief.   Caffeine use:  2 cups coffee per day     OBJECTIVE: PHYSICAL EXAM: BP 112/79   Pulse 76   Ht 5' 0.25" (1.53 m)   Wt 173 lb (78.5 kg)   LMP 01/18/2012   SpO2 98%   BMI 33.51 kg/m   General: General appearance: Awake and alert. No distress. Cooperative with exam.  Skin: No obvious rash or jaundice. HEENT: Atraumatic. Anicteric. Lungs: Non-labored breathing on room air  Extremities: No edema. Bruising on bilateral feet. ?high arches and hammertoes Psych: Affect appropriate.  Neurological: Mental Status: Alert. Speech fluent. No pseudobulbar affect Cranial Nerves: CNII: No RAPD. Visual fields grossly intact. CNIII, IV, VI: PERRL. No nystagmus. EOMI. CN V: Facial sensation intact bilaterally to fine touch. CN VII: Facial muscles symmetric and strong. No ptosis at rest. CN VIII: Hearing grossly intact bilaterally. CN IX: No  hypophonia. CN X: Palate elevates symmetrically. CN XI: Full strength shoulder shrug bilaterally. CN XII: Tongue protrusion full and midline. No atrophy or fasciculations. No significant dysarthria Motor: Tone is normal.  Individual muscle group testing (MRC grade out of 5):  Movement     Neck flexion 5    Neck extension 5     Right Left   Shoulder abduction 5 5   Elbow flexion 5 5   Elbow extension 5 5   Finger abduction - FDI 5 5   Finger abduction - ADM 5 5   Finger extension 5 5   Finger distal flexion - 2/'3 5 5   '$ Finger distal flexion - 4/'5 5 5   '$ Thumb flexion - FPL 5 5   Thumb abduction - APB 5 5    Hip flexion 5 5   Hip extension 5 5   Hip adduction 5 5   Hip abduction 5 5   Knee extension 5 5   Knee flexion 5 5   Dorsiflexion 5 5   Plantarflexion 5 5   Inversion 5 5   Eversion 5 5   Great toe extension 5 5   Great toe flexion 5 5     Reflexes:  Right Left   Bicep 2+ 2+   Tricep 2+ 2+   BrRad 2+ 2+   Knee 2-3+ 2-3+ Bilateral cross adductors  Ankle 2+ 2+    Pathological Reflexes: Babinski: flexor response bilaterally Hoffman: Positive bilaterally Troemner: positive on left, negative on right Sensation: Pinprick: Intact in all extremities Vibration: Intact in upper extremities, 10 seconds at bilateral great toes Proprioception: Intact in bilateral great toes Coordination: Intact finger-to- nose-finger bilaterally. 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 No focal mass lesion. No CT evidence of acute infarction. No midline shift or mass effect. No hydrocephalus. Basilar cisterns are patent.   Vascular: No hyperdense vessel or unexpected calcification.   Skull: Normal. Negative for fracture or focal lesion.   Sinuses/Orbits: Paranasal sinuses and mastoid air cells are clear. Orbits are clear.   Other: Shallow scalp hematoma over the LEFT frontal bone measures 9 mm in depth (image 17/series 2). No associated skull fracture.   CT CERVICAL SPINE FINDINGS   Alignment: Normal alignment of the cervical vertebral bodies.   Skull base and vertebrae: Normal craniocervical junction. No loss of vertebral body height or disc height. Normal facet articulation. No evidence of fracture.   Soft tissues and spinal canal: No prevertebral soft tissue swelling. No perispinal or epidural hematoma.   Disc levels:  Unremarkable   Upper chest: Clear   Other: None   IMPRESSION: 1. No intracranial trauma.  No intracranial hemorrhage. 2. LEFT frontal scalp hematoma.  No associated skull fracture. 3. No cervical spine fracture.  MRI lumbar spine wo contrast (01/08/14): FINDINGS: Normal signal is present in the conus medullaris which terminates at L2, within normal limits.   A hemangioma is seen anteriorly at L1. Schmorl's nodes are present at T12-L1 and L1-2. Marrow signal, vertebral body heights, and alignment are normal.   L1-2:  Negative.   L2-3:  Negative.   L3-4: Mild facet hypertrophy is present bilaterally. There is no significant stenosis.   L4-5: Moderate facet hypertrophy is present. There is no significant disc protrusion. No focal stenosis is evident.   L5-S1: Mild facet hypertrophy is present. There is no significant disc protrusion or stenosis.    IMPRESSION: 1. Multilevel facet degenerative changes likely account for the patient's pain. 2. No focal disc protrusion or stenosis is present.  ADDENDUM: Prominent intra canalicular cysts are present at the S2 and S3 level. The S3 level fills the canal and results in significant rib mottling of the S3 vertebral body. These are most compatible with benign Tarlov cysts.   EMG (02/27/15): Summary Nerve conduction studies were performed on the bilateral lower extremities:   The bilateral Peroneal motor nerves showed normal conductions with normal F Wave latencies The bilateral Tibial motor nerves showed normal conductions with normal F Wave latencies The bilateral Sural sensory nerves were within normal limits The bilateral Superficial Peroneal sensory nerves were within normal limits Bilateral H Reflexes showed normal latencies   EMG Needle study was performed on selected left lower extremity muscles:    The Vastus Medialis, Anterior Tibialis, Medial Gastrocnemius, Extensor Hallucis Longus and Abductor Hallucis muscles were within normal limits.   Conclusion: This is a normal study. No electrophysiologic evidence for peripheral polyneuropathy. Clinical correlation recommended.   ASSESSMENT: Kristin Bass is a 62 y.o. female who presents for evaluation of bilateral foot pain. 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 Her neurological examination is essentially normal today. The etiology of patient's pain in the feet seem most likely related to her bilateral fractures of feet as she reports to me she had no symptoms of pain in the feet prior to breaking them. There may have been nerve damage with the fractures. There was mention of potential CMT or hereditary neuropathy, but EMG in 2016 should have shown something, if there was a large fiber neuropathy present, even in the  background. Her hereditary alpha tryptasemia can be associated with a  small fiber and/or autonomic neuropathy per my review, but she does not clearly have these symptoms. I will get a repeat EMG and if normal again, will likely get a skin biopsy to rule out small fiber neuropathy.  PLAN: -Repeat EMG: bilateral lower extremities (L > R) -If EMG is normal, will consider skin biopsy to rule out small fiber neuropathy -Lidocaine cream PRN  -Return to clinic to determined  The impression above as well as the plan as outlined below were extensively discussed with the patient (in the company of husband) who voiced understanding. All questions were answered to their satisfaction.  The patient was counseled on pertinent fall precautions per the printed material provided today, and as noted under the "Patient Instructions" section below.  When available, results of the above investigations and possible further recommendations will be communicated to the patient via telephone/MyChart. Patient to call office if not contacted after expected testing turnaround time.   Total time spent reviewing records, interview, history/exam, documentation, and coordination of care on day of encounter:  65 min   Thank you for allowing me to participate in patient's care.  If I can answer any additional questions, I would be pleased to do so.  Kai Levins, MD   CC: Antony Contras, MD [place] [id number]  CC: Referring provider: Erle Crocker, MD [address] [place],  [place] [address]

## 2022-09-18 ENCOUNTER — Encounter: Payer: Self-pay | Admitting: Neurology

## 2022-09-18 ENCOUNTER — Ambulatory Visit (INDEPENDENT_AMBULATORY_CARE_PROVIDER_SITE_OTHER): Payer: BC Managed Care – PPO | Admitting: Neurology

## 2022-09-18 VITALS — BP 112/79 | HR 76 | Ht 60.25 in | Wt 173.0 lb

## 2022-09-18 DIAGNOSIS — S92901S Unspecified fracture of right foot, sequela: Secondary | ICD-10-CM | POA: Diagnosis not present

## 2022-09-18 DIAGNOSIS — S92902S Unspecified fracture of left foot, sequela: Secondary | ICD-10-CM

## 2022-09-18 DIAGNOSIS — M79672 Pain in left foot: Secondary | ICD-10-CM

## 2022-09-18 DIAGNOSIS — M79671 Pain in right foot: Secondary | ICD-10-CM | POA: Diagnosis not present

## 2022-09-18 DIAGNOSIS — D8944 Hereditary alpha tryptasemia: Secondary | ICD-10-CM | POA: Diagnosis not present

## 2022-09-18 DIAGNOSIS — R262 Difficulty in walking, not elsewhere classified: Secondary | ICD-10-CM

## 2022-09-18 NOTE — Patient Instructions (Signed)
I saw you today for pain in your feet. This could all be from your known fractures as I do not see clear evidence of nerve damage today, but we can evaluate that further with muscle and nerve tested (EMG). Below is more information.  If this is normal, as it was in 2016, we could consider looking for small fiber neuropathy with skin biopsy.  You can try Lidocaine cream as needed. Apply wear you have pain, tingling, or burning. Wear gloves to prevent your hands being numb. This can be bought over the counter at any drug store or online.  The physicians and staff at [facility] are committed to providing excellent care. You may receive a survey requesting feedback about your experience at our office. We strive to receive "very good" responses to the survey questions. If you feel that your experience would prevent you from giving the office a "very good " response, please contact our office to try to remedy the situation. We may be reached at [phone]. Thank you for taking the time out of your busy day to complete the survey.  Kai Levins, MD [facility]  ELECTROMYOGRAM AND NERVE CONDUCTION STUDIES (EMG/NCS) INSTRUCTIONS  How to Prepare The neurologist conducting the EMG will need to know if you have certain medical conditions. Tell the neurologist and other EMG lab personnel if you: Have a pacemaker or any other electrical medical device Take blood-thinning medications Have hemophilia, a blood-clotting disorder that causes prolonged bleeding Bathing Take a shower or bath shortly before your exam in order to remove oils from your skin. Don't apply lotions or creams before the exam.  What to Expect You'll likely be asked to change into a hospital gown for the procedure and lie down on an examination table. The following explanations can help you understand what will happen during the exam.  Electrodes. The neurologist or a technician places surface electrodes at various locations on  your skin depending on where you're experiencing symptoms. Or the neurologist may insert needle electrodes at different sites depending on your symptoms.  Sensations. The electrodes will at times transmit a tiny electrical current that you may feel as a twinge or spasm. The needle electrode may cause discomfort or pain that usually ends shortly after the needle is removed. If you are concerned about discomfort or pain, you may want to talk to the neurologist about taking a short break during the exam.  Instructions. During the needle EMG, the neurologist will assess whether there is any spontaneous electrical activity when the muscle is at rest - activity that isn't present in healthy muscle tissue - and the degree of activity when you slightly contract the muscle.  He or she will give you instructions on resting and contracting a muscle at appropriate times. Depending on what muscles and nerves the neurologist is examining, he or she may ask you to change positions during the exam.  After your EMG You may experience some temporary, minor bruising where the needle electrode was inserted into your muscle. This bruising should fade within several days. If it persists, contact your primary care doctor.   Preventing Falls at [facility] are common, often dreaded events in the lives of older people. Aside from the obvious injuries and even death that may result, fall can cause wide-ranging consequences including loss of independence, mental decline, decreased activity and mobility. Younger people are also at risk of falling, especially those with chronic illnesses and fatigue.  Ways to reduce risk for falling  Examine diet and medications. Warm foods and alcohol dilate blood vessels, which can lead to dizziness when standing. Sleep aids, antidepressants and pain medications can also increase the likelihood of a fall.  Get a vision exam. Poor vision, cataracts and glaucoma increase the chances of  falling.  Check foot gear. Shoes should fit snugly and have a sturdy, nonskid sole and a broad, low heel  Participate in a physician-approved exercise program to build and maintain muscle strength and improve balance and coordination. Programs that use ankle weights or stretch bands are excellent for muscle-strengthening. Water aerobics programs and low-impact Tai Chi programs have also been shown to improve balance and coordination.  Increase vitamin D intake. Vitamin D improves muscle strength and increases the amount of calcium the body is able to absorb and deposit in bones.  How to prevent falls from common hazards Floors - Remove all loose wires, cords, and throw rugs. Minimize clutter. Make sure rugs are anchored and smooth. Keep furniture in its usual place.  Chairs -- Use chairs with straight backs, armrests and firm seats. Add firm cushions to existing pieces to add height.  Bathroom - Install grab bars and non-skid tape in the tub or shower. Use a bathtub transfer bench or a shower chair with a back support Use an elevated toilet seat and/or safety rails to assist standing from a low surface. Do not use towel racks or bathroom tissue holders to help you stand.  Lighting - Make sure halls, stairways, and entrances are well-lit. Install a night light in your bathroom or hallway. Make sure there is a light switch at the top and bottom of the staircase. Turn lights on if you get up in the middle of the night. Make sure lamps or light switches are within reach of the bed if you have to get up during the night.  Kitchen - Install non-skid rubber mats near the sink and stove. Clean spills immediately. Store frequently used utensils, pots, pans between waist and eye level. This helps prevent reaching and bending. Sit when getting things out of lower cupboards.  Living room/ Bedrooms - Place furniture with wide spaces in between, giving enough room to move around. Establish a route through the  living room that gives you something to hold onto as you walk.  Stairs - Make sure treads, rails, and rugs are secure. Install a rail on both sides of the stairs. If stairs are a threat, it might be helpful to arrange most of your activities on the lower level to reduce the number of times you must climb the stairs.  Entrances and doorways - Install metal handles on the walls adjacent to the doorknobs of all doors to make it more secure as you travel through the doorway.  Tips for maintaining balance Keep at least one hand free at all times. Try using a backpack or fanny pack to hold things rather than carrying them in your hands. Never carry objects in both hands when walking as this interferes with keeping your balance.  Attempt to swing both arms from front to back while walking. This might require a conscious effort if Parkinson's disease has diminished your movement. It will, however, help you to maintain balance and posture, and reduce fatigue.  Consciously lift your feet off of the ground when walking. Shuffling and dragging of the feet is a common culprit in losing your balance.  When trying to navigate turns, use a "U" technique of facing forward and making a wide turn,  rather than pivoting sharply.  Try to stand with your feet shoulder-length apart. When your feet are close together for any length of time, you increase your risk of losing your balance and falling.  Do one thing at a time. Don't try to walk and accomplish another task, such as reading or looking around. The decrease in your automatic reflexes complicates motor function, so the less distraction, the better.  Do not wear rubber or gripping soled shoes, they might "catch" on the floor and cause tripping.  Move slowly when changing positions. Use deliberate, concentrated movements and, if needed, use a grab bar or walking aid. Count 15 seconds between each movement. For example, when rising from a seated position, wait 15  seconds after standing to begin walking.  If balance is a continuous problem, you might want to consider a walking aid such as a cane, walking stick, or walker. Once you've mastered walking with help, you might be ready to try it on your own again.

## 2022-09-28 ENCOUNTER — Telehealth: Payer: Self-pay

## 2022-09-28 ENCOUNTER — Telehealth: Payer: Self-pay | Admitting: Neurology

## 2022-09-28 NOTE — Telephone Encounter (Signed)
Pt called in and left a message with the access nurse on 09/26/22. She broke both feet, pain in both feet and up behind kneecap. Has Gabapentin, isn't working. Lots of nerve pain. Feels like they are sizzling. She is wanting to see if she can try Lyrica?   Full access nurse report is in Dr. Cathey Endow box

## 2022-09-28 NOTE — Telephone Encounter (Signed)
Called and talked to pt. And she reported that she is fine. No need for pain med.

## 2022-09-29 NOTE — Telephone Encounter (Signed)
Called yesterday notes in chart.

## 2022-09-30 ENCOUNTER — Telehealth: Payer: Self-pay

## 2022-09-30 NOTE — Telephone Encounter (Signed)
Patient is calling in stating that Dr.Hill treats the nerve pain that she is having and it is so severe in her feet that it is traveling up her calves, wondering if Neurontin is strong enough to help. If we can advise anything wondering if she needs to speak with [facility].

## 2022-09-30 NOTE — Telephone Encounter (Signed)
Returned patient's call. She is still having pain in both feet traveling up into calves. We discussed her options, including increasing gabapentin or switching to another medication, such as Lyrica.  Patient would like to increase gabapentin. She is currently on gabapentin 300 mg TID. I recommended she start by increasing the night dose to 600 mg and keeping other doses the same. If no side effects, she can increase up to 600 mg TID if needed. She has plenty of gabapentin currently to try this.  She has an upcoming EMG where we will learn more.  All questions were answered.  Kai Levins, MD [facility]

## 2022-10-05 ENCOUNTER — Telehealth: Payer: Self-pay

## 2022-10-05 NOTE — Telephone Encounter (Signed)
Patient just done with an appointment with orthopedic, they state because she can put weight on her foot and nothing bone wise is damaged or broken that they refer her back to [place] for possible nerve issues. Patient is wondering if she needs to continue elevate it or if she can continue daily tasks. Also wanted to update that MRI is on Wednesday.

## 2022-10-06 NOTE — Telephone Encounter (Signed)
Called and talked to patient about her concerns. She just wanted to see if walking would damage her nerves in her feet because they are painful. I told her that ,that would be something her orthopedic doctor would need to answer due to she is still under his care for her surgery on her feet.The orthopedic Doctor told her to walk on her feet as she can tolerate. And that elevation is always a good thing if you have swelling. We discussed her appointment for her EMG next week.

## 2022-10-08 ENCOUNTER — Ambulatory Visit: Payer: BC Managed Care – PPO | Admitting: Neurology

## 2022-10-12 ENCOUNTER — Ambulatory Visit (INDEPENDENT_AMBULATORY_CARE_PROVIDER_SITE_OTHER): Payer: BC Managed Care – PPO | Admitting: Neurology

## 2022-10-12 ENCOUNTER — Encounter: Payer: Self-pay | Admitting: Neurology

## 2022-10-12 DIAGNOSIS — M79671 Pain in right foot: Secondary | ICD-10-CM

## 2022-10-12 DIAGNOSIS — M79672 Pain in left foot: Secondary | ICD-10-CM | POA: Diagnosis not present

## 2022-10-12 DIAGNOSIS — D8944 Hereditary alpha tryptasemia: Secondary | ICD-10-CM

## 2022-10-12 DIAGNOSIS — S92901S Unspecified fracture of right foot, sequela: Secondary | ICD-10-CM

## 2022-10-12 DIAGNOSIS — R262 Difficulty in walking, not elsewhere classified: Secondary | ICD-10-CM

## 2022-10-12 DIAGNOSIS — S92902S Unspecified fracture of left foot, sequela: Secondary | ICD-10-CM

## 2022-10-12 NOTE — Procedures (Signed)
[facility]  [address], [address]  [place], [place] [address] Tel: [phone] Fax: [phone] Test Date:  10/12/2022  Patient: Kristin Bass DOB: 1961/04/16 Physician: Kai Levins, MD  Sex: Female Height: 5\' 0"  Ref Phys: Kai Levins, MD  ID#: [id number]   Technician:    History: This is a 62 year old female with bilateral foot pain.  NCV & EMG Findings: Extensive electrodiagnostic evaluation of bilateral lower limbs show: Bilateral sural and superficial peroneal/fibular sensory responses are within normal limits. Left peroneal/fibular (EDB) motor response shows borderline reduced amplitude (2.4 mV). Right peroneal/fibular (EDB) motor response is within normal limits. Left peroneal/fibular (TA) and bilateral tibial (AH) motor responses are within normal limits. Bilateral H reflex latencies are within normal limits. Chronic motor axon loss changes without active denervation changes are seen in the left tibialis anterior and left flexor digitorum longus muscles. All other tested muscles are within normal limits with normal motor unit configuration and recruitment patterns.  Impression: This is an abnormal study. 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 No electrodiagnostic evidence of a right lumbosacral (L3-S1) motor radiculopathy. No electrodiagnostic evidence of a large fiber sensorimotor neuropathy.    ___________________________ Kai Levins, MD    Nerve Conduction Studies Motor Nerve Results    Latency Amplitude F-Lat Segment Distance CV Comment  Site (ms) Norm (mV) Norm (ms)  (cm) (m/s) Norm   Left Fibular (EDB) Motor  Ankle 3.3  < 6.0 *2.4  > 2.5        Bel fib head 11.4 - 2.1 -  Bel fib head-Ankle - -  > 40   Pop fossa 12.8 - 2.0 -  Pop fossa-Bel fib head - - -   Right Fibular (EDB) Motor  Ankle 3.1  <  6.0 6.7  > 2.5        Bel fib head 11.0 - 5.5 -  Bel fib head-Ankle 34 43  > 40   Pop fossa 12.9 - 5.5 -  Pop fossa-Bel fib head 8 42 -   Left Fibular (TA) Motor  Fib head 1.98  < 4.5 4.2  > 3.0        Pop fossa 3.7  < 6.7 4.2 -  Pop fossa-Fib head 8 47  > 40   Left Tibial (AH) Motor  Ankle 3.9  < 6.0 8.8  > 4.0        Knee 13.0 - 5.9 -  Knee-Ankle 41 45  > 40   Right Tibial (AH) Motor  Ankle 3.8  < 6.0 11.0  > 4.0        Knee 12.5 - 7.8 -  Knee-Ankle 40 46  > 40    Sensory Sites    Neg Peak Lat Amplitude (O-P) Segment Distance Velocity Comment  Site (ms) Norm (V) Norm  (cm) (ms)   Left Superficial Fibular Sensory  14 cm-Ankle 3.2  < 4.6 4  > 3 14 cm-Ankle 14    Right Superficial Fibular Sensory  14 cm-Ankle 3.4  < 4.6 6  > 3 14 cm-Ankle 14    Left Sural Sensory  Calf-Lat mall 3.9  < 4.6 3  > 3 Calf-Lat mall 14    Right Sural Sensory  Calf-Lat mall 4.2  < 4.6 5  > 3 Calf-Lat mall 14     H-Reflex Results    M-Lat  H Lat H Neg Amp H-M Lat  Site (ms) (ms) Norm (mV) (ms)  Left Tibial H-Reflex  Pop fossa 5.8 33.1  < 35.0 3.4 27.3  Right Tibial H-Reflex  Pop fossa 5.7 33.4  < 35.0 1.95 27.7   Electromyography   Side Muscle Ins.Act Fibs Fasc Recrt Amp Dur Poly Activation Comment  Right Tib ant Nml Nml Nml Nml Nml Nml Nml Nml N/A  Right Gastroc MH Nml Nml Nml Nml Nml Nml Nml Nml N/A  Right Rectus fem Nml Nml Nml Nml Nml Nml Nml Nml N/A  Right Biceps fem SH Nml Nml Nml Nml Nml Nml Nml Nml N/A  Right Gluteus med Nml Nml Nml Nml Nml Nml Nml Nml N/A  Left Tib ant Nml Nml Nml *1- *1+ *1+ *1+ Nml N/A  Left Gastroc MH Nml Nml Nml Nml Nml Nml Nml Nml N/A  Left FDL Nml Nml Nml *1- *1+ *1+ *1+ *2- N/A  Left Rectus fem Nml Nml Nml Nml Nml Nml Nml Nml N/A  Left Biceps fem SH Nml Nml Nml Nml Nml Nml Nml Nml N/A  Left Gluteus med Nml Nml Nml Nml Nml Nml Nml Nml N/A  Left L5 PSP Nml Nml Nml Nml Nml Nml Nml Nml N/A      Waveforms:  Motor             Sensory            H-Reflex

## 2022-11-13 ENCOUNTER — Encounter: Payer: Self-pay | Admitting: Neurology

## 2022-11-13 ENCOUNTER — Telehealth: Payer: Self-pay | Admitting: Anesthesiology

## 2022-11-13 NOTE — Telephone Encounter (Signed)
Pt called stating she would like to know Dr Adaline Sill opinion on the MRI results. MRI done 10/07/2022.

## 2022-11-26 ENCOUNTER — Emergency Department (HOSPITAL_COMMUNITY): Payer: BC Managed Care – PPO

## 2022-11-26 ENCOUNTER — Emergency Department (HOSPITAL_COMMUNITY)
Admission: EM | Admit: 2022-11-26 | Discharge: 2022-11-26 | Disposition: A | Discharge status: Home / Self Care | Payer: BC Managed Care – PPO | Attending: Student in an Organized Health Care Education/Training Program | Admitting: Student in an Organized Health Care Education/Training Program

## 2022-11-26 DIAGNOSIS — W010XXA Fall on same level from slipping, tripping and stumbling without subsequent striking against object, initial encounter: Secondary | ICD-10-CM | POA: Diagnosis not present

## 2022-11-26 DIAGNOSIS — S6291XA Unspecified fracture of right wrist and hand, initial encounter for closed fracture: Secondary | ICD-10-CM | POA: Diagnosis not present

## 2022-11-26 DIAGNOSIS — S62101A Fracture of unspecified carpal bone, right wrist, initial encounter for closed fracture: Secondary | ICD-10-CM

## 2022-11-26 DIAGNOSIS — M25531 Pain in right wrist: Secondary | ICD-10-CM | POA: Diagnosis present

## 2022-11-26 MED ORDER — FENTANYL CITRATE PF 50 MCG/ML IJ SOSY
50.0000 ug | PREFILLED_SYRINGE | Freq: Once | INTRAMUSCULAR | Status: AC
  Administered 2022-11-26: 50 ug via INTRAVENOUS
  Filled 2022-11-26: qty 1

## 2022-11-26 MED ORDER — LIDOCAINE-EPINEPHRINE (PF) 2 %-1:200000 IJ SOLN
10.0000 mL | Freq: Once | INTRAMUSCULAR | Status: AC
  Administered 2022-11-26: 10 mL via INTRADERMAL
  Filled 2022-11-26: qty 20

## 2022-11-26 MED ORDER — MIDAZOLAM HCL 2 MG/2ML IJ SOLN
3.0000 mg | Freq: Once | INTRAMUSCULAR | Status: AC
  Administered 2022-11-26: 3 mg via INTRAVENOUS
  Filled 2022-11-26: qty 4

## 2022-11-26 MED ORDER — OXYCODONE-ACETAMINOPHEN 5-325 MG PO TABS
2.0000 | ORAL_TABLET | Freq: Once | ORAL | Status: AC
  Administered 2022-11-26: 2 via ORAL
  Filled 2022-11-26: qty 2

## 2022-11-26 MED ORDER — OXYCODONE-ACETAMINOPHEN 5-325 MG PO TABS: 1.0000 | ORAL_TABLET | Freq: Four times a day (QID) | ORAL | 0 refills | Status: AC | PRN

## 2022-11-26 NOTE — ED Triage Notes (Signed)
BIB EMS ground level trip and fall, c/o pain to right wrist. Denies LOC, head injury no dizziness prior to fall.

## 2022-11-26 NOTE — ED Provider Notes (Signed)
[place] EMERGENCY DEPARTMENT AT [facility] Provider Note   CSN: [id number] Arrival date & time: 11/26/22  1346     History Chief Complaint  Patient presents with   Marletta Lor    Kristin Bass is a 62 y.o. female patient who presents to the emergency department today for further evaluation of right wrist and arm pain after a fall via FOOSH mechanism.  Patient states that she was stepping down to couple of steps going into her garage when she lost her balance fell forward and try to catch herself.  Since then she has had immediate right-sided wrist pain.  She did not hit her head or lose consciousness.  She denies any other injury.   Fall       Home Medications Prior to Admission medications   Medication Sig Start Date End Date Taking? Authorizing Provider  oxyCODONE-acetaminophen (PERCOCET/ROXICET) 5-325 MG tablet Take 1-2 tablets by mouth every 6 (six) hours as needed for up to 5 days for severe pain. 11/26/22 12/01/22 Yes Lethia Donlon M, PA-C  acetaminophen (TYLENOL) 500 MG tablet Take 500 mg by mouth every 6 (six) hours as needed for headache.    [provider]  atomoxetine (STRATTERA) 10 MG capsule Take 20 mg by mouth daily.    [provider]  Bioflavonoid Products (BIOFLEX PO) Take by mouth.    [provider]  Calcium Carbonate-Vitamin D (CALCIUM + D PO) Take 1 tablet by mouth daily.    [provider]  Docusate Calcium (STOOL SOFTENER PO) Apply 3 capsules topically at bedtime.     [provider]  EPINEPHrine 0.3 mg/0.3 mL IJ SOAJ injection Inject 0.3 mg into the muscle as needed for anaphylaxis. 02/10/21   Marcelyn Bruins, MD  estradiol (ESTRACE) 0.1 MG/GM vaginal cream 1 gram vaginally twice weekly 01/26/22   Genia Del, MD  fluconazole (DIFLUCAN) 150 MG tablet Take one tablet every other day for x 3 days. Patient not taking: Reported on 09/18/2022 02/23/22   Genia Del, MD  ibuprofen (ADVIL) 200 MG  tablet Take 200 mg by mouth every 6 (six) hours as needed for headache or moderate pain.    [provider]  LORATADINE PO Take by mouth. Non Drowsy    [provider]  Magnesium 250 MG TABS 1 tablet with a meal    [provider]  montelukast (SINGULAIR) 10 MG tablet Take 1 tablet by mouth daily. 09/16/21   [provider]  Omega-3 Fatty Acids (FISH OIL PO) Take 2 capsules by mouth every evening.     [provider]  venlafaxine XR (EFFEXOR-XR) 150 MG 24 hr capsule Take 150 mg by mouth daily.  12/28/16   [provider]      Allergies    Other, Ciprofibrate, Ciprofloxacin, Covid-19 mrna vacc (moderna), Macrobid [nitrofurantoin], and Septra [sulfamethoxazole-trimethoprim]    Review of Systems   Review of Systems  All other systems reviewed and are negative.   Physical Exam Updated Vital Signs BP 111/88   Pulse 81   Temp 98.5 F (36.9 C) (Oral)   Resp 17   LMP 01/18/2012   SpO2 99%  Physical Exam Vitals and nursing note reviewed.  Constitutional:      General: She is not in acute distress.    Appearance: Normal appearance.  HENT:     Head: Normocephalic and atraumatic.  Eyes:     General:        Right eye: No discharge.  Left eye: No discharge.  Cardiovascular:     Comments: Regular rate and rhythm.  S1/S2 are distinct without any evidence of murmur, rubs, or gallops.  Radial pulses are 2+ bilaterally.  Dorsalis pedis pulses are 2+ bilaterally.  No evidence of pedal edema. Pulmonary:     Comments: Clear to auscultation bilaterally.  Normal effort.  No respiratory distress.  No evidence of wheezes, rales, or rhonchi heard throughout. Abdominal:     General: Abdomen is flat. Bowel sounds are normal. There is no distension.     Tenderness: There is no abdominal tenderness. There is no guarding or rebound.  Musculoskeletal:        General: Normal range of motion.     Cervical back: Neck supple.     Comments: Obvious  deformity to the right wrist.  2+ radial pulse felt on the right.  Good cap refill and sensation distally. Full ROM in the right elbow.   Skin:    General: Skin is warm and dry.     Findings: No rash.  Neurological:     General: No focal deficit present.     Mental Status: She is alert.  Psychiatric:        Mood and Affect: Mood normal.        Behavior: Behavior normal.     ED Results / Procedures / Treatments   Labs (all labs ordered are listed, but only abnormal results are displayed) Labs Reviewed - No data to display  EKG None  Radiology DG Wrist 2 Views Right  Result Date: 11/26/2022 CLINICAL DATA:  Status post reduction, patient is in traction. EXAM: RIGHT WRIST - 2 VIEW COMPARISON:  Wrist radiograph performed earlier on the same date. FINDINGS: Status post reduction radiographs demonstrate significantly improved alignment of the distal radial fracture with minimal dorsal angulation. Marked soft tissue swelling about the wrist. IMPRESSION: Status post reduction radiographs demonstrate significantly improved alignment of the distal radial fracture now in near anatomical alignment. Electronically Signed   By: Larose Hires D.O.   On: 11/26/2022 17:17   DG Elbow Complete Right  Result Date: 11/26/2022 CLINICAL DATA:  fall via FOOSh mechanism EXAM: RIGHT ELBOW - COMPLETE 3+ VIEW COMPARISON:  None Available. FINDINGS: A true lateral view was unable to be obtained secondary to patient discomfort/pain. There is no evidence of acute fracture. Alignment is normal. There are few soft tissue calcifications which could be phleboliths. IMPRESSION: No evidence of acute fracture on limited exam. A true lateral view was unable to be obtained secondary to patient discomfort/pain. If there is persistent clinical concern, repeat exam could be performed with true lateral view after the patient's pain is tolerable. Electronically Signed   By: Caprice Renshaw M.D.   On: 11/26/2022 16:02   DG Wrist Complete  Right  Result Date: 11/26/2022 CLINICAL DATA:  right wrist pain via foosh mechanism; 190176 Fall 190176 EXAM: RIGHT WRIST - COMPLETE 3+ VIEW; RIGHT HAND - COMPLETE 3+ VIEW COMPARISON:  Right hand radiograph 07/02/2021 FINDINGS: There is a dorsally angulated and displaced distal radial metaphyseal fracture with probable intra-articular extension. Normal radiocarpal and intercarpal alignment. Probable normal DRUJ alignment. Tiny avulsion fracture of the ulnar styloid. There is soft tissue swelling of the right wrist. There is no evidence of additional fracture in the hand. IMPRESSION: Dorsally angulated and displaced distal radial metaphyseal fracture with probable intra-articular extension. Tiny ulnar styloid avulsion fracture. No evidence of additional fracture in the hand. Electronically Signed   By: Erma Heritage.D.  On: 11/26/2022 15:46   DG Hand Complete Right  Result Date: 11/26/2022 CLINICAL DATA:  right wrist pain via foosh mechanism; 190176 Fall 190176 EXAM: RIGHT WRIST - COMPLETE 3+ VIEW; RIGHT HAND - COMPLETE 3+ VIEW COMPARISON:  Right hand radiograph 07/02/2021 FINDINGS: There is a dorsally angulated and displaced distal radial metaphyseal fracture with probable intra-articular extension. Normal radiocarpal and intercarpal alignment. Probable normal DRUJ alignment. Tiny avulsion fracture of the ulnar styloid. There is soft tissue swelling of the right wrist. There is no evidence of additional fracture in the hand. IMPRESSION: Dorsally angulated and displaced distal radial metaphyseal fracture with probable intra-articular extension. Tiny ulnar styloid avulsion fracture. No evidence of additional fracture in the hand. Electronically Signed   By: Caprice Renshaw M.D.   On: 11/26/2022 15:46    Procedures .Ortho Injury Treatment  Date/Time: 11/26/2022 5:38 PM  Performed by: Teressa Lower, PA-C Authorized by: Teressa Lower, PA-C   Consent:    Consent obtained:  Verbal   Consent given by:   Patient   Risks discussed:  Fracture and irreducible dislocationInjury location: upper arm (Right wrist) Injury type: fracture-dislocation Pre-procedure neurovascular assessment: neurovascularly intact Pre-procedure distal perfusion: normal Pre-procedure neurological function: normal Pre-procedure range of motion: normal Anesthesia: hematoma block  Anesthesia: Local anesthesia used: yes Local Anesthetic: lidocaine 1% with epinephrine  Patient sedated: NoManipulation performed: yes Skeletal traction used: yes Reduction successful: yes X-ray confirmed reduction: yes Immobilization: splint Splint type: sugar tong Splint Applied by: Ortho Tech Supplies used: Ortho-Glass Post-procedure neurovascular assessment: post-procedure neurovascularly intact Post-procedure distal perfusion: normal Post-procedure neurological function: normal Post-procedure range of motion: normal       Medications Ordered in ED Medications  oxyCODONE-acetaminophen (PERCOCET/ROXICET) 5-325 MG per tablet 2 tablet (2 tablets Oral Given 11/26/22 1434)  lidocaine-EPINEPHrine (XYLOCAINE W/EPI) 2 %-1:200000 (PF) injection 10 mL (10 mLs Intradermal Given 11/26/22 1647)  fentaNYL (SUBLIMAZE) injection 50 mcg (50 mcg Intravenous Given 11/26/22 1626)  midazolam (VERSED) injection 3 mg (3 mg Intravenous Given 11/26/22 1645)    ED Course/ Medical Decision Making/ A&P Clinical Course as of 11/26/22 1817  Thu Nov 26, 2022  1609 I spoke with Charma Igo Orthopedic PA who recommends reduction, sugar-tong splint, and follow-up with Kuzma next week. [CF]  1714 I reviewed imaging of the right hand, right wrist, and right elbow.  There is an obvious deformity over the right wrist. [CF]  1714 I evaluated the postreduction x-ray after traction and the wrist is in good anatomical alignment. [CF]    Clinical Course User Index [CF] Teressa Lower, PA-C   {   Click here for ABCD2, HEART and other calculators  Medical  Decision Making Theresa Lutts is a 62 y.o. female patient who presents to the emergency department today for further evaluation of right wrist pain with an obvious deformity.  Will plan to get imaging and give her pain medication and will reassess once imaging returns.  Imaging confirms fracture with misalignment.  I reduced the wrist after performing hematoma block over the area and skeletal traction.  Please see procedure note for further detail.  Reduction was successful which was confirmed with x-ray.  Given the patient's some chronic pain medication to go home with that she will follow-up with orthopedics early next week.  Strict turn precautions were discussed.  She is safe to discharge.   Amount and/or Complexity of Data Reviewed Radiology: ordered.  Risk Prescription drug management.    Final Clinical Impression(s) / ED Diagnoses Final diagnoses:  Closed  fracture of right wrist, initial encounter    Rx / DC Orders ED Discharge Orders          Ordered    oxyCODONE-acetaminophen (PERCOCET/ROXICET) 5-325 MG tablet  Every 6 hours PRN        11/26/22 1719              Teressa Lower, PA-C 11/26/22 1817    Lowther, Amy, DO 12/01/22 1740

## 2022-11-26 NOTE — Discharge Instructions (Signed)
I have written you for some narcotic pain medication which she can take for breakthrough pain.  I would like for you to take 600 mg of ibuprofen every 6 hours as needed for pain.  You are going to follow-up with Dr. Merlyn Lot next week.  Please call tomorrow to schedule an appointment.  You may return to the emergency room for any worsening symptoms prior to that appointment.

## 2022-12-01 ENCOUNTER — Encounter (HOSPITAL_BASED_OUTPATIENT_CLINIC_OR_DEPARTMENT_OTHER): Payer: Self-pay | Admitting: Orthopedic Surgery

## 2022-12-01 ENCOUNTER — Other Ambulatory Visit: Payer: Self-pay

## 2022-12-01 ENCOUNTER — Other Ambulatory Visit: Payer: Self-pay | Admitting: Orthopedic Surgery

## 2022-12-02 NOTE — Progress Notes (Signed)
       Patient Instructions  The night before surgery:  No food after midnight. ONLY clear liquids after midnight  The day of surgery (if you do NOT have diabetes):  Drink ONE (1) Pre-Surgery Clear Ensure as directed.   This drink was given to you during your hospital  pre-op appointment visit. The pre-op nurse will instruct you on the time to drink the  Pre-Surgery Ensure depending on your surgery time. Finish the drink at the designated time by the pre-op nurse.  Nothing else to drink after completing the  Pre-Surgery Clear Ensure.  The day of surgery (if you have diabetes): Drink ONE (1) Gatorade 2 (G2) as directed. This drink was given to you during your hospital  pre-op appointment visit.  The pre-op nurse will instruct you on the time to drink the   Gatorade 2 (G2) depending on your surgery time. Color of the Gatorade may vary. Red is not allowed. Nothing else to drink after completing the  Gatorade 2 (G2).         If you have questions, please contact your surgeon's office.      Patient Instructions  The night before surgery:  No food after midnight. ONLY clear liquids after midnight  The day of surgery (if you do NOT have diabetes):  Drink ONE (1) Pre-Surgery Clear Ensure as directed.   This drink was given to you during your hospital  pre-op appointment visit. The pre-op nurse will instruct you on the time to drink the  Pre-Surgery Ensure depending on your surgery time. Finish the drink at the designated time by the pre-op nurse.  Nothing else to drink after completing the  Pre-Surgery Clear Ensure.  The day of surgery (if you have diabetes): Drink ONE (1) Gatorade 2 (G2) as directed. This drink was given to you during your hospital  pre-op appointment visit.  The pre-op nurse will instruct you on the time to drink the   Gatorade 2 (G2) depending on your surgery time. Color of the Gatorade may vary. Red is not allowed. Nothing else to drink after  completing the  Gatorade 2 (G2).         If you have questions, please contact your surgeon's office.   

## 2022-12-03 ENCOUNTER — Ambulatory Visit (HOSPITAL_BASED_OUTPATIENT_CLINIC_OR_DEPARTMENT_OTHER)
Admission: RE | Admit: 2022-12-03 | Discharge: 2022-12-03 | Disposition: A | Discharge status: Home / Self Care | Payer: BC Managed Care – PPO | Attending: Orthopedic Surgery | Admitting: Orthopedic Surgery

## 2022-12-03 ENCOUNTER — Encounter (HOSPITAL_BASED_OUTPATIENT_CLINIC_OR_DEPARTMENT_OTHER)
Admission: RE | Disposition: A | Discharge status: Home / Self Care | Payer: Self-pay | Source: Home / Self Care | Attending: Orthopedic Surgery

## 2022-12-03 ENCOUNTER — Ambulatory Visit (HOSPITAL_BASED_OUTPATIENT_CLINIC_OR_DEPARTMENT_OTHER): Payer: BC Managed Care – PPO | Admitting: Anesthesiology

## 2022-12-03 ENCOUNTER — Other Ambulatory Visit: Payer: Self-pay

## 2022-12-03 ENCOUNTER — Encounter (HOSPITAL_BASED_OUTPATIENT_CLINIC_OR_DEPARTMENT_OTHER): Payer: Self-pay | Admitting: Orthopedic Surgery

## 2022-12-03 ENCOUNTER — Ambulatory Visit (HOSPITAL_BASED_OUTPATIENT_CLINIC_OR_DEPARTMENT_OTHER): Payer: BC Managed Care – PPO

## 2022-12-03 DIAGNOSIS — Z01818 Encounter for other preprocedural examination: Secondary | ICD-10-CM

## 2022-12-03 DIAGNOSIS — Z09 Encounter for follow-up examination after completed treatment for conditions other than malignant neoplasm: Secondary | ICD-10-CM | POA: Diagnosis not present

## 2022-12-03 DIAGNOSIS — S52571A Other intraarticular fracture of lower end of right radius, initial encounter for closed fracture: Secondary | ICD-10-CM | POA: Insufficient documentation

## 2022-12-03 DIAGNOSIS — G4730 Sleep apnea, unspecified: Secondary | ICD-10-CM | POA: Insufficient documentation

## 2022-12-03 DIAGNOSIS — Z87891 Personal history of nicotine dependence: Secondary | ICD-10-CM | POA: Diagnosis not present

## 2022-12-03 DIAGNOSIS — F419 Anxiety disorder, unspecified: Secondary | ICD-10-CM | POA: Diagnosis not present

## 2022-12-03 DIAGNOSIS — W1830XA Fall on same level, unspecified, initial encounter: Secondary | ICD-10-CM | POA: Insufficient documentation

## 2022-12-03 HISTORY — PX: FLEXOR TENOTOMY: SHX6342

## 2022-12-03 HISTORY — PX: OPEN REDUCTION INTERNAL FIXATION (ORIF) DISTAL RADIAL FRACTURE: SHX5989

## 2022-12-03 HISTORY — DX: Venous insufficiency (chronic) (peripheral): I87.2

## 2022-12-03 HISTORY — DX: Sleep apnea, unspecified: G47.30

## 2022-12-03 HISTORY — DX: Unspecified hearing loss, unspecified ear: H91.90

## 2022-12-03 SURGERY — OPEN REDUCTION INTERNAL FIXATION (ORIF) DISTAL RADIUS FRACTURE
Anesthesia: Regional | Site: Wrist | Laterality: Right

## 2022-12-03 MED ORDER — CEFAZOLIN SODIUM-DEXTROSE 2-4 GM/100ML-% IV SOLN
INTRAVENOUS | Status: AC
  Filled 2022-12-03: qty 100

## 2022-12-03 MED ORDER — ROPIVACAINE HCL 5 MG/ML IJ SOLN
INTRAMUSCULAR | Status: DC | PRN
  Administered 2022-12-03: 30 mL via PERINEURAL

## 2022-12-03 MED ORDER — DEXAMETHASONE SODIUM PHOSPHATE 10 MG/ML IJ SOLN
INTRAMUSCULAR | Status: DC | PRN
  Administered 2022-12-03: 10 mg

## 2022-12-03 MED ORDER — ACETAMINOPHEN 500 MG PO TABS
ORAL_TABLET | ORAL | Status: AC
  Filled 2022-12-03: qty 2

## 2022-12-03 MED ORDER — FENTANYL CITRATE (PF) 100 MCG/2ML IJ SOLN
INTRAMUSCULAR | Status: AC
  Filled 2022-12-03: qty 2

## 2022-12-03 MED ORDER — FENTANYL CITRATE (PF) 100 MCG/2ML IJ SOLN
50.0000 ug | Freq: Once | INTRAMUSCULAR | Status: AC
  Administered 2022-12-03: 50 ug via INTRAVENOUS

## 2022-12-03 MED ORDER — PROPOFOL 10 MG/ML IV BOLUS
INTRAVENOUS | Status: DC | PRN
  Administered 2022-12-03: 150 mg via INTRAVENOUS

## 2022-12-03 MED ORDER — EPHEDRINE SULFATE (PRESSORS) 50 MG/ML IJ SOLN
INTRAMUSCULAR | Status: DC | PRN
  Administered 2022-12-03: 5 mg via INTRAVENOUS
  Administered 2022-12-03: 10 mg via INTRAVENOUS
  Administered 2022-12-03: 5 mg via INTRAVENOUS

## 2022-12-03 MED ORDER — DEXAMETHASONE SODIUM PHOSPHATE 10 MG/ML IJ SOLN
INTRAMUSCULAR | Status: DC | PRN
  Administered 2022-12-03: 10 mg via INTRAVENOUS

## 2022-12-03 MED ORDER — PROPOFOL 10 MG/ML IV BOLUS
INTRAVENOUS | Status: AC
  Filled 2022-12-03: qty 20

## 2022-12-03 MED ORDER — LACTATED RINGERS IV SOLN: INTRAVENOUS | Status: DC

## 2022-12-03 MED ORDER — ONDANSETRON HCL 4 MG/2ML IJ SOLN
INTRAMUSCULAR | Status: DC | PRN
  Administered 2022-12-03: 4 mg via INTRAVENOUS

## 2022-12-03 MED ORDER — OXYCODONE-ACETAMINOPHEN 5-325 MG PO TABS: ORAL_TABLET | ORAL | 0 refills | Status: DC

## 2022-12-03 MED ORDER — FENTANYL CITRATE (PF) 100 MCG/2ML IJ SOLN: 25.0000 ug | INTRAMUSCULAR | Status: DC | PRN

## 2022-12-03 MED ORDER — LIDOCAINE 2% (20 MG/ML) 5 ML SYRINGE
INTRAMUSCULAR | Status: DC | PRN
  Administered 2022-12-03: 60 mg via INTRAVENOUS

## 2022-12-03 MED ORDER — 0.9 % SODIUM CHLORIDE (POUR BTL) OPTIME
TOPICAL | Status: DC | PRN
  Administered 2022-12-03: 75 mL

## 2022-12-03 MED ORDER — CEFAZOLIN SODIUM-DEXTROSE 2-4 GM/100ML-% IV SOLN
2.0000 g | INTRAVENOUS | Status: AC
  Administered 2022-12-03: 2 g via INTRAVENOUS

## 2022-12-03 MED ORDER — MIDAZOLAM HCL 2 MG/2ML IJ SOLN
INTRAMUSCULAR | Status: AC
  Filled 2022-12-03: qty 2

## 2022-12-03 MED ORDER — MIDAZOLAM HCL 2 MG/2ML IJ SOLN
2.0000 mg | Freq: Once | INTRAMUSCULAR | Status: AC
  Administered 2022-12-03: 2 mg via INTRAVENOUS

## 2022-12-03 MED ORDER — ACETAMINOPHEN 500 MG PO TABS
1000.0000 mg | ORAL_TABLET | Freq: Once | ORAL | Status: AC
  Administered 2022-12-03: 1000 mg via ORAL

## 2022-12-03 MED ORDER — PROMETHAZINE HCL 25 MG/ML IJ SOLN: 6.2500 mg | INTRAMUSCULAR | Status: DC | PRN

## 2022-12-03 SURGICAL SUPPLY — 66 items
APL PRP STRL LF DISP 70% ISPRP (MISCELLANEOUS) ×1
BIT DRILL 2.0 LNG QUCK RELEASE (BIT) IMPLANT
BIT DRILL QC 2.8X5 (BIT) IMPLANT
BLADE SURG 15 STRL LF DISP TIS (BLADE) ×2 IMPLANT
BLADE SURG 15 STRL SS (BLADE) ×2
BNDG CMPR 5X3 KNIT ELC UNQ LF (GAUZE/BANDAGES/DRESSINGS) ×1
BNDG CMPR 9X4 STRL LF SNTH (GAUZE/BANDAGES/DRESSINGS) ×1
BNDG ELASTIC 3INX 5YD STR LF (GAUZE/BANDAGES/DRESSINGS) ×1 IMPLANT
BNDG ESMARK 4X9 LF (GAUZE/BANDAGES/DRESSINGS) ×1 IMPLANT
BNDG GAUZE DERMACEA FLUFF 4 (GAUZE/BANDAGES/DRESSINGS) ×1 IMPLANT
BNDG GZE DERMACEA 4 6PLY (GAUZE/BANDAGES/DRESSINGS) ×1
BNDG PLASTER X FAST 3X3 WHT LF (CAST SUPPLIES) ×10 IMPLANT
BNDG PLSTR 9X3 FST ST WHT (CAST SUPPLIES) ×10
CHLORAPREP W/TINT 26 (MISCELLANEOUS) ×1 IMPLANT
CORD BIPOLAR FORCEPS 12FT (ELECTRODE) ×1 IMPLANT
COVER BACK TABLE 60X90IN (DRAPES) ×1 IMPLANT
COVER MAYO STAND STRL (DRAPES) ×1 IMPLANT
CUFF TOURN SGL QUICK 18X4 (TOURNIQUET CUFF) IMPLANT
CUFF TOURN SGL QUICK 24 (TOURNIQUET CUFF)
CUFF TRNQT CYL 24X4X16.5-23 (TOURNIQUET CUFF) IMPLANT
DRAPE EXTREMITY T 121X128X90 (DISPOSABLE) ×1 IMPLANT
DRAPE OEC MINIVIEW 54X84 (DRAPES) ×1 IMPLANT
DRAPE SURG 17X23 STRL (DRAPES) ×1 IMPLANT
DRILL 2.0 LNG QUICK RELEASE (BIT) ×1
GAUZE SPONGE 4X4 12PLY STRL (GAUZE/BANDAGES/DRESSINGS) ×1 IMPLANT
GAUZE XEROFORM 1X8 LF (GAUZE/BANDAGES/DRESSINGS) ×1 IMPLANT
GLOVE BIO SURGEON STRL SZ7.5 (GLOVE) ×1 IMPLANT
GLOVE BIOGEL PI IND STRL 7.0 (GLOVE) IMPLANT
GLOVE BIOGEL PI IND STRL 7.5 (GLOVE) IMPLANT
GLOVE BIOGEL PI IND STRL 8 (GLOVE) ×1 IMPLANT
GLOVE BIOGEL PI IND STRL 8.5 (GLOVE) IMPLANT
GLOVE SURG ORTHO 8.0 STRL STRW (GLOVE) IMPLANT
GLOVE SURG SS PI 7.0 STRL IVOR (GLOVE) IMPLANT
GLOVE SURG SYN 7.5  E (GLOVE) ×1
GLOVE SURG SYN 7.5 E (GLOVE) ×1 IMPLANT
GLOVE SURG SYN 7.5 PF PI (GLOVE) IMPLANT
GOWN STRL REUS W/ TWL LRG LVL3 (GOWN DISPOSABLE) ×1 IMPLANT
GOWN STRL REUS W/ TWL XL LVL3 (GOWN DISPOSABLE) IMPLANT
GOWN STRL REUS W/TWL LRG LVL3 (GOWN DISPOSABLE)
GOWN STRL REUS W/TWL XL LVL3 (GOWN DISPOSABLE) ×3 IMPLANT
GUIDEWIRE ORTHO 0.054X6 (WIRE) IMPLANT
NDL HYPO 25X1 1.5 SAFETY (NEEDLE) IMPLANT
NEEDLE HYPO 25X1 1.5 SAFETY (NEEDLE) IMPLANT
NS IRRIG 1000ML POUR BTL (IV SOLUTION) ×1 IMPLANT
PACK BASIN DAY SURGERY FS (CUSTOM PROCEDURE TRAY) ×1 IMPLANT
PAD CAST 3X4 CTTN HI CHSV (CAST SUPPLIES) ×1 IMPLANT
PADDING CAST COTTON 3X4 STRL (CAST SUPPLIES)
PLATE PROXIMAL VDU ACULOC (Plate) IMPLANT
SCREW BN FT 16X2.3XLCK HEX CRT (Screw) IMPLANT
SCREW CORTICAL LOCKING 2.3X16M (Screw) ×2 IMPLANT
SCREW CORTICAL LOCKING 2.3X18M (Screw) ×2 IMPLANT
SCREW CORTICAL LOCKING 2.3X20M (Screw) ×2 IMPLANT
SCREW FX18X2.3XSMTH LCK NS CRT (Screw) IMPLANT
SCREW FX20X2.3XSMTH LCK NS CRT (Screw) IMPLANT
SCREW HEXALOBE NON-LOCK 3.5X14 (Screw) IMPLANT
SCREW NLCKG 13 3.5X13 HEXA (Screw) IMPLANT
SCREW NON-LOCK 3.5X13 (Screw) ×1 IMPLANT
SCREW NONLOCK HEX 3.5X12 (Screw) IMPLANT
SLEEVE SCD COMPRESS KNEE MED (STOCKING) IMPLANT
STOCKINETTE 4X48 STRL (DRAPES) ×1 IMPLANT
SUT ETHILON 4 0 PS 2 18 (SUTURE) ×1 IMPLANT
SUT VIC AB 4-0 PS2 18 (SUTURE) ×1 IMPLANT
SYR BULB EAR ULCER 3OZ GRN STR (SYRINGE) ×1 IMPLANT
SYR CONTROL 10ML LL (SYRINGE) IMPLANT
TOWEL GREEN STERILE FF (TOWEL DISPOSABLE) ×2 IMPLANT
UNDERPAD 30X36 HEAVY ABSORB (UNDERPADS AND DIAPERS) ×1 IMPLANT

## 2022-12-03 NOTE — Anesthesia Preprocedure Evaluation (Addendum)
Anesthesia Evaluation  Patient identified by MRN, date of birth, ID band Patient awake    Reviewed: Allergy & Precautions, NPO status , Patient's Chart, lab work & pertinent test results  Airway Mallampati: II  TM Distance: >3 FB Neck ROM: Full    Dental  (+) Teeth Intact, Dental Advisory Given   Pulmonary sleep apnea , former smoker   Pulmonary exam normal breath sounds clear to auscultation       Cardiovascular negative cardio ROS Normal cardiovascular exam Rhythm:Regular Rate:Normal     Neuro/Psych  PSYCHIATRIC DISORDERS Anxiety      Neuromuscular disease    GI/Hepatic negative GI ROS, Neg liver ROS,,,  Endo/Other  negative endocrine ROS    Renal/GU negative Renal ROS     Musculoskeletal RIGHT DISTAL RADIUS FRACTURE   Abdominal   Peds  Hematology negative hematology ROS (+)   Anesthesia Other Findings Day of surgery medications reviewed with the patient.  Reproductive/Obstetrics                             Anesthesia Physical Anesthesia Plan  ASA: 2  Anesthesia Plan: General   Post-op Pain Management: Regional block* and Tylenol PO (pre-op)*   Induction: Intravenous  PONV Risk Score and Plan: 3 and Midazolam, Dexamethasone and Ondansetron  Airway Management Planned: LMA  Additional Equipment:   Intra-op Plan:   Post-operative Plan: Extubation in OR  Informed Consent: I have reviewed the patients History and Physical, chart, labs and discussed the procedure including the risks, benefits and alternatives for the proposed anesthesia with the patient or authorized representative who has indicated his/her understanding and acceptance.     Dental advisory given  Plan Discussed with: CRNA  Anesthesia Plan Comments:        Anesthesia Quick Evaluation

## 2022-12-03 NOTE — Op Note (Signed)
12/03/2022 [facility]  Operative Note  Pre Op Diagnosis: Right comminuted intraarticular distal radius fracture  Post Op Diagnosis: Right comminuted intraarticular distal radius fracture  Procedure:  ORIF right comminuted intraarticular distal radius fracture, 2 intraarticular fragments Right brachioradialis release  Surgeon: Betha Loa, MD  Assistant: Cindee Salt, MD  Anesthesia: General with regional  Fluids: Per anesthesia flow sheet  EBL: minimal  Complications: None  Specimen: None  Tourniquet Time:  Total Tourniquet Time Documented: Upper Arm (Right) - 40 minutes Total: Upper Arm (Right) - 40 minutes   Disposition: Stable to PACU  INDICATIONS:  Kristin Bass is a 62 y.o. female states she fell last week injuring right wrist.  Seen in ED where XR revealed right distal radius fracture.  We discussed nonoperative and operative treatment options.  She wished to proceed with operative fixation.  Risks, benefits, and alternatives of surgery were discussed including the risk of blood loss; infection; damage to nerves, vessels, tendons, ligaments, bone; failure of surgery; need for additional surgery; complications with wound healing; continued pain; nonunion; malunion; stiffness.  We also discussed the possible need for bone graft and the benefits and risks including the possibility of disease transmission.  She voiced understanding of these risks and elected to proceed.    OPERATIVE COURSE:  After being identified preoperatively by myself, the patient and I agreed upon the procedure and site of procedure.  Surgical site was marked.   Surgical consent had been signed.  She was given IV Ancef as preoperative antibiotic prophylaxis.  She was transferred to the operating room and placed on the operating room table in supine position with the Right Right upper extremity on an armboard. General anesthesia was induced by the anesthesiologist.  A regional block had been  performed by anesthesia in preoperative holding.  The Right Right upper extremity was prepped and draped in normal sterile orthopedic fashion.  A surgical pause was performed between the surgeons, anesthesia and operating room staff, and all were in agreement as to the patient, procedure and site of procedure.  Tourniquet at the proximal aspect of the extremity was inflated to 250 mmHg after exsanguination of the limb with an Esmarch bandage.  Standard volar Sherilyn Cooter approach was used.  The bipolar electrocautery was used to obtain hemostasis.  The superficial and deep portions of the FCR tendon sheath were incised, and the FCR and FPL were swept ulnarly to protect the palmar cutaneous branch of the median nerve.  The brachioradialis was released at the radial side of the radius.  The pronator quadratus was released and elevated with the periosteal elevator.  The fracture site was identified and cleared of soft tissue interposition and hematoma.  It was reduced under direct visualization.  There was intraarticular extension creating two intraarticular fragments.  An AcuMed volar distal radial locking plate was selected.  It was secured to the bone with the guidepins.  C-arm was used in AP and lateral projections to ensure appropriate reduction and position of the hardware and adjustments made as necessary.  Standard AO drilling and measuring technique was used.  A single screw was placed in the slotted hole in the shaft of the plate.  The distal holes were filled with locking pegs with the exception of the styloid holes, which were filled with locking screws.  The remaining holes in the shaft of the plate were filled with nonlocking screws.  Good purchase was obtained.  C-arm was used in AP, lateral and oblique projections to ensure appropriate  reduction and position of hardware, which was the case.  There was no intra-articular penetration of hardware.  The wound was copiously irrigated with sterile saline.  Pronator  quadratus was repaired back over top of the plate using 4-0 Vicryl suture.  Vicryl suture was placed in the subcutaneous tissues in an inverted interrupted fashion and the skin was closed with 4-0 nylon in a horizontal mattress fashion.  There was good pronation and supination of the wrist without crepitance.  The wound was then dressed with sterile Xeroform, 4x4s, and wrapped with a Kerlix bandage.  A volar splint was placed and wrapped with Kerlix and Ace bandage.  Tourniquet was deflated at 40 minutes.  Fingertips were pink with brisk capillary refill after deflation of the tourniquet.  Operative drapes were broken down.  The patient was awoken from anesthesia safely.  She was transferred back to the stretcher and taken to the PACU in stable condition.  I will see her back in the office in one week for postoperative followup.  I will give her a prescription for Percocet 5/325 1-2 tabs PO q6 hours prn pain, dispense # 20.    Betha Loa, MD Electronically signed, 12/03/22

## 2022-12-03 NOTE — Anesthesia Postprocedure Evaluation (Signed)
Anesthesia Post Note  Patient: Arshiya Penman  Procedure(s) Performed: OPEN REDUCTION INTERNAL FIXATION (ORIF) RIGHT DISTAL RADIUS FRACTURE (Right: Wrist) RIGHT BRACHIORADIALIS RELEASE (Right: Wrist)     Patient location during evaluation: PACU Anesthesia Type: General Level of consciousness: awake and alert Pain management: pain level controlled Vital Signs Assessment: post-procedure vital signs reviewed and stable Respiratory status: spontaneous breathing, nonlabored ventilation, respiratory function stable and patient connected to nasal cannula oxygen Cardiovascular status: blood pressure returned to baseline and stable Postop Assessment: no apparent nausea or vomiting Anesthetic complications: no   No notable events documented.  Last Vitals:  Vitals:   12/03/22 1404 12/03/22 1413  BP: 121/83 (!) 143/67  Pulse: 82 88  Resp: 13 16  Temp:  (!) 36.2 C  SpO2: 94% 97%    Last Pain:  Vitals:   12/03/22 1413  TempSrc: Temporal  PainSc: 0-No pain                 Collene Schlichter

## 2022-12-03 NOTE — Discharge Instructions (Addendum)
[facility] Instructions Hand Surgery  Wound Care: Keep your hand elevated above the level of your heart.  Do not allow it to dangle by your side.  Keep the dressing dry and do not remove it unless your doctor advises you to do so.  He will usually change it at the time of your post-op visit.  Moving your fingers is advised to stimulate circulation but will depend on the site of your surgery.  If you have a splint applied, your doctor will advise you regarding movement.  Activity: Do not drive or operate machinery today.  Rest today and then you may return to your normal activity and work as indicated by your physician.  Diet:  Drink liquids today or eat a light diet.  You may resume a regular diet tomorrow.    General expectations: Pain for two to three days. Fingers may become slightly swollen.  Call your doctor if any of the following occur: Severe pain not relieved by pain medication. Elevated temperature. Dressing soaked with blood. Inability to move fingers. White or bluish color to fingers.     Post Anesthesia Home Care Instructions  Activity: Get plenty of rest for the remainder of the day. A responsible individual must stay with you for 24 hours following the procedure.  For the next 24 hours, DO NOT: -Drive a car -Advertising copywriter -Drink alcoholic beverages -Take any medication unless instructed by your physician -Make any legal decisions or sign important papers.  Meals: Start with liquid foods such as gelatin or soup. Progress to regular foods as tolerated. Avoid greasy, spicy, heavy foods. If nausea and/or vomiting occur, drink only clear liquids until the nausea and/or vomiting subsides. Call your physician if vomiting continues.  Special Instructions/Symptoms: Your throat may feel dry or sore from the anesthesia or the breathing tube placed in your throat during surgery. If this causes discomfort, gargle with warm salt water. The discomfort should disappear  within 24 hours.  If you had a scopolamine patch placed behind your ear for the management of post- operative nausea and/or vomiting:  1. The medication in the patch is effective for 72 hours, after which it should be removed.  Wrap patch in a tissue and discard in the trash. Wash hands thoroughly with soap and water. 2. You may remove the patch earlier than 72 hours if you experience unpleasant side effects which may include dry mouth, dizziness or visual disturbances. 3. Avoid touching the patch. Wash your hands with soap and water after contact with the patch.       Regional Anesthesia Blocks  1. Numbness or the inability to move the "blocked" extremity may last from 3-48 hours after placement. The length of time depends on the medication injected and your individual response to the medication. If the numbness is not going away after 48 hours, call your surgeon.  2. The extremity that is blocked will need to be protected until the numbness is gone and the  Strength has returned. Because you cannot feel it, you will need to take extra care to avoid injury. Because it may be weak, you may have difficulty moving it or using it. You may not know what position it is in without looking at it while the block is in effect.  3. For blocks in the legs and feet, returning to weight bearing and walking needs to be done carefully. You will need to wait until the numbness is entirely gone and the strength has returned. You should be  able to move your leg and foot normally before you try and bear weight or walk. You will need someone to be with you when you first try to ensure you do not fall and possibly risk injury.  4. Bruising and tenderness at the needle site are common side effects and will resolve in a few days.  5. Persistent numbness or new problems with movement should be communicated to the surgeon or the [facility] [phone] [facility] [phone]).   Next  dose of Tylenol may be given at 5:30pm if needed.

## 2022-12-03 NOTE — Op Note (Signed)
I assisted Surgeon(s) and Role:    * Betha Loa, MD - Primary    Cindee Salt, MD - Assisting on the Procedure(s): OPEN REDUCTION INTERNAL FIXATION (ORIF) RIGHT DISTAL RADIUS FRACTURE RIGHT BRACHIORADIALIS RELEASE on 12/03/2022.  I provided assistance on this case as follows: Set up, approach, retraction for protection of the radial artery, release of the brachial radialis, identification of the fracture with debridement reduction and stabilization and fixation with plate and screws, closure of the wound and application of the dressing and splint.  Electronically signed by: Cindee Salt, MD Date: 12/03/2022 Time: 1:44 PM

## 2022-12-03 NOTE — H&P (Signed)
Kristin Bass is an 62 y.o. female.   Chief Complaint: wrist fracture HPI: 62 yo female states she fell from standing height 11/26/22 injuring right wrist.  Seen in ED where XR revealed right distal radius fracture.  Splinted and followed up in office.  She wishes to proceed with operative reduction and fixation.  Allergies:  Allergies  Allergen Reactions   Other Anaphylaxis and Hives    Covid Vaccine Proofreader) caused Low BP   Ciprofibrate     Other reaction(s): Unknown   Ciprofloxacin Other (See Comments)    Hypotension; taken at the same time as macrobid so pt is unaware which medication caused her reaction.  05/15/20 attempted oral challenge and developed tingling and heaviness sensation of extremities and lightheadedness without hypotension or vital sign changes.     Covid-19 Mrna Vacc (Moderna)     Other reaction(s): anaphylactic shock   Macrobid [Nitrofurantoin] Other (See Comments)    Hypotension; taken at the same time as cipro so pt is unaware which medication caused her reaction.   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   History of degenerative disc disease    mild   HOH (hard of hearing)    left   Infertility, female    Mild scoliosis    Myofascial pain syndrome    Precancerous lesion    Sleep apnea    Urticaria    Venous insufficiency    Wrist fracture     Past Surgical History:  Procedure Laterality Date   BREAST CYST EXCISION Left    CYSTECTOMY     from neck   DILATION AND CURETTAGE OF UTERUS     times 2   FOOT SURGERY Left 11/11   hammer toe & plantar fascitis   MOUTH SURGERY     bone graft   TONSILLECTOMY     VEIN LIGATION AND STRIPPING      Family History: Family History  Problem Relation Age of Onset   Colon cancer Mother    Hypertension Father    Anxiety disorder Father    Diabetes Maternal Grandfather    Hypertension Maternal Grandmother     COPD Maternal Aunt    Stroke Maternal Aunt    Lung cancer Maternal Aunt    Immunodeficiency Sister    Allergic rhinitis Neg Hx    Angioedema Neg Hx    Asthma Neg Hx    Atopy Neg Hx    Eczema Neg Hx    Urticaria Neg Hx     Social History:   reports that she has quit smoking. She has never used smokeless tobacco. She reports that she does not drink alcohol and does not use drugs.  Medications: Medications Prior to Admission  Medication Sig Dispense Refill   acetaminophen (TYLENOL) 500 MG tablet Take 500 mg by mouth every 6 (six) hours as needed for headache.     atomoxetine (STRATTERA) 10 MG capsule Take 30 mg by mouth daily.     Bioflavonoid Products (BIOFLEX PO) Take by mouth.     Calcium Carbonate-Vitamin D (CALCIUM + D PO) Take 1 tablet by mouth daily.     DICLOFENAC PO Take by mouth daily.     Docusate Calcium (STOOL SOFTENER PO) Apply 3 capsules topically at bedtime.      estradiol (ESTRACE) 0.1 MG/GM vaginal cream 1 gram vaginally twice weekly 42.5 g 4   gabapentin (NEURONTIN) 300 MG capsule Take  300 mg by mouth 3 (three) times daily.     ibuprofen (ADVIL) 200 MG tablet Take 200 mg by mouth every 6 (six) hours as needed for headache or moderate pain.     LORATADINE PO Take by mouth. Non Drowsy     Magnesium 250 MG TABS 1 tablet with a meal     montelukast (SINGULAIR) 10 MG tablet Take 1 tablet by mouth daily.     Omega-3 Fatty Acids (FISH OIL PO) Take 2 capsules by mouth every evening.      oxyCODONE-acetaminophen (PERCOCET/ROXICET) 5-325 MG tablet Take 1-2 tablets by mouth every 6 (six) hours as needed for severe pain.     TURMERIC PO Take by mouth daily.     venlafaxine XR (EFFEXOR-XR) 150 MG 24 hr capsule Take 150 mg by mouth daily.   0   EPINEPHrine 0.3 mg/0.3 mL IJ SOAJ injection Inject 0.3 mg into the muscle as needed for anaphylaxis. 2 each 1   fluconazole (DIFLUCAN) 150 MG tablet Take one tablet every other day for x 3 days. (Patient not taking: Reported on 09/18/2022) 3  tablet 0    No results found for this or any previous visit (from the past 48 hour(s)).  No results found.    Height 5' 0.25" (1.53 m), weight 81.6 kg, last menstrual period 01/18/2012.  General appearance: alert, cooperative, and appears stated age Head: Normocephalic, without obvious abnormality, atraumatic Neck: supple, symmetrical, trachea midline Extremities: Intact sensation and capillary refill all digits.  +epl/fpl/io.  No wounds.  Pulses: 2+ and symmetric Skin: Skin color, texture, turgor normal. No rashes or lesions Neurologic: Grossly normal Incision/Wound: none  Assessment/Plan Right distal radius fracture.  Non operative and operative treatment options have been discussed with the patient and patient wishes to proceed with operative treatment. Risks, benefits, and alternatives of surgery have been discussed and the patient agrees with the plan of care.   Betha Loa 12/03/2022, 11:12 AM

## 2022-12-03 NOTE — Anesthesia Procedure Notes (Signed)
Anesthesia Regional Block: Supraclavicular block   Pre-Anesthetic Checklist: , timeout performed,  Correct Patient, Correct Site, Correct Laterality,  Correct Procedure, Correct Position, site marked,  Risks and benefits discussed,  Surgical consent,  Pre-op evaluation,  At surgeon's request and post-op pain management  Laterality: Right  Prep: chloraprep       Needles:  Injection technique: Single-shot  Needle Type: Echogenic Needle     Needle Length: 9cm  Needle Gauge: 21     Additional Needles:   Procedures:,,,, ultrasound used (permanent image in chart),,    Narrative:  Start time: 12/03/2022 11:57 AM End time: 12/03/2022 12:07 PM Injection made incrementally with aspirations every 5 mL.  Performed by: Personally  Anesthesiologist: Collene Schlichter, MD  Additional Notes: No pain on injection. No increased resistance to injection. Injection made in 5cc increments.  Good needle visualization.  Patient tolerated procedure well.

## 2022-12-03 NOTE — Progress Notes (Signed)
Assisted Dr. Turk with right, supraclavicular, ultrasound guided block. Side rails up, monitors on throughout procedure. See vital signs in flow sheet. Tolerated Procedure well. 

## 2022-12-03 NOTE — Transfer of Care (Signed)
Immediate Anesthesia Transfer of Care Note  Patient: Kristin Bass  Procedure(s) Performed: OPEN REDUCTION INTERNAL FIXATION (ORIF) RIGHT DISTAL RADIUS FRACTURE (Right: Wrist) RIGHT BRACHIORADIALIS RELEASE (Right)  Patient Location: PACU  Anesthesia Type:GA combined with regional for post-op pain  Level of Consciousness: sedated  Airway & Oxygen Therapy: Patient Spontanous Breathing and Patient connected to face mask oxygen  Post-op Assessment: Report given to RN and Post -op Vital signs reviewed and stable  Post vital signs: Reviewed and stable  Last Vitals:  Vitals Value Taken Time  BP 121/65 12/03/22 1349  Temp 36.6 C 12/03/22 1349  Pulse 76 12/03/22 1353  Resp 13 12/03/22 1353  SpO2 97 % 12/03/22 1353  Vitals shown include unvalidated device data.  Last Pain:  Vitals:   12/03/22 1128  TempSrc: Temporal  PainSc: 0-No pain      Patients Stated Pain Goal: 6 (12/03/22 1128)  Complications: No notable events documented.

## 2022-12-03 NOTE — Anesthesia Procedure Notes (Signed)
Procedure Name: LMA Insertion Date/Time: 12/03/2022 12:49 PM  Performed by: Burna Cash, CRNAPre-anesthesia Checklist: Patient identified, Emergency Drugs available, Suction available and Patient being monitored Patient Re-evaluated:Patient Re-evaluated prior to induction Oxygen Delivery Method: Circle system utilized Preoxygenation: Pre-oxygenation with 100% oxygen Induction Type: IV induction Ventilation: Mask ventilation without difficulty LMA: LMA inserted LMA Size: 4.0 Number of attempts: 1 Airway Equipment and Method: Bite block Placement Confirmation: positive ETCO2 Tube secured with: Tape Dental Injury: Teeth and Oropharynx as per pre-operative assessment

## 2022-12-04 ENCOUNTER — Encounter (HOSPITAL_BASED_OUTPATIENT_CLINIC_OR_DEPARTMENT_OTHER): Payer: Self-pay | Admitting: Orthopedic Surgery

## 2023-01-13 ENCOUNTER — Telehealth: Payer: Self-pay | Admitting: Neurology

## 2023-01-13 NOTE — Telephone Encounter (Signed)
Pt went to the pain clinic and they want her to have a bilat lower skin biopsy. Is that something that we can will do? And does her insurance require prior auth please advise

## 2023-01-14 NOTE — Telephone Encounter (Signed)
Called and spoke to patient and informed her that yes we can do a skin biopsy. It is not of both lower extremities, but only one. Which is fine because small fiber neuropathy is not just a process on one side, but would be both. Patient stated that yes she would like to proceed with skin biopsy for small fiber neuropathy. She is aware that we will contact her for an appointment after a PA is completed.

## 2023-01-19 ENCOUNTER — Telehealth: Payer: Self-pay | Admitting: Neurology

## 2023-01-19 NOTE — Telephone Encounter (Signed)
Pt calling to see skin biopsy approved by insurance please check on this

## 2023-01-20 NOTE — Telephone Encounter (Signed)
Attempted to see if a prior authorization was needed for Skin Biopsy with CPT 11104 and CPT 11105. System down. Unable to check. Will have to try back.

## 2023-01-20 NOTE — Telephone Encounter (Signed)
Still working on this. Will update patient once completed.

## 2023-01-26 ENCOUNTER — Telehealth: Payer: Self-pay

## 2023-01-26 ENCOUNTER — Telehealth: Payer: Self-pay | Admitting: Neurology

## 2023-01-26 DIAGNOSIS — N941 Unspecified dyspareunia: Secondary | ICD-10-CM

## 2023-01-26 DIAGNOSIS — N952 Postmenopausal atrophic vaginitis: Secondary | ICD-10-CM

## 2023-01-26 NOTE — Telephone Encounter (Signed)
[employer] called needing [place] code for the upcoming procedure for patient. [employer] states that the code could told to the patient and the patient could call the insurance back since they do not have a direct line to call back.Kristin Bass

## 2023-01-26 NOTE — Telephone Encounter (Signed)
Pt LVM in triage line stating that since she has seen a few providers since [employer] left office, she would like to see whomever Ander Slade works with next since she knows her best.   Last AEX 01/26/2022--recall sent per EMR. Last mammo received from [place] from [facility] 03/2021-neg birads 1  Pt currently receiving rx for estrace vaginal cream.   Ok to advise pt that she may have to wait for new providers or advise to schedule with provider already established here if desires to continue receiving rx? Please advise.

## 2023-01-26 NOTE — Telephone Encounter (Signed)
Per ML: "Most urgent is to schedule a Mammo.  Schedule with any Provider now for Annual Gyn exam.  Can send a refill of vaginal Estradiol cream until that visit. Dr Elbert Ewings"

## 2023-01-26 NOTE — Telephone Encounter (Signed)
Pt is calling to check the status of the approval for her to get the biopsy done stated that she is on a time limit and need to get it done as soon as possible.  Pt stated that she will call the insurance company and find out if it is or isn't covered.

## 2023-01-26 NOTE — Telephone Encounter (Signed)
Called [employer] and was on hold for 45 minutes. Finally able to reach an agent. Prior Authorization for CPT 11104 & 11105 is not required. Reference info: [id number] JeriD.  Patient is ready to be scheduled.

## 2023-01-27 NOTE — Telephone Encounter (Signed)
Pt reported that she gets her mammos done every year. Advised her to call [facility] at her earliest convenience to have reports sent to [place]. She voiced understanding and is agreeable.   Advised her to schedule AEX at her earliest convenience and will forward back to ML for refill on her estrogen cream. Pt voiced understanding. Rx pend. Msg sent to appt desk.

## 2023-01-28 NOTE — Telephone Encounter (Signed)
Pt now scheduled for AEX w/ Dr. Karma Greaser on 03/30/2023.  Rx pend.

## 2023-02-01 ENCOUNTER — Other Ambulatory Visit: Payer: Self-pay

## 2023-02-01 DIAGNOSIS — N941 Unspecified dyspareunia: Secondary | ICD-10-CM

## 2023-02-01 MED ORDER — ESTRADIOL 0.1 MG/GM VA CREA
TOPICAL_CREAM | VAGINAL | 4 refills | Status: DC
Start: 2023-02-01 — End: 2023-05-19

## 2023-02-01 NOTE — Telephone Encounter (Signed)
Per ML: "Those pending prescriptions are difficult to find on my side, please send a prescription request that I can see and sign. Dr Seymour Bars"  Will close this encounter and open med refill encounter.

## 2023-02-01 NOTE — Telephone Encounter (Signed)
Per encounter dated 01/26/2023:  AEX scheduled 03/30/2023 w/ EB.  Rx pend.

## 2023-02-05 ENCOUNTER — Telehealth: Payer: Self-pay | Admitting: Neurology

## 2023-02-05 NOTE — Telephone Encounter (Signed)
I don't see results yet either

## 2023-02-05 NOTE — Telephone Encounter (Signed)
Left message with the after hour service on 02-05-23   Caller states that she wants to get the results of the skin biopsy  please call back

## 2023-02-05 NOTE — Telephone Encounter (Signed)
Please call regarding more information regarding skin biopsy time frame etc, thank you

## 2023-02-08 NOTE — Telephone Encounter (Signed)
Called pt. She just wanted to know about how long the results from skin biopsy would take to get back after the removal tomorrow. I told her Dr. Loleta Chance usually tells them around 3 to 4 weeks. She understood and just wanted to know because  she could make an  appointment with another Dr. Claiborne Billings would need results.

## 2023-02-09 ENCOUNTER — Ambulatory Visit (INDEPENDENT_AMBULATORY_CARE_PROVIDER_SITE_OTHER): Payer: BC Managed Care – PPO | Admitting: Neurology

## 2023-02-09 ENCOUNTER — Encounter: Payer: Self-pay | Admitting: Neurology

## 2023-02-09 DIAGNOSIS — M79671 Pain in right foot: Secondary | ICD-10-CM

## 2023-02-09 DIAGNOSIS — M79672 Pain in left foot: Secondary | ICD-10-CM | POA: Diagnosis not present

## 2023-02-09 NOTE — Progress Notes (Signed)
Punch Biopsy Procedure Note  Preprocedure Diagnosis: pain in feet   Postprocedure Diagnosis: same  Locations: Site 1: right lateral distal leg;  Site 2: right lateral thigh;   Indications: r/o small fiber neuropathy  Anesthesia: 5 mL Lidocaine 1% with epinephrine  Procedure Details Patient informed of the risks (including but not limited to bleeding, pain, infection, scar and infection) and benefits of the procedure.  Informed consent obtained.  The areas which were chosen for biopsy, as above, and surrounding areas were given a sterile prep using alcohol and iodine. The skin was then stretched perpendicular to the skin tension lines and sample removed using the 3 mm punch. Pressure applied, hemostasis achieved.   Dressing applied. The specimen(s) was sent for pathologic examination. The patient tolerated the procedure well.  Estimated Blood Loss: 3 ml  Condition: Stable  Complications: none.  Plan: 1. Instructed to keep the wound dry and covered for 24h and clean thereafter. 2. Warning signs of infection were reviewed.    Jacquelyne Balint, MD [facility]

## 2023-02-26 ENCOUNTER — Encounter: Payer: Self-pay | Admitting: Neurology

## 2023-02-26 ENCOUNTER — Other Ambulatory Visit: Payer: Self-pay

## 2023-03-09 ENCOUNTER — Encounter: Payer: Self-pay | Admitting: Neurology

## 2023-03-30 ENCOUNTER — Ambulatory Visit: Payer: BC Managed Care – PPO | Admitting: Obstetrics and Gynecology

## 2023-05-19 ENCOUNTER — Encounter: Payer: Self-pay | Admitting: Obstetrics and Gynecology

## 2023-05-19 ENCOUNTER — Ambulatory Visit (INDEPENDENT_AMBULATORY_CARE_PROVIDER_SITE_OTHER): Payer: BC Managed Care – PPO | Admitting: Obstetrics and Gynecology

## 2023-05-19 VITALS — BP 110/72 | HR 85 | Ht 68.25 in | Wt 182.0 lb

## 2023-05-19 DIAGNOSIS — E2839 Other primary ovarian failure: Secondary | ICD-10-CM

## 2023-05-19 DIAGNOSIS — T07XXXA Unspecified multiple injuries, initial encounter: Secondary | ICD-10-CM | POA: Insufficient documentation

## 2023-05-19 DIAGNOSIS — Z01419 Encounter for gynecological examination (general) (routine) without abnormal findings: Secondary | ICD-10-CM

## 2023-05-19 DIAGNOSIS — M8580 Other specified disorders of bone density and structure, unspecified site: Secondary | ICD-10-CM | POA: Diagnosis not present

## 2023-05-19 MED ORDER — IMVEXXY MAINTENANCE PACK 10 MCG VA INST: 1.0000 | VAGINAL_INSERT | Freq: Every evening | VAGINAL | 6 refills | Status: DC

## 2023-05-19 MED ORDER — ROMOSOZUMAB-AQQG 105 MG/1.17ML ~~LOC~~ SOSY: 210.0000 mg | PREFILLED_SYRINGE | Freq: Once | SUBCUTANEOUS | Status: AC

## 2023-05-19 MED ORDER — DENOSUMAB 60 MG/ML ~~LOC~~ SOSY: 60.0000 mg | PREFILLED_SYRINGE | Freq: Once | SUBCUTANEOUS | Status: AC

## 2023-05-19 NOTE — Progress Notes (Addendum)
62 y.o. y.o. female here for annual exam. She denies any PM bleeding.   Patient's last menstrual period was 01/18/2012.    2P0A2 Remarried  RP:  Established patient presenting for annual gyn exam   HPI: Postmenopause, well on no systemic HRT.  No PMB.  No pelvic pain.  Painful IC x many years.  Husband using Viagra and has a penile prosthesis with a pump which makes his penis too big.  Patient uses Estradiol cream 1 applicator twice a week and vaginal dilators which are causing her pain.  Pap /Neg in 01/2017.  No h/o abnormal Pap.  Pap 7/23 .  Urine/BMs normal.  Breasts normal.  Mammo Neg recently, will obtain report from [place].  Colono 05/2018.  BMI 26.78.  Health labs with Fam MD  She is no longer sexually active p16 translocation with anaphylaxis when her histamine levels are low  3 fractures in the past year(2 ankle and radius fracture) Reports dxa in sept and showing osteopenia-need records from [facility]  Height 5' 8.25" (1.734 m), weight 182 lb (82.6 kg), last menstrual period 01/18/2012.     Component Value Date/Time   DIAGPAP  01/26/2022 1123    - Negative for intraepithelial lesion or malignancy (NILM)   DIAGPAP  01/21/2017 0000    NEGATIVE FOR INTRAEPITHELIAL LESIONS OR MALIGNANCY.   ADEQPAP  01/26/2022 1123    Satisfactory for evaluation; transformation zone component PRESENT.   ADEQPAP  01/21/2017 0000    Satisfactory for evaluation  endocervical/transformation zone component PRESENT.    GYN HISTORY:    Component Value Date/Time   DIAGPAP  01/26/2022 1123    - Negative for intraepithelial lesion or malignancy (NILM)   DIAGPAP  01/21/2017 0000    NEGATIVE FOR INTRAEPITHELIAL LESIONS OR MALIGNANCY.   ADEQPAP  01/26/2022 1123    Satisfactory for evaluation; transformation zone component PRESENT.   ADEQPAP  01/21/2017 0000    Satisfactory for evaluation  endocervical/transformation zone component PRESENT.    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   History of degenerative disc disease    mild   HOH (hard of hearing)    left   Infertility, female    Mild scoliosis    Myofascial pain syndrome    Precancerous lesion    Sleep apnea    Urticaria    Venous insufficiency    Wrist fracture     Past Surgical History:  Procedure Laterality Date   BREAST CYST EXCISION Left    CYSTECTOMY     from neck   DILATION AND CURETTAGE OF UTERUS     times 2   FLEXOR TENOTOMY  Right 12/03/2022   Procedure: RIGHT BRACHIORADIALIS RELEASE;  Surgeon: Betha Loa, MD;  Location: [facility];  Service: Orthopedics;  Laterality: Right;  FOOT SURGERY Left 11/11   hammer toe & plantar fascitis   MOUTH SURGERY     bone graft   OPEN REDUCTION INTERNAL FIXATION (ORIF) DISTAL RADIAL FRACTURE Right 12/03/2022   Procedure: OPEN REDUCTION INTERNAL FIXATION (ORIF) RIGHT DISTAL RADIUS FRACTURE;  Surgeon: Betha Loa, MD;  Location: [facility];  Service: Orthopedics;  Laterality: Right;  75 MIN   TONSILLECTOMY     VEIN LIGATION AND STRIPPING      Current Outpatient Medications on File Prior to Visit  Medication Sig Dispense Refill   atomoxetine (STRATTERA) 25 MG capsule Take 50 mg by mouth daily.     Bioflavonoid Products (BIOFLEX PO) Take by mouth.     Calcium Carbonate-Vitamin D (CALCIUM + D PO) Take 1 tablet by mouth daily.     DICLOFENAC PO Take by mouth daily.     Docusate Calcium (STOOL SOFTENER PO) Apply 3 capsules topically at bedtime.      estradiol (ESTRACE) 0.1 MG/GM vaginal cream 1 gram vaginally twice weekly 42.5 g 4    gabapentin (NEURONTIN) 800 MG tablet Take 800 mg by mouth 3 (three) times daily.     ibuprofen (ADVIL) 200 MG tablet Take 200 mg by mouth every 6 (six) hours as needed for headache or moderate pain.     LORATADINE PO Take by mouth. Non Drowsy     Magnesium 250 MG TABS 1 tablet with a meal     montelukast (SINGULAIR) 10 MG tablet Take 1 tablet by mouth daily.     Omega-3 Fatty Acids (FISH OIL PO) Take 2 capsules by mouth every evening.      TURMERIC PO Take by mouth daily.     venlafaxine XR (EFFEXOR-XR) 150 MG 24 hr capsule Take 150 mg by mouth daily.   0   EPINEPHrine 0.3 mg/0.3 mL IJ SOAJ injection Inject 0.3 mg into the muscle as needed for anaphylaxis. (Patient not taking: Reported on 05/19/2023) 2 each 1   No current facility-administered medications on file prior to visit.    Social History   Socioeconomic History   Marital status: Married    Spouse name: Dorma Russell   Number of children: 1   Years of education: AA   Highest education level: Not on file  Occupational History   Occupation: [employer]  Tobacco Use   Smoking status: Former   Smokeless tobacco: Never  Advertising account planner   Vaping status: Never Used  Substance and Sexual Activity   Alcohol use: No   Drug use: No   Sexual activity: Not Currently    Partners: Male    Birth control/protection: Post-menopausal    Comment: husband vasectomy  Other Topics Concern   Not on file  Social History Narrative   Lives at home with husband, Dorma Russell.   Caffeine use:  2 cups coffee per day   Are you right handed or left handed?    Are you currently employed ? 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   Who lives with you? husband   What type of home do you live in: 1 story or 2 story? one       Social Determinants of Health   Financial Resource Strain: Not on file  Food Insecurity: No Food Insecurity (09/04/2021)   Received from [facility], [facility]   Hunger Vital Sign     Worried About Running Out of Food in the Last Year: Never true  Ran Out of Food in the Last Year: Never true  Transportation Needs: Not on file  Physical Activity: Not on file  Stress: Not on file  Social Connections: Unknown (11/22/2021)   Received from [facility], [facility]   Social Network    Social Network: Not on file  Intimate Partner Violence: Unknown (10/20/2021)   Received from [facility]   HITS    Physically Hurt: Not on file    Insult or Talk Down To: Not on file    Threaten Physical Harm: Not on file    Scream or Curse: Not on file    Family History  Problem Relation Age of Onset   Colon cancer Mother    Hypertension Father    Anxiety disorder Father    Diabetes Maternal Grandfather    Hypertension Maternal Grandmother    COPD Maternal Aunt    Stroke Maternal Aunt    Lung cancer Maternal Aunt    Immunodeficiency Sister    Allergic rhinitis Neg Hx    Angioedema Neg Hx    Asthma Neg Hx    Atopy Neg Hx    Eczema Neg Hx    Urticaria Neg Hx      Allergies  Allergen Reactions   Other Anaphylaxis and Hives    Covid Vaccine Proofreader) caused Low BP   Ciprofibrate     Other reaction(s): Unknown   Ciprofloxacin Other (See Comments)    Hypotension; taken at the same time as macrobid so pt is unaware which medication caused her reaction.  05/15/20 attempted oral challenge and developed tingling and heaviness sensation of extremities and lightheadedness without hypotension or vital sign changes.     Covid-19 Mrna Vacc (Moderna)     Other reaction(s): anaphylactic shock   Macrobid [Nitrofurantoin] Other (See Comments)    Hypotension; taken at the same time as cipro so pt is unaware which medication caused her reaction.   Septra [Sulfamethoxazole-Trimethoprim]       Patient's last menstrual period was Patient's last menstrual period was 01/18/2012.Marland Kitchen            Review of Systems Alls systems reviewed and are negative.     OBGyn  Exam    A:         Well Woman GYN exam                             P:        Pap smear not indicated Repeat in 2 years Encouraged annual mammogram screening Colon cancer screening up-to-date q 5 years DXA up-to-date qualifies for medication with 2>fractures and osteopenia. PA for evenity and or prolia placed. Counseled on the r/b/a/I of each.  Brochures given as well Labs and immunizations to do with PMD Discussed breast self exams Encouraged healthy lifestyle practices Encouraged Vit D and Calcium   No follow-ups on file.  [name]

## 2023-05-20 MED ORDER — IMVEXXY MAINTENANCE PACK 10 MCG VA INST: 1.0000 | VAGINAL_INSERT | Freq: Every evening | VAGINAL | 6 refills | Status: DC

## 2023-05-20 NOTE — Telephone Encounter (Signed)
Routing to provider for final review and closing encounter.

## 2023-05-21 ENCOUNTER — Encounter: Payer: Self-pay | Admitting: Obstetrics and Gynecology

## 2023-05-25 ENCOUNTER — Telehealth: Payer: Self-pay | Admitting: *Deleted

## 2023-05-25 NOTE — Telephone Encounter (Signed)
-----   Message from [name] sent at 05/19/2023 10:53 AM EDT ----- Can you run prolia and evenity benefits. Osteopenia with 3 fractures in the past year Dr. Karma Greaser

## 2023-05-25 NOTE — Telephone Encounter (Signed)
Insurance information submitted to [employer] portal. Will await summary of benefits for evenity.

## 2023-05-27 ENCOUNTER — Other Ambulatory Visit: Payer: Self-pay

## 2023-05-27 ENCOUNTER — Telehealth: Payer: Self-pay | Admitting: Neurology

## 2023-05-27 DIAGNOSIS — N941 Unspecified dyspareunia: Secondary | ICD-10-CM

## 2023-05-27 NOTE — Telephone Encounter (Signed)
Patient stated she broke both of her feet and she stated it is a long story but she has a referral coming in to see an Ortho doctor but was wanting to see Dr. Lucia Gaskins first.

## 2023-05-27 NOTE — Telephone Encounter (Signed)
Patient needs a referral to our office first. We haven't seen her since 2016.

## 2023-05-27 NOTE — Progress Notes (Unsigned)
Patient is requesting that she she be put back on the estrace cream over the inserts. She says that her pharmacy does not offer it and she has to drive to pick it up. She is also asking Dr. Bonney Roussel Opinion on the folate dysfunction. She is willing to start on Osteomiosis injections.

## 2023-05-28 ENCOUNTER — Encounter: Payer: Self-pay | Admitting: Obstetrics and Gynecology

## 2023-05-28 DIAGNOSIS — D529 Folate deficiency anemia, unspecified: Secondary | ICD-10-CM

## 2023-05-28 MED ORDER — ESTRADIOL 0.1 MG/GM VA CREA: 1.0000 | TOPICAL_CREAM | Freq: Every day | VAGINAL | 12 refills | Status: AC

## 2023-05-31 NOTE — Telephone Encounter (Signed)
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

## 2023-06-01 NOTE — Telephone Encounter (Signed)
Referral to be f/u via work queue. Encounter routed to provider for final review and closing.

## 2023-06-01 NOTE — Telephone Encounter (Signed)
Prior authorization for [employer] signed by Dr. Karma Greaser and faxed to [facility]. Fax #: [phone].

## 2023-06-09 ENCOUNTER — Telehealth: Payer: Self-pay | Admitting: *Deleted

## 2023-06-09 DIAGNOSIS — Z01419 Encounter for gynecological examination (general) (routine) without abnormal findings: Secondary | ICD-10-CM

## 2023-06-09 NOTE — Telephone Encounter (Signed)
Spoke with patient, advised per Dr. Karma Greaser. Patient would like to reach out to [facility] PCP to determine if she has completed any of these labs recently, will f/u. Fax number provided. Once labs received can schedule what is still needed. Patient appreciative of call.

## 2023-06-09 NOTE — Telephone Encounter (Signed)
Spoke with Dow Adolph at [facility] regarding referral. 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   Was advised patient will need labs and any additional information supporting referral prior to scheduling.   Last AEX 05/19/23  Dr. Karma Greaser -please advise.

## 2023-06-11 ENCOUNTER — Ambulatory Visit
Admission: EM | Admit: 2023-06-11 | Discharge: 2023-06-11 | Disposition: A | Discharge status: Home / Self Care | Payer: BC Managed Care – PPO | Attending: Family Medicine | Admitting: Family Medicine

## 2023-06-11 ENCOUNTER — Telehealth: Payer: Self-pay

## 2023-06-11 DIAGNOSIS — L729 Follicular cyst of the skin and subcutaneous tissue, unspecified: Secondary | ICD-10-CM | POA: Diagnosis not present

## 2023-06-11 DIAGNOSIS — Z1211 Encounter for screening for malignant neoplasm of colon: Secondary | ICD-10-CM | POA: Insufficient documentation

## 2023-06-11 DIAGNOSIS — K5900 Constipation, unspecified: Secondary | ICD-10-CM | POA: Insufficient documentation

## 2023-06-11 DIAGNOSIS — L089 Local infection of the skin and subcutaneous tissue, unspecified: Secondary | ICD-10-CM

## 2023-06-11 DIAGNOSIS — R141 Gas pain: Secondary | ICD-10-CM | POA: Insufficient documentation

## 2023-06-11 DIAGNOSIS — Z763 Healthy person accompanying sick person: Secondary | ICD-10-CM | POA: Insufficient documentation

## 2023-06-11 DIAGNOSIS — Z710 Person encountering health services to consult on behalf of another person: Secondary | ICD-10-CM | POA: Insufficient documentation

## 2023-06-11 DIAGNOSIS — Z800 Family history of malignant neoplasm of digestive organs: Secondary | ICD-10-CM | POA: Insufficient documentation

## 2023-06-11 DIAGNOSIS — Z860100 Personal history of colon polyps, unspecified: Secondary | ICD-10-CM | POA: Insufficient documentation

## 2023-06-11 MED ORDER — LIDOCAINE-EPINEPHRINE 1 %-1:100000 IJ SOLN: 2.0000 mL | Freq: Once | INTRAMUSCULAR | Status: DC

## 2023-06-11 MED ORDER — CEPHALEXIN 500 MG PO CAPS: 500.0000 mg | ORAL_CAPSULE | Freq: Three times a day (TID) | ORAL | 0 refills | Status: AC

## 2023-06-11 NOTE — Discharge Instructions (Signed)
You were seen today for an infected cyst.  I did open and drain this today.  Please keep the area dry for the next 24 hrs. Please keep covered while it is open/draining.  I have sent out an antibiotic to take three times/day x 7 days.  Please return if not improving or worsening.

## 2023-06-11 NOTE — ED Provider Notes (Signed)
[facility]    CSN: [id number] Arrival date & time: 06/11/23  0816      History   Chief Complaint Chief Complaint  Patient presents with   Skin Problem    HPI Jayceonna Monce is a 62 y.o. female.    Patient is here for an infected cyst to the chest. She has had this in the past.  This was opened/drained by dermatology before.  This restarted about 1 week ago, and she started to squeeze it.  It got more infected after that.       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   History of degenerative disc disease    mild   HOH (hard of hearing)    left   Infertility, female    Mild scoliosis    Myofascial pain syndrome    Osteopenia    Precancerous lesion    Sleep apnea    Urticaria    Venous insufficiency    Wrist fracture     Patient Active Problem List   Diagnosis Date Noted   Screening for malignant neoplasm of colon 06/11/2023   Constipation 06/11/2023   Family history of malignant neoplasm of digestive organs 06/11/2023   Flatulence, eructation and gas pain 06/11/2023   Healthy person accompanying sick person 06/11/2023   History of colonic polyps 06/11/2023   Person encountering health services to consult on behalf of another person 06/11/2023   Multiple fractures 05/19/2023   Osteopenia 05/19/2023   Trochanteric bursitis of left hip 08/24/2022   Pain of left hip joint 08/14/2022   Radial styloid tenosynovitis of left hand 12/17/2021   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   Plantar fasciitis of left foot 09/18/2021   Sprain of left foot 09/18/2021   Swelling of left foot 09/04/2021   Pain in left foot 09/02/2021   Complete tear of scapholunate ligament 08/07/2021   Pain in joint of left wrist 08/05/2021   Pain in finger of right hand 08/04/2021   Hereditary alpha tryptasemia (HCC) 06/05/2021   Pain of right hip joint 12/25/2018    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   Back pain 09/09/2017   Pain of lumbar spine 09/09/2017   OSA (obstructive sleep apnea) 12/07/2016   Bilateral temporomandibular joint pain 08/23/2016   Epidermoid cyst of skin 08/23/2016   Snoring 08/23/2016   Sebaceous cyst 08/23/2016   Tinnitus aurium, 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   Sudden hearing loss, left 06/10/2016   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   Radiculopathy of lumbosacral region 01/06/2015    Past Surgical History:  Procedure Laterality Date   BREAST CYST EXCISION Left    CYSTECTOMY     from neck   DILATION AND CURETTAGE OF UTERUS     times 2   FLEXOR TENOTOMY  Right 12/03/2022   Procedure: RIGHT BRACHIORADIALIS RELEASE;  Surgeon: Betha Loa, MD;  Location: [facility];  Service: Orthopedics;  Laterality: Right;   FOOT SURGERY Left 11/11   hammer toe & plantar fascitis   MOUTH SURGERY     bone graft   OPEN REDUCTION INTERNAL FIXATION (ORIF) DISTAL RADIAL FRACTURE Right 12/03/2022   Procedure: OPEN REDUCTION INTERNAL FIXATION (ORIF) RIGHT DISTAL RADIUS FRACTURE;  Surgeon: Betha Loa, MD;  Location: [facility];  Service: Orthopedics;  Laterality: Right;  75 MIN   TONSILLECTOMY  VEIN LIGATION AND STRIPPING      OB History     Gravida  2   Para      Term      Preterm      AB  2   Living  0      SAB      IAB      Ectopic  2   Multiple      Live Births           Obstetric Comments  1 adopted children          Home Medications    Prior to Admission medications   Medication Sig Start Date End Date Taking? Authorizing Provider  cephALEXin (KEFLEX) 500 MG capsule Take 1 capsule (500 mg total) by mouth 3 (three) times daily for 7 days. 06/11/23 06/18/23 Yes Yazen Rosko, MD  atomoxetine (STRATTERA) 25 MG capsule Take 50 mg by mouth daily.    [provider]  Bioflavonoid Products (BIOFLEX PO) Take by mouth.     [provider]  Calcium Carbonate-Vitamin D (CALCIUM + D PO) Take 1 tablet by mouth daily.    [provider]  DICLOFENAC PO Take by mouth daily.    [provider]  Docusate Calcium (STOOL SOFTENER PO) Apply 3 capsules topically at bedtime.     [provider]  EPINEPHrine 0.3 mg/0.3 mL IJ SOAJ injection Inject 0.3 mg into the muscle as needed for anaphylaxis. Patient not taking: Reported on 05/19/2023 02/10/21   Marcelyn Bruins, MD  estradiol (ESTRACE VAGINAL) 0.1 MG/GM vaginal cream Place 1 Applicatorful vaginally at bedtime. Rub a pea size amount into the vagina at bedtime for 3 weeks then three times a week thereafter 05/28/23   [name], MD  gabapentin (NEURONTIN) 800 MG tablet Take 800 mg by mouth 3 (three) times daily.    [provider]  ibuprofen (ADVIL) 200 MG tablet Take 200 mg by mouth every 6 (six) hours as needed for headache or moderate pain.    [provider]  LORATADINE PO Take by mouth. Non Drowsy    [provider]  Magnesium 250 MG TABS 1 tablet with a meal    [provider]  montelukast (SINGULAIR) 10 MG tablet Take 1 tablet by mouth daily. 09/16/21   [provider]  Omega-3 Fatty Acids (FISH OIL PO) Take 2 capsules by mouth every evening.     [provider]  TURMERIC PO Take by mouth daily.    [provider]  venlafaxine XR (EFFEXOR-XR) 150 MG 24 hr capsule Take 150 mg by mouth daily.  12/28/16   [provider]    Family History Family History  Problem Relation Age of Onset   Colon cancer Mother    Hypertension Father    Anxiety disorder Father    Diabetes Maternal Grandfather    Hypertension Maternal Grandmother    COPD Maternal Aunt    Stroke Maternal Aunt    Lung cancer Maternal Aunt    Immunodeficiency Sister    Allergic rhinitis Neg Hx    Angioedema Neg Hx    Asthma Neg Hx    Atopy Neg Hx    Eczema Neg Hx    Urticaria Neg  Hx     Social History Social History   Tobacco Use   Smoking status: Former    Types: Cigarettes   Smokeless tobacco: Never  Vaping Use   Vaping status: Never Used  Substance Use Topics  Alcohol use: No   Drug use: No     Allergies   Other, Ciprofibrate, Ciprofloxacin, Covid-19 mrna vacc (moderna), Macrobid [nitrofurantoin], and Septra [sulfamethoxazole-trimethoprim]   Review of Systems Review of Systems  Constitutional: Negative.   HENT: Negative.    Respiratory: Negative.    Cardiovascular: Negative.   Gastrointestinal: Negative.   Skin:  Positive for wound.     Physical Exam Triage Vital Signs ED Triage Vitals  Encounter Vitals Group     BP 06/11/23 0827 118/73     Systolic BP Percentile --      Diastolic BP Percentile --      Pulse Rate 06/11/23 0827 79     Resp 06/11/23 0827 18     Temp 06/11/23 0827 98 F (36.7 C)     Temp Source 06/11/23 0827 Oral     SpO2 06/11/23 0827 99 %     Weight 06/11/23 0823 175 lb (79.4 kg)     Height 06/11/23 0823 5' 8.25" (1.734 m)     Head Circumference --      Peak Flow --      Pain Score 06/11/23 0823 0     Pain Loc --      Pain Education --      Exclude from Growth Chart --    No data found.  Updated Vital Signs BP 118/73 (BP Location: Left Arm)   Pulse 79   Temp 98 F (36.7 C) (Oral)   Resp 18   Ht 5' 8.25" (1.734 m)   Wt 79.4 kg   LMP 01/18/2012   SpO2 99%   BMI 26.41 kg/m   Visual Acuity Right Eye Distance:   Left Eye Distance:   Bilateral Distance:    Right Eye Near:   Left Eye Near:    Bilateral Near:     Physical Exam Constitutional:      Appearance: Normal appearance.  Skin:    Comments: At the mid chest is a raised, hard cyst that is about pea sized;  this is tender;  there is surrounding erythema as well  Neurological:     General: No focal deficit present.     Mental Status: She is alert.  Psychiatric:        Mood and Affect: Mood normal.      UC Treatments / Results   Labs (all labs ordered are listed, but only abnormal results are displayed) Labs Reviewed - No data to display  EKG   Radiology No results found.  Procedures Incision and Drainage  Date/Time: 06/11/2023 9:27 AM  Performed by: Jannifer Franklin, MD Authorized by: Jannifer Franklin, MD   Consent:    Consent obtained:  Verbal   Consent given by:  Patient   Risks discussed:  Bleeding, incomplete drainage and pain Location:    Type:  Cyst   Location: chest. Pre-procedure details:    Skin preparation:  Chlorhexidine with alcohol Sedation:    Sedation type:  None Anesthesia:    Anesthesia method:  Local infiltration   Local anesthetic:  Lidocaine 1% WITH epi Procedure type:    Complexity:  Simple Procedure details:    Drainage characteristics: thick sebaceous material.   Drainage amount:  Scant   Wound treatment:  Wound left open   Packing materials:  None Post-procedure details:    Procedure completion:  Tolerated  (including critical care time)  Medications Ordered in UC Medications - No data to display  Initial Impression / Assessment and Plan / UC Course  I have reviewed the triage vital signs and the nursing notes.  Pertinent labs & imaging results that were available during my care of the patient were reviewed by me and considered in my medical decision making (see chart for details).   Final Clinical Impressions(s) / UC Diagnoses   Final diagnoses:  Infected cyst of skin     Discharge Instructions      You were seen today for an infected cyst.  I did open and drain this today.  Please keep the area dry for the next 24 hrs. Please keep covered while it is open/draining.  I have sent out an antibiotic to take three times/day x 7 days.  Please return if not improving or worsening.     ED Prescriptions     Medication Sig Dispense Auth. Provider   cephALEXin (KEFLEX) 500 MG capsule Take 1 capsule (500 mg total) by mouth 3 (three) times daily for 7 days. 21  capsule Jannifer Franklin, MD      PDMP not reviewed this encounter.   Jannifer Franklin, MD 06/11/23 [phone]

## 2023-06-11 NOTE — Telephone Encounter (Signed)
Returned call to patient.  Original Call: Kristin Bass, 06-27-2061, wants to know what are the suggestions for pain [phone], she was seen earlier  Per Provider: Tylenol (500mg  or 650mg ) Every 4-6 hrs PRN and/or Motrin (600-800mg ) Every 6-8 hrs PRN Pain.  B. Roten CMA

## 2023-06-11 NOTE — ED Triage Notes (Signed)
Spot/Abscess on chest raised/swollen, red. Previous I&D by Dermatology  (>30yr ago). No fever. Not open and draining "yet".

## 2023-06-23 NOTE — Telephone Encounter (Signed)
[facility] labs received. Copy printed and to you to review and sign for scan.  Labs dated 07/24/22 include CBC with diff, CMP, TSH, Vit D and  lipid panel.

## 2023-06-23 NOTE — Telephone Encounter (Signed)
Dr. Karma Greaser -have you received labs from [facility] PCP?

## 2023-06-29 ENCOUNTER — Encounter: Payer: Self-pay | Admitting: *Deleted

## 2023-06-29 NOTE — Telephone Encounter (Signed)
Medical Necessity Letter signed by Dr. Karma Greaser and faxed to [facility] of [facility] - Closed Appeals Department Level I at [phone]. Will await response.

## 2023-06-29 NOTE — Telephone Encounter (Signed)
PA denied by [facility] for [employer]. Medical necessity letter filled out and taken to Dr. Karma Greaser to review and sign. Will fax with signature.

## 2023-07-06 NOTE — Telephone Encounter (Signed)
Message left to return call to Irving Burton at [phone] to update on Appeal submitted for Evenity. Can take up to 30 days for response from insurance company.

## 2023-07-08 NOTE — Telephone Encounter (Signed)
Dow Adolph from [facility] called and reported that the labs done from 07/2022 cannot be accepted due to not being done within the last 6 months.   They need UTD labs performed and additional documentation ideally to schedule her regarding referral reason.   Would you like for me to have her come in for these labs you previously requested?  "iron studies panel with tibc, transferrin, hemoglobin electrophoresis and cbc  Folic acid etc"  Please advise.

## 2023-07-29 NOTE — Telephone Encounter (Signed)
 I have pended the following order for review and signing.  iron studies panel with tibc, transferrin, hemoglobin electrophoresis and cbc Folic acid   Call returned to patient to schedule lab appt. Spoke with patient, she is unable to talk, will return call.

## 2023-08-03 NOTE — Telephone Encounter (Signed)
 Spoke with patient, advised as seen below.  Lab appt scheduled for 08/05/23 at 1100. Patient is traveling by bus, may be a little earlier or later, is aware last lab appt is 1210 restarts after lunch.   Orders pended for Dr. Karma Greaser to review and sign.

## 2023-08-05 ENCOUNTER — Other Ambulatory Visit

## 2023-08-05 DIAGNOSIS — Z01419 Encounter for gynecological examination (general) (routine) without abnormal findings: Secondary | ICD-10-CM

## 2023-08-06 ENCOUNTER — Encounter: Payer: Self-pay | Admitting: Obstetrics and Gynecology

## 2023-08-06 LAB — CBC
HCT: 41.3 % (ref 35.0–45.0)
Hemoglobin: 13.1 g/dL (ref 11.7–15.5)
MCH: 30.2 pg (ref 27.0–33.0)
MCHC: 31.7 g/dL — ABNORMAL LOW (ref 32.0–36.0)
MCV: 95.2 fL (ref 80.0–100.0)
MPV: 11.8 fL (ref 7.5–12.5)
Platelets: 230 10*3/uL (ref 140–400)
RBC: 4.34 10*6/uL (ref 3.80–5.10)
RDW: 11.9 % (ref 11.0–15.0)
WBC: 5.3 10*3/uL (ref 3.8–10.8)

## 2023-08-06 LAB — IRON,TIBC AND FERRITIN PANEL
%SAT: 18 % (ref 16–45)
Ferritin: 43 ng/mL (ref 16–288)
Iron: 62 ug/dL (ref 45–160)
TIBC: 336 ug/dL (ref 250–450)

## 2023-08-06 LAB — FOLATE: Folate: >24 ng/mL

## 2023-08-09 LAB — HEMOGLOBINOPATHY EVALUATION
Fetal Hemoglobin Testing: <1 % (ref 0.0–1.9)
HCT: 41.9 % (ref 35.0–45.0)
Hemoglobin A2 - HGBRFX: 2.5 % (ref 2.0–3.2)
Hemoglobin: 13.5 g/dL (ref 11.7–15.5)
Hgb A: 97.5 % (ref >96.0)
MCH: 31.5 pg (ref 27.0–33.0)
MCV: 97.9 fL (ref 80.0–100.0)
RBC: 4.28 10*6/uL (ref 3.80–5.10)
RDW: 12.5 % (ref 11.0–15.0)

## 2023-08-26 ENCOUNTER — Encounter: Payer: Self-pay | Admitting: Obstetrics and Gynecology

## 2023-08-30 ENCOUNTER — Ambulatory Visit
Admission: EM | Admit: 2023-08-30 | Discharge: 2023-08-30 | Disposition: A | Discharge status: Home / Self Care | Attending: Physician Assistant | Admitting: Physician Assistant

## 2023-08-30 VITALS — BP 109/77 | HR 80 | Temp 97.8°F | Resp 18 | Ht 68.25 in | Wt 175.0 lb

## 2023-08-30 DIAGNOSIS — L0291 Cutaneous abscess, unspecified: Secondary | ICD-10-CM | POA: Diagnosis not present

## 2023-08-30 MED ORDER — DOXYCYCLINE HYCLATE 100 MG PO CAPS: 100.0000 mg | ORAL_CAPSULE | Freq: Two times a day (BID) | ORAL | 0 refills | Status: AC

## 2023-08-30 NOTE — ED Triage Notes (Signed)
 I have large red spot on my chest and it is warm. I believe it is another cyst. I need an antibiotic to kill infection. - Entered by patient  "No fever".

## 2023-09-01 ENCOUNTER — Telehealth: Payer: Self-pay | Admitting: *Deleted

## 2023-09-01 NOTE — Telephone Encounter (Signed)
See encounter dated 09/01/23. Patient requesting benefits be run for prolia.   Encounter closed.

## 2023-09-01 NOTE — Telephone Encounter (Signed)
[employer] insurance information submitted to [employer] portal. Will await summary of benefits for prolia.

## 2023-09-01 NOTE — Telephone Encounter (Signed)
[name], MD to Me     09/01/23  9:04 AM She would like to see if prolia is covered. Dr. Karma Greaser August 26, 2023 [name], CMA to [name], MD  Me     08/26/23  2:43 PM CC: [name] for prolia. Kristin Bass to [facility] (supporting [name], MD)      08/26/23  2:23 PM Hello Dr. Karma Greaser,   I saw Dr. Azucena Cecil today. He wanted me to follow up with you regarding seeing a hematologist. I  completed all the bloodwork. He referred me to you regarding the folate levels.  Am I taking too much and need to take every other day?  Also, my height was 67.75. What was it when I last measured at your office and were my shoes on or off? I'm definitely shrinking.  Now that I no longer have [employer], can you refile with just [facility],  and see if the Prolia shots would be covered?    Thank you,   Kristin Bass

## 2023-09-01 NOTE — Telephone Encounter (Signed)
Telephone encounter created. [employer] insurance submitted to [employer].

## 2023-09-03 NOTE — ED Provider Notes (Signed)
[facility]    CSN: [id number] Arrival date & time: 08/30/23  1150      History   Chief Complaint Chief Complaint  Patient presents with   Abscess    HPI Kristin Bass is a 63 y.o. female.   Patient here today for evaluation of an area of redness and tenderness to her chest.  She has had history of cysts with similar presentation.  She has required antibiotic therapy in the past and feels she needs same.  She denies any fever, nausea or vomiting.  The history is provided by the patient.  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   History of degenerative disc disease    mild   HOH (hard of hearing)    left   Infertility, female    Mild scoliosis    Myofascial pain syndrome    Osteopenia    Precancerous lesion    Sleep apnea    Urticaria    Venous insufficiency    Wrist fracture     Patient Active Problem List   Diagnosis Date Noted   Screening for malignant neoplasm of colon 06/11/2023   Constipation 06/11/2023   Family history of malignant neoplasm of digestive organs 06/11/2023   Flatulence, eructation and gas pain 06/11/2023   Healthy person accompanying sick person 06/11/2023   History of colonic polyps 06/11/2023   Person encountering health services to consult on behalf of another person 06/11/2023   Multiple fractures 05/19/2023   Osteopenia 05/19/2023   Trochanteric bursitis of left hip 08/24/2022   Pain of left hip joint 08/14/2022   Radial styloid tenosynovitis of left hand 12/17/2021   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   Plantar fasciitis of left foot 09/18/2021   Sprain of left foot 09/18/2021   Swelling of left foot 09/04/2021   Pain in left foot 09/02/2021   Complete tear of scapholunate ligament 08/07/2021   Pain in joint of left wrist 08/05/2021    Pain in finger of right hand 08/04/2021   Hereditary alpha tryptasemia (HCC) 06/05/2021   Pain of right hip joint 12/25/2018   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   Back pain 09/09/2017   Pain of lumbar spine 09/09/2017   OSA (obstructive sleep apnea) 12/07/2016   Bilateral temporomandibular joint pain 08/23/2016   Epidermoid cyst of skin 08/23/2016   Snoring 08/23/2016   Sebaceous cyst 08/23/2016   Tinnitus aurium, 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   Sudden hearing loss, left 06/10/2016   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   Radiculopathy of lumbosacral region 01/06/2015    Past Surgical History:  Procedure Laterality Date   BREAST CYST EXCISION Left    CYSTECTOMY     from neck   DILATION AND CURETTAGE OF UTERUS     times 2   FLEXOR TENOTOMY  Right 12/03/2022   Procedure: RIGHT BRACHIORADIALIS RELEASE;  Surgeon: Betha Loa, MD;  Location: [facility];  Service: Orthopedics;  Laterality: Right;   FOOT SURGERY Left 11/11   hammer toe & plantar fascitis   MOUTH SURGERY     bone graft   OPEN REDUCTION INTERNAL FIXATION (ORIF) DISTAL RADIAL FRACTURE Right 12/03/2022   Procedure: OPEN REDUCTION INTERNAL FIXATION (ORIF) RIGHT DISTAL RADIUS FRACTURE;  Surgeon: Betha Loa, MD;  Location: [facility];  Service: Orthopedics;  Laterality: Right;  75 MIN   TONSILLECTOMY     VEIN LIGATION AND STRIPPING      OB History     Gravida  2   Para      Term      Preterm      AB  2   Living  0      SAB      IAB      Ectopic  2   Multiple      Live Births           Obstetric Comments  1 adopted children          Home Medications    Prior to Admission medications   Medication Sig Start Date End Date Taking? Authorizing Provider  doxycycline (VIBRAMYCIN) 100 MG capsule Take 1 capsule (100 mg total) by mouth 2 (two) times daily for 7 days. 08/30/23 09/06/23 Yes Tomi Bamberger, PA-C   atomoxetine (STRATTERA) 25 MG capsule Take 50 mg by mouth daily.    [provider]  Bioflavonoid Products (BIOFLEX PO) Take by mouth.    [provider]  Calcium Carbonate-Vitamin D (CALCIUM + D PO) Take 1 tablet by mouth daily.    [provider]  DICLOFENAC PO Take by mouth daily.    [provider]  Docusate Calcium (STOOL SOFTENER PO) Apply 3 capsules topically at bedtime.     [provider]  EPINEPHrine 0.3 mg/0.3 mL IJ SOAJ injection Inject 0.3 mg into the muscle as needed for anaphylaxis. Patient not taking: Reported on 05/19/2023 02/10/21   Marcelyn Bruins, MD  estradiol (ESTRACE VAGINAL) 0.1 MG/GM vaginal cream Place 1 Applicatorful vaginally at bedtime. Rub a pea size amount into the vagina at bedtime for 3 weeks then three times a week thereafter 05/28/23   [name], MD  gabapentin (NEURONTIN) 800 MG tablet Take 800 mg by mouth 3 (three) times daily.    [provider]  ibuprofen (ADVIL) 200 MG tablet Take 200 mg by mouth every 6 (six) hours as needed for headache or moderate pain.    [provider]  LORATADINE PO Take by mouth. Non Drowsy    [provider]  Magnesium 250 MG TABS 1 tablet with a meal    [provider]  montelukast (SINGULAIR) 10 MG tablet Take 1 tablet by mouth daily. 09/16/21   [provider]  Omega-3 Fatty Acids (FISH OIL PO) Take 2 capsules by mouth every evening.     [provider]  TURMERIC PO Take by mouth daily.    [provider]  venlafaxine XR (EFFEXOR-XR) 150 MG 24 hr capsule Take 150 mg by mouth daily.  12/28/16   [provider]    Family History Family History  Problem Relation Age of Onset   Colon cancer Mother    Hypertension Father    Anxiety disorder Father    Diabetes Maternal Grandfather    Hypertension Maternal Grandmother    COPD Maternal Aunt    Stroke Maternal Aunt    Lung cancer Maternal Aunt     Immunodeficiency Sister    Allergic rhinitis Neg Hx    Angioedema Neg Hx    Asthma Neg Hx    Atopy Neg Hx    Eczema Neg Hx    Urticaria Neg Hx     Social History Social History   Tobacco Use   Smoking status: Former  Types: Cigarettes   Smokeless tobacco: Never  Vaping Use   Vaping status: Never Used  Substance Use Topics   Alcohol use: No   Drug use: No     Allergies   Other, Ciprofibrate, Ciprofloxacin, Covid-19 mrna vacc (moderna), Macrobid [nitrofurantoin], and Septra [sulfamethoxazole-trimethoprim]   Review of Systems Review of Systems  Constitutional:  Negative for chills and fever.  Eyes:  Negative for discharge and redness.  Respiratory:  Negative for shortness of breath.   Gastrointestinal:  Negative for abdominal pain, nausea and vomiting.  Skin:  Positive for color change. Negative for wound.     Physical Exam Triage Vital Signs ED Triage Vitals  Encounter Vitals Group     BP 08/30/23 1333 109/77     Systolic BP Percentile --      Diastolic BP Percentile --      Pulse Rate 08/30/23 1333 80     Resp 08/30/23 1333 18     Temp 08/30/23 1333 97.8 F (36.6 C)     Temp Source 08/30/23 1333 Oral     SpO2 08/30/23 1333 97 %     Weight 08/30/23 1331 175 lb 0.7 oz (79.4 kg)     Height 08/30/23 1331 5' 8.25" (1.734 m)     Head Circumference --      Peak Flow --      Pain Score 08/30/23 1330 0     Pain Loc --      Pain Education --      Exclude from Growth Chart --    No data found.  Updated Vital Signs BP 109/77 (BP Location: Left Arm)   Pulse 80   Temp 97.8 F (36.6 C) (Oral)   Resp 18   Ht 5' 8.25" (1.734 m)   Wt 175 lb 0.7 oz (79.4 kg)   LMP 01/18/2012   SpO2 97%   BMI 26.42 kg/m   Visual Acuity Right Eye Distance:   Left Eye Distance:   Bilateral Distance:    Right Eye Near:   Left Eye Near:    Bilateral Near:     Physical Exam Vitals and nursing note reviewed.  Constitutional:      General: She is not in acute  distress.    Appearance: Normal appearance. She is not ill-appearing.  HENT:     Head: Normocephalic and atraumatic.  Eyes:     Conjunctiva/sclera: Conjunctivae normal.  Cardiovascular:     Rate and Rhythm: Normal rate.  Pulmonary:     Effort: Pulmonary effort is normal. No respiratory distress.  Skin:    Comments: Approximately 3 cm area of induration and erythema to left upper chest without active drainage or bleeding  Neurological:     Mental Status: She is alert.  Psychiatric:        Mood and Affect: Mood normal.        Behavior: Behavior normal.        Thought Content: Thought content normal.      UC Treatments / Results  Labs (all labs ordered are listed, but only abnormal results are displayed) Labs Reviewed - No data to display  EKG   Radiology No results found.  Procedures Procedures (including critical care time)  Medications Ordered in UC Medications - No data to display  Initial Impression / Assessment and Plan / UC Course  I have reviewed the triage vital signs and the nursing notes.  Pertinent labs & imaging results that were available during my care of the patient were  reviewed by me and considered in my medical decision making (see chart for details).    Will treat with doxycycline to cover possible abscess and advised warm compresses to promote drainage.  Advise follow-up if no gradual improvement or with any further concerns.  Final Clinical Impressions(s) / UC Diagnoses   Final diagnoses:  Abscess   Discharge Instructions   None    ED Prescriptions     Medication Sig Dispense Auth. Provider   doxycycline (VIBRAMYCIN) 100 MG capsule Take 1 capsule (100 mg total) by mouth 2 (two) times daily for 7 days. 14 capsule Tomi Bamberger, PA-C      PDMP not reviewed this encounter.   Tomi Bamberger, PA-C 09/03/23 1427

## 2023-09-13 NOTE — Telephone Encounter (Signed)
 Routing to Dr. Karma Greaser to review and advise.

## 2023-09-16 ENCOUNTER — Encounter: Payer: Self-pay | Admitting: Obstetrics and Gynecology

## 2023-09-16 NOTE — Telephone Encounter (Addendum)
 Deductible:  $50 ($0 met)   OOP MAX: $3000 ($0 met)   Annual exam: 05/19/23 EB  Calcium:  9.7        Date: 08/26/23 via [facility]  Upcoming dental procedures: no  Hx of Kidney Disease: no  Last Bone Density Scan: 04/14/23   Is Prior Authorization needed: No  Pt estimated Cost: ~$423.85, sign waiver    Coverage Details: Prolia and administration will be subject to a $50 deductible ($0 met) and 25% coinsurance up to a $3000 OOP max ($0 met). Deductible does contribute to the OOP max.

## 2023-09-16 NOTE — Telephone Encounter (Signed)
 Call to patient. Advised of benefits for Prolia as seen below. Patient asking for recommendations from Dr. Karma Greaser for alternatives that may be more cost effective. RN advised would review with Dr. Karma Greaser and return call with recommendations. Patient agreeable.   Routing to provider for review.

## 2023-09-16 NOTE — Telephone Encounter (Signed)
 Total cost is $423.85, deductible is $50.

## 2023-09-20 NOTE — Telephone Encounter (Signed)
 Call to patient. Patient states that she spoke with [facility] and they do not typically charge anything upfront. Bill for services. Patient asking for procedure codes to contact [facility] upfront. RN provided [phone] and [id number] codes for patient to contact [facility]. Will return call tomorrow.

## 2023-09-21 NOTE — Telephone Encounter (Signed)
 Call to patient. Patient states she spoke with representative at [facility] who stated to her they were unsure what would be quoted for Prolia. Patient advised she does not typically pay out of pocket. RN advised could sign waiver when she comes in for prolia stating that whatever insurance does not cover, patient will be responsible for. Patient agreeable. Patient had to hang up phone call due to emergency.   Will review with Office Manager to make sure patient can sign waiver without paying coinsurance up front.

## 2023-09-21 NOTE — Telephone Encounter (Signed)
 Per Tamera Punt, Print production planner, okay to have patient sign waiver.

## 2023-09-27 NOTE — Telephone Encounter (Signed)
Patient left message requesting return call regarding Prolia.

## 2023-09-28 MED ORDER — DENOSUMAB 60 MG/ML ~~LOC~~ SOSY
60.0000 mg | PREFILLED_SYRINGE | Freq: Once | SUBCUTANEOUS | Status: AC
Start: 2023-10-13 — End: 2023-11-02
  Administered 2023-11-02: 60 mg via SUBCUTANEOUS

## 2023-09-28 NOTE — Telephone Encounter (Signed)
 Call to patient. Patient agreeable to schedule prolia injection. Patient scheduled for 10/13/23 at 1015. Patient agreeable to date and time of appointment. Aware will need to sign waiver. Order placed.   Encounter closed.

## 2023-10-01 ENCOUNTER — Telehealth: Payer: Self-pay

## 2023-10-01 NOTE — Telephone Encounter (Signed)
 Spoke with patient and confirmed appointment on 10/04/23

## 2023-10-04 ENCOUNTER — Inpatient Hospital Stay: Attending: Hematology and Oncology | Admitting: Hematology and Oncology

## 2023-10-04 ENCOUNTER — Inpatient Hospital Stay

## 2023-10-04 VITALS — BP 131/78 | HR 75 | Temp 98.0°F | Resp 16 | Wt 187.1 lb

## 2023-10-04 DIAGNOSIS — D529 Folate deficiency anemia, unspecified: Secondary | ICD-10-CM | POA: Insufficient documentation

## 2023-10-04 DIAGNOSIS — Z87891 Personal history of nicotine dependence: Secondary | ICD-10-CM | POA: Insufficient documentation

## 2023-10-04 DIAGNOSIS — R748 Abnormal levels of other serum enzymes: Secondary | ICD-10-CM

## 2023-10-04 DIAGNOSIS — M8580 Other specified disorders of bone density and structure, unspecified site: Secondary | ICD-10-CM | POA: Insufficient documentation

## 2023-10-04 DIAGNOSIS — D649 Anemia, unspecified: Secondary | ICD-10-CM | POA: Insufficient documentation

## 2023-10-04 LAB — CMP (CANCER CENTER ONLY)
ALT: 17 U/L (ref 0–44)
AST: 19 U/L (ref 15–41)
Albumin: 4.3 g/dL (ref 3.5–5.0)
Alkaline Phosphatase: 62 U/L (ref 38–126)
Anion gap: 2 — ABNORMAL LOW (ref 5–15)
BUN: 17 mg/dL (ref 8–23)
CO2: 32 mmol/L (ref 22–32)
Calcium: 9.1 mg/dL (ref 8.9–10.3)
Chloride: 107 mmol/L (ref 98–111)
Creatinine: 0.78 mg/dL (ref 0.44–1.00)
GFR, Estimated: >60 mL/min (ref >60)
Glucose, Bld: 89 mg/dL (ref 70–99)
Potassium: 4.4 mmol/L (ref 3.5–5.1)
Sodium: 141 mmol/L (ref 135–145)
Total Bilirubin: 0.3 mg/dL (ref 0.0–1.2)
Total Protein: 6.5 g/dL (ref 6.5–8.1)

## 2023-10-04 NOTE — Assessment & Plan Note (Signed)
  This is a pleasant 63 yr old female sent to hematology originally in Nov ( referral dated back to Nov) for anemia associated with folic acid deficiency. 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 No concerns for systemic mastocytosis at this time. - Perform KIT mutational analysis on peripheral blood. - Coordinate with allergist at [facility] for further evaluation if needed. - Maintain current antihistamine regimen with loratadine and montelukast.  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 Current folate levels normal. - Continue folic acid supplementation, adjust frequency based on dietary intake.  Osteopenia Osteopenia managed with Prolia.  - Continue Prolia. - Discuss bone health and falls history with allergist.  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 No link to mastocytosis. Symptoms include slower processing and potential anxiety.  At this time, I do not feel that patient has any hematology needs. Her anemia has resolved. If peripheral blood KIT analysis returns positive,then we will coordinate follow up.

## 2023-10-04 NOTE — Progress Notes (Signed)
 [facility] CONSULT NOTE  Patient Care Team: Tally Joe, MD as PCP - General (Family Medicine)  CHIEF COMPLAINTS/PURPOSE OF CONSULTATION:  Folic acid deficiency and related anemia.  ASSESSMENT & PLAN:  Anemia  This is a pleasant 63 yr old female sent to hematology originally in Nov ( referral dated back to Nov) for anemia associated with folic acid deficiency. 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 No concerns for systemic mastocytosis at this time. - Perform KIT mutational analysis on peripheral blood. - Coordinate with allergist at [facility] for further evaluation if needed. - Maintain current antihistamine regimen with loratadine and montelukast.  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 Current folate levels normal. - Continue folic acid supplementation, adjust frequency based on dietary intake.  Osteopenia Osteopenia managed with Prolia.  - Continue Prolia. - Discuss bone health and falls history with allergist.  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 No link to mastocytosis. Symptoms include slower processing and potential anxiety.  At this time, I do not feel that patient has any hematology needs. Her anemia has resolved. If peripheral blood KIT analysis returns positive,then we will coordinate follow up.  Orders Placed This Encounter  Procedures   Miscellaneous [facility] test (send-out)    Standing Status:   Future    Number of Occurrences:   1    Expected Date:   10/04/2023    Expiration Date:   10/03/2024    Test name / description::   KIT mutation analysis on peripheral blood.   CMP (Cancer Center only)     Standing Status:   Future    Number of Occurrences:   1    Expiration Date:   10/03/2024     HISTORY OF PRESENTING ILLNESS:  Kristin Bass 63 y.o. female is here because of anemia from folic acid deficiency.  Discussed the use of AI scribe software for clinical note transcription with the patient, who gave verbal consent to proceed.  History of Present Illness         Kristin Bass is a 63 year old female who presents for hematology evaluation. She was referred by her gynecologist for anemia secondary to folic acid deficiency.  She was diagnosed with anemia secondary to folic acid deficiency in November, at which time her folate levels were low. Folic acid supplementation was initiated, and subsequent labs in January showed normalized folate levels with no evidence of anemia. She continues to take folic acid, which has been effective in maintaining normal levels.  She has a history of elevated tryptase levels, initially investigated following severe allergic reactions, including an anaphylactic reaction to the COVID vaccine that required hospitalization. During these episodes, she experiences fainting spells and severe itching. Her tryptase levels have been elevated for several years, typically ranging from 17 to 18. She is managed with loratadine and Singulair and carries an epinephrine injection for emergencies.  She has osteopenia and has experienced multiple falls, which she attributes to a gait issue due to having one leg longer than the other. She is currently receiving Prolia injections to manage her bone density issues.  She has a family history of genetic abnormalities, as her sister has a B cell deficiency and a chromosome abnormality known as P1611, which is associated with  intellectual disabilities and learning difficulties. She herself has been tested and found to have this chromosomal duplication, which she reports causes her to process information more slowly.  She has a history  of ADHD and has been on medication, though she is uncertain of its effectiveness. She also reports a history of abnormal pregnancies, including the loss of two sets of twins, and wonders if this could be related to her folic acid deficiency.  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 No new gastrointestinal symptoms or skin rashes.  All other systems were reviewed with the patient and are negative.  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   History of degenerative disc disease    mild   HOH (hard of hearing)    left   Infertility, female    Mild scoliosis    Myofascial pain syndrome    Osteopenia    Precancerous lesion    Sleep apnea    Urticaria    Venous insufficiency    Wrist fracture     SURGICAL HISTORY: Past Surgical History:  Procedure Laterality Date   BREAST CYST EXCISION Left    CYSTECTOMY     from neck   DILATION AND CURETTAGE OF UTERUS     times 2   FLEXOR TENOTOMY  Right 12/03/2022   Procedure: RIGHT BRACHIORADIALIS RELEASE;  Surgeon: Betha Loa, MD;  Location: [facility];  Service: Orthopedics;  Laterality: Right;   FOOT SURGERY Left 11/11   hammer toe & plantar fascitis   MOUTH SURGERY     bone graft   OPEN REDUCTION INTERNAL FIXATION (ORIF) DISTAL RADIAL FRACTURE Right 12/03/2022   Procedure: OPEN REDUCTION INTERNAL FIXATION (ORIF) RIGHT DISTAL RADIUS FRACTURE;  Surgeon: Betha Loa, MD;  Location: [facility];  Service: Orthopedics;  Laterality: Right;  75 MIN   TONSILLECTOMY     VEIN LIGATION AND STRIPPING      SOCIAL HISTORY: Social History   Socioeconomic History   Marital status: Married    Spouse name: Edwin   Number of children: 1   Years of education: AA   Highest education level:  Not on file  Occupational History   Occupation: [employer]  Tobacco Use   Smoking status: Former    Types: Cigarettes   Smokeless tobacco: Never  Vaping Use   Vaping status: Never Used  Substance and Sexual Activity   Alcohol use: No   Drug use: No   Sexual activity: Not Currently    Partners: Male    Birth control/protection: Post-menopausal    Comment: husband vasectomy  Other Topics Concern   Not on file  Social History Narrative   Lives at home with husband, Dorma Russell.   Caffeine use:  2 cups coffee per day   Are you right handed or left handed?    Are you currently employed ? 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   Who lives with you? husband   What type of home do you live in: 1 story or 2 story? one       Social Drivers of Corporate investment banker Strain: Not on file  Food Insecurity: No Food Insecurity (  09/04/2021)   Received from [facility], [facility]   Hunger Vital Sign    Worried About Running Out of Food in the Last Year: Never true    Ran Out of Food in the Last Year: Never true  Transportation Needs: Not on file  Physical Activity: Not on file  Stress: Not on file  Social Connections: Unknown (11/22/2021)   Received from [facility], [facility]   Social Network    Social Network: Not on file  Intimate Partner Violence: Unknown (10/20/2021)   Received from [facility], [facility]   HITS    Physically Hurt: Not on file    Insult or Talk Down To: Not on file    Threaten Physical Harm: Not on file    Scream or Curse: Not on file    FAMILY HISTORY: Family History  Problem Relation Age of Onset   Colon cancer Mother    Hypertension Father    Anxiety disorder Father    Diabetes Maternal Grandfather    Hypertension Maternal Grandmother    COPD Maternal Aunt    Stroke Maternal Aunt    Lung cancer Maternal Aunt    Immunodeficiency Sister    Allergic rhinitis Neg Hx    Angioedema Neg  Hx    Asthma Neg Hx    Atopy Neg Hx    Eczema Neg Hx    Urticaria Neg Hx     ALLERGIES:  is allergic to other, ciprofibrate, ciprofloxacin, covid-19 mrna vacc (moderna), macrobid [nitrofurantoin], and septra [sulfamethoxazole-trimethoprim].  MEDICATIONS:  Current Outpatient Medications  Medication Sig Dispense Refill   atomoxetine (STRATTERA) 25 MG capsule Take 50 mg by mouth daily.     Bioflavonoid Products (BIOFLEX PO) Take by mouth.     Calcium Carbonate-Vitamin D (CALCIUM + D PO) Take 1 tablet by mouth daily.     DICLOFENAC PO Take by mouth daily.     Docusate Calcium (STOOL SOFTENER PO) Apply 3 capsules topically at bedtime.      EPINEPHrine 0.3 mg/0.3 mL IJ SOAJ injection Inject 0.3 mg into the muscle as needed for anaphylaxis. (Patient not taking: Reported on 05/19/2023) 2 each 1   estradiol (ESTRACE VAGINAL) 0.1 MG/GM vaginal cream Place 1 Applicatorful vaginally at bedtime. Rub a pea size amount into the vagina at bedtime for 3 weeks then three times a week thereafter 42.5 g 12   gabapentin (NEURONTIN) 800 MG tablet Take 800 mg by mouth 3 (three) times daily.     ibuprofen (ADVIL) 200 MG tablet Take 200 mg by mouth every 6 (six) hours as needed for headache or moderate pain.     LORATADINE PO Take by mouth. Non Drowsy     Magnesium 250 MG TABS 1 tablet with a meal     montelukast (SINGULAIR) 10 MG tablet Take 1 tablet by mouth daily.     Omega-3 Fatty Acids (FISH OIL PO) Take 2 capsules by mouth every evening.      TURMERIC PO Take by mouth daily.     venlafaxine XR (EFFEXOR-XR) 150 MG 24 hr capsule Take 150 mg by mouth daily.   0   Current Facility-Administered Medications  Medication Dose Route Frequency Provider Last Rate Last Admin   denosumab (PROLIA) injection 60 mg  60 mg Subcutaneous Once        [START ON 10/13/2023] denosumab (PROLIA) injection 60 mg  60 mg Subcutaneous Once [name], MD       Romosozumab-aqqg (EVENITY) 105 MG/1. injection 210 mg  210  mg Subcutaneous Once          PHYSICAL EXAMINATION: ECOG PERFORMANCE STATUS: 0 - Asymptomatic  Vitals:   10/04/23 1059  BP: 131/78  Pulse: 75  Resp: 16  Temp: 98 F (36.7 C)  SpO2: 100%   Filed Weights   10/04/23 1059  Weight: 187 lb 1.6 oz (84.9 kg)    GENERAL:alert, no distress and comfortable SKIN: skin color, texture, turgor are normal, no rashes or significant lesions. Darier sign neg EYES: normal, conjunctiva are pink and non-injected, sclera clear NECK: supple, thyroid normal size, non-tender, without nodularity LYMPH:  no palpable lymphadenopathy in the cervical, axillary  LUNGS: clear to auscultation and percussion with normal breathing effort HEART: regular rate & rhythm and no murmurs and no lower extremity edema ABDOMEN:abdomen soft, non-tender and normal bowel sounds Musculoskeletal:no cyanosis of digits and no clubbing  PSYCH: alert & oriented x 3 with fluent speech NEURO: no focal motor/sensory deficits  LABORATORY DATA:  I have reviewed the data as listed Lab Results  Component Value Date   WBC 5.3 08/05/2023   HGB 13.5 08/05/2023   HCT 41.9 08/05/2023   MCV 97.9 08/05/2023   PLT 230 08/05/2023     Chemistry      Component Value Date/Time   NA 141 10/04/2023 1206   NA 141 05/05/2019 1641   K 4.4 10/04/2023 1206   CL 107 10/04/2023 1206   CO2 32 10/04/2023 1206   BUN 17 10/04/2023 1206   BUN 20 05/05/2019 1641   CREATININE 0.78 10/04/2023 1206   CREATININE 0.70 04/04/2014 0908      Component Value Date/Time   CALCIUM 9.1 10/04/2023 1206   ALKPHOS 62 10/04/2023 1206   AST 19 10/04/2023 1206   ALT 17 10/04/2023 1206   BILITOT 0.3 10/04/2023 1206       RADIOGRAPHIC STUDIES: I have personally reviewed the radiological images as listed and agreed with the findings in the report. No results found.  All questions were answered. The patient knows to call the clinic with any problems, questions or concerns. I spent 45 minutes in the care of  this patient including H and P, review of records, counseling and coordination of care. 25 min spent face to face     Rachel Moulds, MD 10/04/2023 2:55 PM

## 2023-10-05 LAB — MISC LABCORP TEST (SEND OUT): Labcorp test code: 478588691

## 2023-10-13 ENCOUNTER — Ambulatory Visit

## 2023-10-14 ENCOUNTER — Ambulatory Visit

## 2023-11-02 ENCOUNTER — Ambulatory Visit (INDEPENDENT_AMBULATORY_CARE_PROVIDER_SITE_OTHER)

## 2023-11-02 DIAGNOSIS — M810 Age-related osteoporosis without current pathological fracture: Secondary | ICD-10-CM

## 2023-11-06 ENCOUNTER — Emergency Department (HOSPITAL_COMMUNITY)
Admission: EM | Admit: 2023-11-06 | Discharge: 2023-11-06 | Disposition: A | Discharge status: Home / Self Care | Attending: Emergency Medicine | Admitting: Emergency Medicine

## 2023-11-06 ENCOUNTER — Other Ambulatory Visit: Payer: Self-pay

## 2023-11-06 DIAGNOSIS — T7840XA Allergy, unspecified, initial encounter: Secondary | ICD-10-CM | POA: Diagnosis not present

## 2023-11-06 DIAGNOSIS — L299 Pruritus, unspecified: Secondary | ICD-10-CM | POA: Diagnosis present

## 2023-11-06 MED ORDER — EPINEPHRINE 0.3 MG/0.3ML IJ SOAJ: 0.3000 mg | INTRAMUSCULAR | 1 refills | Status: AC | PRN

## 2023-11-06 NOTE — ED Provider Notes (Signed)
 [facility] EMERGENCY DEPARTMENT AT [facility] Provider Note   CSN: [id number] Arrival date & time: 11/06/23  4098     History  Chief Complaint  Patient presents with   Allergic Reaction    Kristin Bass is a 63 y.o. female.  Patient to ED after waking around 4:00 am with symptoms of generalized itching without rash. History of alpha trytasemia deficiency causing mild to severe allergic reactions. No SOB or oral swelling this morning. She used an EpiPen  autoinjector around 4:30 and called EMS. No nausea or vomiting. She has some residual itching in her feet currently   Allergic Reaction      Home Medications Prior to Admission medications   Medication Sig Start Date End Date Taking? Authorizing Provider  atomoxetine (STRATTERA) 25 MG capsule Take 50 mg by mouth daily.    [provider]  Bioflavonoid Products (BIOFLEX PO) Take by mouth.    [provider]  Calcium Carbonate-Vitamin D  (CALCIUM + D PO) Take 1 tablet by mouth daily.    [provider]  DICLOFENAC PO Take by mouth daily.    [provider]  Docusate Calcium (STOOL SOFTENER PO) Apply 3 capsules topically at bedtime.     [provider]  EPINEPHrine  0.3 mg/0.3 mL IJ SOAJ injection Inject 0.3 mg into the muscle as needed for anaphylaxis. 11/06/23   Mandy Second, PA-C  estradiol  (ESTRACE  VAGINAL) 0.1 MG/GM vaginal cream Place 1 Applicatorful vaginally at bedtime. Rub a pea size amount into the vagina at bedtime for 3 weeks then three times a week thereafter 05/28/23   Reinaldo Caras, MD  gabapentin  (NEURONTIN ) 800 MG tablet Take 800 mg by mouth 3 (three) times daily.    [provider]  ibuprofen  (ADVIL ) 200 MG tablet Take 200 mg by mouth every 6 (six) hours as needed for headache or moderate pain.    [provider]  LORATADINE PO Take by mouth. Non Drowsy    [provider]  Magnesium 250 MG TABS 1 tablet with a meal    [provider]  montelukast (SINGULAIR) 10 MG tablet Take 1 tablet by mouth daily. 09/16/21   [provider]  Omega-3 Fatty Acids (FISH OIL PO) Take 2 capsules by mouth every evening.     [provider]  TURMERIC PO Take by mouth daily.    [provider]  venlafaxine XR (EFFEXOR-XR) 150 MG 24 hr capsule Take 150 mg by mouth daily.  12/28/16   [provider]      Allergies    Other, Ciprofibrate, Ciprofloxacin , Covid-19 mrna vacc (moderna), Macrobid [nitrofurantoin], and Septra [sulfamethoxazole-trimethoprim]    Review of Systems   Review of Systems  Physical Exam Updated Vital Signs BP (!) 141/96   Pulse 75   Temp 98.8 F (37.1 C) (Oral)   Resp 16   LMP 01/18/2012   SpO2 99%  Physical Exam Constitutional:      Appearance: She is well-developed.  HENT:     Head: Normocephalic.     Mouth/Throat:     Mouth: Mucous membranes are moist.     Pharynx: Oropharynx is clear.     Comments: Voice clear, no hoarseness Cardiovascular:     Rate and Rhythm: Normal rate and regular rhythm.     Heart sounds: No murmur heard. Pulmonary:     Effort: Pulmonary effort is normal.     Breath sounds: Normal breath sounds. No wheezing, rhonchi or rales.  Abdominal:  Palpations: Abdomen is soft.     Tenderness: There is no abdominal tenderness. There is no guarding or rebound.  Musculoskeletal:        General: No swelling. Normal range of motion.     Cervical back: Normal range of motion and neck supple.  Skin:    General: Skin is warm and dry.     Findings: No erythema or rash.  Neurological:     General: No focal deficit present.     Mental Status: She is alert and oriented to person, place, and time.     ED Results / Procedures / Treatments   Labs (all labs ordered are listed, but only abnormal results are displayed) Labs Reviewed - No data to display  EKG None  Radiology No results found.  Procedures Procedures    Medications  Ordered in ED Medications - No data to display  ED Course/ Medical Decision Making/ A&P                                 Medical Decision Making This patient presents to the ED for concern of allergic reaction, this involves an extensive number of treatment options, and is a complaint that carries with it a high risk of complications and morbidity.  The differential diagnosis includes anaphylaxis, other causes of pruritus: fungal or bacterial infection, localized reaction,    Co morbidities that complicate the patient evaluation  H/o alpha tryptasemia,    Additional history obtained:  Additional history and/or information obtained from chart review, notable for n/a   Lab Tests:  I Ordered, and personally interpreted labs.  The pertinent results include:  n/a    Imaging Studies ordered:  I ordered imaging studies including n/a I independently visualized and interpreted imaging which showed n/a I agree with the radiologist interpretation   Cardiac Monitoring:  The patient was maintained on a cardiac monitor.  I personally viewed and interpreted the cardiac monitored which showed an underlying rhythm of: n/a    Test Considered:  N/a   Critical Interventions:  N/a   Consultations Obtained:  I requested consultation with the n/a,  and discussed lab and imaging findings as well as pertinent plan - they recommend: n/a   Problem List / ED Course:  H/o allergic type reactions secondary to alpha tryptasemia Used EpiPen  at 4:30 am - symptoms mostly resolved now Will observe for another hour Anticipate discharge at that time.   8:30 - she is asymptomatic.   She accidentally injected her left thumb with the EpiPen . The thumb has a small bruise to pad. No swelling. Normal sensation.  Stable for discharge.    Reevaluation:  After the interventions noted above, I reevaluated the patient and found that they have :improved   Social Determinants of  Health:  Nonsmoker   Disposition:  After consideration of the diagnostic results and the patients response to treatment, I feel that the patient would benefit from discharge home.   Risk Prescription drug management.           Final Clinical Impression(s) / ED Diagnoses Final diagnoses:  Allergic reaction, initial encounter    Rx / DC Orders ED Discharge Orders          Ordered    EPINEPHrine  0.3 mg/0.3 mL IJ SOAJ injection  As needed        11/06/23 0839  Mandy Second, PA-C 11/06/23 0848    Rosealee Concha, MD 11/06/23 [phone]

## 2023-11-06 NOTE — Discharge Instructions (Signed)
 Follow up with your doctor as needed.  If you develop any signs or symptoms of allergic reaction, return to the emergency department for evaluation.

## 2023-11-06 NOTE — ED Triage Notes (Signed)
 Pt BIB [facility] from home. Pt Hx alpha tryptasemia. Pt reports having reaction and waking up feeling like she was tingling all over. Pt reports feeling like she would pass out. Pt reports using epi injector but accidentally getting herself in the thumb. Upon arrival pt symptoms improved.  130/62 70hr 95% RA

## 2023-11-09 ENCOUNTER — Telehealth: Payer: Self-pay | Admitting: *Deleted

## 2023-11-09 ENCOUNTER — Encounter: Payer: Self-pay | Admitting: *Deleted

## 2023-11-09 ENCOUNTER — Encounter: Payer: Self-pay | Admitting: Obstetrics and Gynecology

## 2023-11-09 NOTE — Telephone Encounter (Signed)
 Call to patient. Patient received first prolia  injection on 11/02/23. Patient states on Thursday she was out shopping and her lower calf's were on fire and "screaming." Broke both feet years ago and unsure if that is related. Woke up Friday morning at 4 and her lower calf's and feet were still "on fire" and she was also experiencing dizziness and lightheadedness. She later that day called EMS and used her epi-pen because she felt "out of it." Had the epi pen upside down, so the medicine went in her thumb versus her thigh. EMS was concerned and sent her to the ER where she was monitored for a few hours and sent home. Patient states she is still experiencing tingly calf's and feet today and it is driving her crazy. RN advised would review with Dr. Tia Flowers, but would likely need to follow up with PCP for further evaluation. Patient agreeable. Advised would return call with recommendations from Dr. Tia Flowers.   Routing to provider to review and advise.

## 2023-11-09 NOTE — Telephone Encounter (Signed)
 Kristin Bass  P [facility] (supporting Reinaldo Caras, MD)3 hours ago (5:38 AM)    Hello,  Friday I called EMS because i was having a reaction from possibly the shot. I used my epi pen because I was experiencing feelings of dizziness, and I felt like I was going to pass out. I also have increased burning and tingling in my legs and feet. They are traveling up my legs this morning. It must be from the shot. I went shopping for a short time last Thursday and I was so miserable with my feet and lower legs and I couldn't wait to come back home.    I wonder if my calcium levels are high enough. I am feeling the burning and tingling in my lower legs as soon as I wake up. My immunologist said this was not an anaphylactic reaction and the epi pen wasn't necessary to use.    I can't stop the burning sensation in my lower legs and the tingling in my feet. These sensations are increasing and they wake me up now.    Please advise   Thank you,   Kristin Bass

## 2023-11-09 NOTE — Telephone Encounter (Signed)
Message left to return call to [place] at [phone].

## 2023-11-21 ENCOUNTER — Encounter: Payer: Self-pay | Admitting: Obstetrics and Gynecology

## 2023-11-22 NOTE — Telephone Encounter (Signed)
 Routing to Dr. Tia Flowers and Chick Cotton  Encounter closed.

## 2023-11-22 NOTE — Telephone Encounter (Signed)
 Also see open encounter dated 11/09/23.

## 2024-04-12 ENCOUNTER — Other Ambulatory Visit: Payer: Self-pay | Admitting: Medical Genetics

## 2024-05-04 ENCOUNTER — Other Ambulatory Visit

## 2024-05-04 DIAGNOSIS — Z006 Encounter for examination for normal comparison and control in clinical research program: Secondary | ICD-10-CM

## 2024-05-12 LAB — GENECONNECT MOLECULAR SCREEN: Genetic Analysis Overall Interpretation: NEGATIVE

## 2024-05-23 ENCOUNTER — Telehealth: Payer: Self-pay | Admitting: *Deleted

## 2024-05-23 DIAGNOSIS — M8580 Other specified disorders of bone density and structure, unspecified site: Secondary | ICD-10-CM

## 2024-05-23 NOTE — Telephone Encounter (Signed)
 Patient scheduled for 12/18 with EB for aex and flu vaccine.

## 2024-05-23 NOTE — Telephone Encounter (Signed)
 Patient left message on triage line 05/22/24, requesting to schedule AEX, flu vaccine and Prolia  injection.   Last AEX 05/19/23 -EB Last Prolia  injection received on 11/02/23, patient reported concerns after Prolia  injection, was f/u with PCP.   Recommend updated AEX with flu vaccine, can further discuss Prolia  and update benefits. Outcome of f/u of symptoms with PCP?   Left message to call [facility] Triage at [phone], option 4.

## 2024-05-25 NOTE — Telephone Encounter (Signed)
 Reviewed with Dr. Glennon. Will wait until appointment on 12/18 to decide about continuing Prolia .   Encounter closed.

## 2024-05-30 ENCOUNTER — Telehealth: Payer: Self-pay

## 2024-05-30 NOTE — Telephone Encounter (Signed)
 Patient called stating that she is going out of town on the 18th of November. She is wanting to know if she can have the High does flu vaccine even though she is only 63. Please advise.

## 2024-05-30 NOTE — Telephone Encounter (Signed)
 Leftt message advising patient to contact her pcp to get flu vaccine. Per Dr. Majel recommendations.

## 2024-06-01 NOTE — Telephone Encounter (Signed)
 Patient left voicemail requesting a call back in regards to scheduling Prolia . Had to cancel appointment on 07/16/24 due to being out of town.    Message left to return call to [place] at [phone] at [phone]. Advised patient our office is closing at 3pm, but could return call at 8 in the morning.

## 2024-06-07 ENCOUNTER — Telehealth: Payer: Self-pay

## 2024-06-07 ENCOUNTER — Other Ambulatory Visit (HOSPITAL_COMMUNITY): Payer: Self-pay

## 2024-06-07 MED ORDER — DENOSUMAB 60 MG/ML ~~LOC~~ SOSY: 60.0000 mg | PREFILLED_SYRINGE | SUBCUTANEOUS | Status: AC

## 2024-06-07 NOTE — Telephone Encounter (Signed)
 Buy/Bill (Office supplied medication)  Out-of-pocket cost due at time of clinic visit: $438  Number of injection/visits approved: ---  Primary: [employer] Co-insurance: 25% Admin fee co-insurance: 25%  Secondary: --- Co-insurance:  Admin fee co-insurance:   Medical Benefit Details: Date Benefits were checked: 06/07/24 Deductible: $50 Met of $50 Required/ Coinsurance: 25%/ Admin Fee: 25%  Prior Auth: N/A PA# Expiration Date:   # of doses approved: -----------------------------------------------------------------------  Patient NOT eligible for Copay Card. Copay Card can make patient's cost as little as $25. Link to apply: [email or web address]://[email or web address]  ** This summary of benefits is an estimation of the patient's out-of-pocket cost. Exact cost may very based on individual plan coverage.

## 2024-06-07 NOTE — Telephone Encounter (Signed)
 Call to patient. Patient states PCP told her the symptoms she experienced back in the spring following Prolia  had nothing to do with the injection, so patient desires to proceed. Advised med would be sent to [facility] for benefits verification and message sent to appointments to reschedule annual exam. Patient agreeable.  CAM order placed for Prolia .   Encounter closed.

## 2024-06-07 NOTE — Telephone Encounter (Signed)
 Prolia VOB initiated via [email or web address]  Next Prolia inj DUE: NOW

## 2024-06-07 NOTE — Addendum Note (Signed)
 Addended by: MELITON PERKINS T on: 06/07/2024 12:17 PM   Modules accepted: Orders

## 2024-06-07 NOTE — Telephone Encounter (Signed)
 Kristin Bass

## 2024-06-09 LAB — HM MAMMOGRAPHY

## 2024-06-12 ENCOUNTER — Encounter: Payer: Self-pay | Admitting: Obstetrics and Gynecology

## 2024-06-12 NOTE — Telephone Encounter (Signed)
 See referral

## 2024-06-13 ENCOUNTER — Ambulatory Visit: Payer: Self-pay | Admitting: Obstetrics and Gynecology

## 2024-07-06 ENCOUNTER — Ambulatory Visit: Admitting: Obstetrics and Gynecology

## 2024-08-29 ENCOUNTER — Ambulatory Visit: Admitting: Obstetrics and Gynecology
